# Patient Record
Sex: Female | Born: 1935 | Race: White | Hispanic: No | Smoking: Never smoker
Health system: Southern US, Community
[De-identification: ages and names within clinical notes are randomized; demographics above are authoritative.]

## PROBLEM LIST (undated history)

## (undated) DIAGNOSIS — H269 Unspecified cataract: Secondary | ICD-10-CM

## (undated) DIAGNOSIS — I4891 Unspecified atrial fibrillation: Secondary | ICD-10-CM

## (undated) DIAGNOSIS — I499 Cardiac arrhythmia, unspecified: Secondary | ICD-10-CM

## (undated) DIAGNOSIS — I10 Essential (primary) hypertension: Secondary | ICD-10-CM

## (undated) HISTORY — DX: Unspecified atrial fibrillation: I48.91

## (undated) HISTORY — DX: Unspecified cataract: H26.9

## (undated) HISTORY — PX: TONSILLECTOMY: SUR1361

## (undated) HISTORY — PX: HERNIA REPAIR: SHX51

## (undated) HISTORY — DX: Essential (primary) hypertension: I10

## (undated) HISTORY — PX: EYE SURGERY: SHX253

## (undated) HISTORY — PX: KIDNEY STONE SURGERY: SHX686

## (undated) HISTORY — PX: BREAST BIOPSY: SHX20

---

## 1998-10-15 HISTORY — PX: CATARACT EXTRACTION, BILATERAL: SHX1313

## 1999-06-12 ENCOUNTER — Other Ambulatory Visit: Admission: RE | Admit: 1999-06-12 | Discharge: 1999-06-12 | Payer: Self-pay | Admitting: Obstetrics and Gynecology

## 2002-01-06 ENCOUNTER — Other Ambulatory Visit: Admission: RE | Admit: 2002-01-06 | Discharge: 2002-01-06 | Payer: Self-pay | Admitting: Obstetrics and Gynecology

## 2004-06-13 ENCOUNTER — Other Ambulatory Visit: Admission: RE | Admit: 2004-06-13 | Discharge: 2004-06-13 | Payer: Self-pay | Admitting: Obstetrics and Gynecology

## 2004-12-22 ENCOUNTER — Ambulatory Visit: Payer: Self-pay | Admitting: Internal Medicine

## 2005-01-09 ENCOUNTER — Ambulatory Visit: Payer: Self-pay | Admitting: Internal Medicine

## 2005-01-23 ENCOUNTER — Ambulatory Visit: Payer: Self-pay

## 2005-06-04 ENCOUNTER — Ambulatory Visit: Payer: Self-pay | Admitting: Internal Medicine

## 2008-07-23 ENCOUNTER — Encounter: Payer: Self-pay | Admitting: Internal Medicine

## 2008-07-27 ENCOUNTER — Ambulatory Visit: Payer: Self-pay | Admitting: Internal Medicine

## 2008-07-28 ENCOUNTER — Encounter (INDEPENDENT_AMBULATORY_CARE_PROVIDER_SITE_OTHER): Payer: Self-pay | Admitting: *Deleted

## 2008-07-29 ENCOUNTER — Telehealth (INDEPENDENT_AMBULATORY_CARE_PROVIDER_SITE_OTHER): Payer: Self-pay | Admitting: *Deleted

## 2010-10-18 ENCOUNTER — Encounter: Payer: Self-pay | Admitting: Internal Medicine

## 2010-10-19 ENCOUNTER — Ambulatory Visit
Admission: RE | Admit: 2010-10-19 | Discharge: 2010-10-19 | Payer: Self-pay | Source: Home / Self Care | Attending: Internal Medicine | Admitting: Internal Medicine

## 2010-10-19 DIAGNOSIS — J069 Acute upper respiratory infection, unspecified: Secondary | ICD-10-CM | POA: Insufficient documentation

## 2010-10-19 LAB — CONVERTED CEMR LAB: Cholesterol, target level: 200 mg/dL

## 2010-11-16 NOTE — Assessment & Plan Note (Signed)
Summary: cough//congestion//lch   Vital Signs:  Patient profile:   77 year old female Weight:      133 pounds BMI:     26.21 O2 Sat:      97 % on Room air Temp:     98.0 degrees F oral Pulse rate:   106 / minute Resp:     14 per minute BP sitting:   130 / 82  (left arm) Cuff size:   large  Vitals Entered By: Shonna Chock CMA (October 19, 2010 10:24 AM)  O2 Flow:  Room air CC: Cough and congestion since last Thursday and wheezing , URI symptoms, Lipid Management   CC:  Cough and congestion since last Thursday and wheezing , URI symptoms, and Lipid Management.  Lipid Management History:      Positive NCEP/ATP III risk factors include female age 34 years old or older and hypertension.  Negative NCEP/ATP III risk factors include non-diabetic, no family history for ischemic heart disease, non-tobacco-user status, no ASHD (atherosclerotic heart disease), no prior stroke/TIA, no peripheral vascular disease, and no history of aortic aneurysm.     Preventive Screening-Counseling & Management  Alcohol-Tobacco     Alcohol drinks/day: 0     Smoking Status: never  Caffeine-Diet-Exercise     Caffeine use/day: tea 1-2 daily  Hep-HIV-STD-Contraception     Dental Visit-last 6 months no     Dental Care Counseling: discussed     Sun Exposure-Excessive: no  Safety-Violence-Falls     Seat Belt Use: yes     Smoke Detectors: yes      Blood Transfusions:  no.        Travel History:  [place] 2009.    Allergies: 1)  ! Pcn 2)  ! Sulfa  Social History: Caffeine use/day:  tea 1-2 daily Dental Care w/in 6 mos.:  no Sun Exposure-Excessive:  no Seat Belt Use:  yes Blood Transfusions:  no  Review of Systems  The patient denies anorexia, vision loss, decreased hearing, abdominal pain, melena, hematochezia, severe indigestion/heartburn, hematuria, unusual weight change, abnormal bleeding, enlarged lymph nodes, and angioedema.     Impression & Recommendations:  Lipid Assessment/Plan:     Based on NCEP/ATP III, the patient's risk factor category is "2 or more risk factors and a calculated 10 year CAD risk of > 20%".  The patient's lipid goals are as follows: Total cholesterol goal is 200; LDL cholesterol goal is 130; HDL cholesterol goal is 40; Triglyceride goal is 150.

## 2010-11-16 NOTE — Assessment & Plan Note (Signed)
Summary: URI/scm   Vital Signs:  Patient profile:   77 year old female Weight:      133 pounds O2 Sat:      97 % on Room air Temp:     97 degrees F Pulse rate:   106 / minute BP sitting:   130 / 82  O2 Flow:  Room air  CC:  URI symptoms.  History of Present Illness:      This is an 77 year old woman who presents with  RTI  symptoms; onset 10/10/2010 as pain below R ear..  The patient now  reports purulent nasal discharge and dry cough, but denies earache.  Associated symptoms include wheezing over upper airway.  The patient denies fever.  The patient also reports intermittent frontal headache.  Risk factors for Strep sinusitis include tooth pain and tender adenopathy. No PMH of asthma.   Allergies: 1)  ! Pcn 2)  ! Sulfa  Physical Exam  General:  in no acute distress; alert,appropriate and cooperative throughout examination Ears:  External ear exam shows no significant lesions or deformities.  Otoscopic examination reveals clear canals, tympanic membranes are intact bilaterally without bulging, retraction, inflammation or discharge. Hearing is grossly normal bilaterally. Nose:  External nasal examination shows no deformity or inflammation. Nasal mucosa are  dry  without lesions or exudates.Slight hyponasal speech  Mouth:  Oral mucosa and oropharynx without lesions or exudates.  Teeth in good repair. Lungs:  Normal respiratory effort, chest expands symmetrically. Lungs :scattered musical rhonchi w/o  wheezes. Heart:  regular rhythm, no murmur, no rub, no JVD, and tachycardia.   Cervical Nodes:  No lymphadenopathy noted Axillary Nodes:  No palpable lymphadenopathy   Impression & Recommendations:  Problem # 1:  BRONCHITIS-ACUTE (ICD-466.0)  RAD component  Her updated medication list for this problem includes:    Clarithromycin 500 Mg Xr24h-tab (Clarithromycin) .Marland Kitchen... 2 once daily with food    Hydromet 5-1.5 Mg/79ml Syrp (Hydrocodone-homatropine) .Marland Kitchen... 1 tsp every 6 hrs as  needed  Problem # 2:  UPPER RESPIRATORY INFECTION, ACUTE (ICD-465.9)  Her updated medication list for this problem includes:    Hydromet 5-1.5 Mg/49ml Syrp (Hydrocodone-homatropine) .Marland Kitchen... 1 tsp every 6 hrs as needed  Complete Medication List: 1)  Clarithromycin 500 Mg Xr24h-tab (Clarithromycin) .... 2 once daily with food 2)  Hydromet 5-1.5 Mg/19ml Syrp (Hydrocodone-homatropine) .Marland Kitchen.. 1 tsp every 6 hrs as needed 3)  Prednisone 20 Mg Tabs (Prednisone) .Marland Kitchen.. 1 two times a day with meals  Patient Instructions: 1)  Use samples as instructed. 2)  Drink as much NON dairy fluid as you can tolerate for the next few days. Prescriptions: PREDNISONE 20 MG TABS (PREDNISONE) 1 two times a day with meals  #10 x 0   Entered and Authorized by:   Marga Melnick MD   Signed by:   Marga Melnick MD on 10/23/2010   Method used:   Print then Give to Patient   RxID:   [id number] HYDROMET 5-1.5 MG/5ML SYRP (HYDROCODONE-HOMATROPINE) 1 tsp every 6 hrs as needed  #120 cc x 0   Entered and Authorized by:   Marga Melnick MD   Signed by:   Marga Melnick MD on 10/23/2010   Method used:   Print then Give to Patient   RxID:   [id number] CLARITHROMYCIN 500 MG XR24H-TAB (CLARITHROMYCIN) 2 once daily with food  #20 x 0   Entered and Authorized by:   Marga Melnick MD   Signed by:   Marga Melnick MD on 10/23/2010  Method used:   Print then Give to Patient   RxID:   [id number]    Orders Added: 1)  Est. Patient Level III [14782]

## 2011-06-06 ENCOUNTER — Ambulatory Visit (INDEPENDENT_AMBULATORY_CARE_PROVIDER_SITE_OTHER): Payer: Medicare Other | Admitting: Internal Medicine

## 2011-06-06 ENCOUNTER — Encounter: Payer: Self-pay | Admitting: Internal Medicine

## 2011-06-06 VITALS — BP 118/70 | HR 76 | Temp 97.7°F | Wt 134.8 lb

## 2011-06-06 DIAGNOSIS — M79673 Pain in unspecified foot: Secondary | ICD-10-CM

## 2011-06-06 DIAGNOSIS — M79609 Pain in unspecified limb: Secondary | ICD-10-CM

## 2011-06-06 NOTE — Progress Notes (Signed)
  Subjective:    Patient ID: Ashlee Warren, female    DOB: 1928-02-22, 77 y.o.   MRN: [id number]  HPI Extremity pain Location:R foot @ heel Onset:3-4 weeks ago Trigger/injury:no Pain quality:varaible, sharp - burning Pain severity:up to 10 Duration:up to hours but intermittent Radiation:no Exacerbating factors:standing or walking  Treatment/response:ASA with some benefit; ice increased pain Review of systems: Constitutional: no fever, chills, sweats, change in weight  Musculoskeletal:no  muscle cramps or pain; no  joint stiffness, redness, or swelling Skin:no rash, color change Neuro: no :weakness; incontinence (stool/urine); numbness and tingling Heme:no lymphadenopathy; abnormal bruising or bleeding   Past medical history of reflex sympathetic dystrophy in same leg 15 years ago. She states that her bone density was normal, but the last BMP was 15 years ago. She denies any history of fractures      Review of Systems     Objective:   Physical Exam  she is in no acute distress; she is healthy and well-nourished. She appears younger than stated age.  Deep tendon reflexes are equal bilaterally.  She has minor flexion contracture of 1 right finger.  Strength is good in all extremities  The right great toenail is slightly discolored; otherwise nail health is good  No significant skin lesions are present.  There is full range of motion of both feet/ ankles. Pain can be exacerbated by extreme flexion of the right foot.  There is no tenderness over the Achilles tendon ankle she is tender with compression of the right calcaneus.  Homans sign is negative.  Pedal pulses are excellent with no ischemic changes.        Assessment & Plan:  #1 calcaneus pain, "stone bruise" suggested clinically. Rule out tendinitis  #2 reflex sympathetic dystrophy, past medical history of  Plan: See orders and recommendations. Please consider podiatry or orthopedic foot specialist referral if  you are better with glucosamine.

## 2011-06-06 NOTE — Patient Instructions (Signed)
Consider glucosamine sulfate 1500 mg daily for heel symptoms. Take this daily  for 3 months and then leave it off for 2 months. This will rehydrate the cartilages.  Recommended lifestyle interventions for Osteoporosis include calcium 600 mg twice a day  & vitamin D3 supplementation to keep vit D  level @ least 40-60. The usual vitamin D3 dose is 1000 IU daily; but individual dose is determined by annual vitamin D level monitor. Also weight bearing exercise such as  walking 30-45 minutes 3-4  X per week is recommended. [employer]

## 2011-11-28 DIAGNOSIS — M7730 Calcaneal spur, unspecified foot: Secondary | ICD-10-CM | POA: Diagnosis not present

## 2011-11-28 DIAGNOSIS — L03039 Cellulitis of unspecified toe: Secondary | ICD-10-CM | POA: Diagnosis not present

## 2012-01-18 DIAGNOSIS — H26499 Other secondary cataract, unspecified eye: Secondary | ICD-10-CM | POA: Diagnosis not present

## 2012-04-14 DIAGNOSIS — Z1231 Encounter for screening mammogram for malignant neoplasm of breast: Secondary | ICD-10-CM | POA: Diagnosis not present

## 2012-04-16 DIAGNOSIS — N6489 Other specified disorders of breast: Secondary | ICD-10-CM | POA: Diagnosis not present

## 2012-04-23 DIAGNOSIS — L719 Rosacea, unspecified: Secondary | ICD-10-CM | POA: Diagnosis not present

## 2012-04-23 DIAGNOSIS — L259 Unspecified contact dermatitis, unspecified cause: Secondary | ICD-10-CM | POA: Diagnosis not present

## 2012-04-23 DIAGNOSIS — L570 Actinic keratosis: Secondary | ICD-10-CM | POA: Diagnosis not present

## 2012-04-23 DIAGNOSIS — C44621 Squamous cell carcinoma of skin of unspecified upper limb, including shoulder: Secondary | ICD-10-CM | POA: Diagnosis not present

## 2012-04-24 ENCOUNTER — Encounter: Payer: Self-pay | Admitting: Internal Medicine

## 2012-05-27 DIAGNOSIS — Z85828 Personal history of other malignant neoplasm of skin: Secondary | ICD-10-CM | POA: Diagnosis not present

## 2012-09-29 ENCOUNTER — Encounter (HOSPITAL_COMMUNITY): Payer: Self-pay | Admitting: Emergency Medicine

## 2012-09-29 ENCOUNTER — Emergency Department (HOSPITAL_COMMUNITY): Payer: Medicare Other

## 2012-09-29 ENCOUNTER — Ambulatory Visit (INDEPENDENT_AMBULATORY_CARE_PROVIDER_SITE_OTHER): Payer: Medicare Other | Admitting: Internal Medicine

## 2012-09-29 ENCOUNTER — Emergency Department (HOSPITAL_COMMUNITY)
Admission: EM | Admit: 2012-09-29 | Discharge: 2012-09-30 | Disposition: A | Discharge status: Home / Self Care | Payer: Medicare Other | Attending: Emergency Medicine | Admitting: Emergency Medicine

## 2012-09-29 ENCOUNTER — Ambulatory Visit (INDEPENDENT_AMBULATORY_CARE_PROVIDER_SITE_OTHER)
Admission: RE | Admit: 2012-09-29 | Discharge: 2012-09-29 | Disposition: A | Discharge status: Home / Self Care | Payer: Medicare Other | Source: Ambulatory Visit | Attending: Internal Medicine | Admitting: Internal Medicine

## 2012-09-29 ENCOUNTER — Encounter: Payer: Self-pay | Admitting: Internal Medicine

## 2012-09-29 VITALS — BP 126/80 | HR 60 | Temp 97.8°F | Wt 133.8 lb

## 2012-09-29 DIAGNOSIS — J069 Acute upper respiratory infection, unspecified: Secondary | ICD-10-CM | POA: Diagnosis not present

## 2012-09-29 DIAGNOSIS — R0609 Other forms of dyspnea: Secondary | ICD-10-CM | POA: Insufficient documentation

## 2012-09-29 DIAGNOSIS — R05 Cough: Secondary | ICD-10-CM | POA: Diagnosis not present

## 2012-09-29 DIAGNOSIS — R079 Chest pain, unspecified: Secondary | ICD-10-CM | POA: Diagnosis not present

## 2012-09-29 DIAGNOSIS — I4891 Unspecified atrial fibrillation: Secondary | ICD-10-CM | POA: Diagnosis not present

## 2012-09-29 DIAGNOSIS — R071 Chest pain on breathing: Secondary | ICD-10-CM

## 2012-09-29 DIAGNOSIS — I499 Cardiac arrhythmia, unspecified: Secondary | ICD-10-CM | POA: Diagnosis not present

## 2012-09-29 DIAGNOSIS — R0781 Pleurodynia: Secondary | ICD-10-CM

## 2012-09-29 DIAGNOSIS — R059 Cough, unspecified: Secondary | ICD-10-CM | POA: Diagnosis not present

## 2012-09-29 DIAGNOSIS — R0989 Other specified symptoms and signs involving the circulatory and respiratory systems: Secondary | ICD-10-CM | POA: Insufficient documentation

## 2012-09-29 DIAGNOSIS — J8410 Pulmonary fibrosis, unspecified: Secondary | ICD-10-CM | POA: Diagnosis not present

## 2012-09-29 LAB — CBC WITH DIFFERENTIAL/PLATELET
Basophils Absolute: 0 10*3/uL (ref 0.0–0.1)
Lymphocytes Relative: 32 % (ref 12–46)
Lymphs Abs: 3.2 10*3/uL (ref 0.7–4.0)
MCV: 89.8 fL (ref 78.0–100.0)
Neutro Abs: 5.9 10*3/uL (ref 1.7–7.7)
Neutrophils Relative %: 59 % (ref 43–77)
Platelets: 299 10*3/uL (ref 150–400)
RBC: 4.8 MIL/uL (ref 3.87–5.11)
RDW: 12.5 % (ref 11.5–15.5)
WBC: 10 10*3/uL (ref 4.0–10.5)

## 2012-09-29 LAB — COMPREHENSIVE METABOLIC PANEL
ALT: 16 U/L (ref 0–35)
AST: 20 U/L (ref 0–37)
Alkaline Phosphatase: 91 U/L (ref 39–117)
CO2: 25 mEq/L (ref 19–32)
Calcium: 9.9 mg/dL (ref 8.4–10.5)
Chloride: 102 mEq/L (ref 96–112)
GFR calc Af Amer: 65 mL/min — ABNORMAL LOW (ref >90)
GFR calc non Af Amer: 56 mL/min — ABNORMAL LOW (ref >90)
Glucose, Bld: 82 mg/dL (ref 70–99)
Potassium: 4 mEq/L (ref 3.5–5.1)
Sodium: 138 mEq/L (ref 135–145)
Total Bilirubin: 0.3 mg/dL (ref 0.3–1.2)

## 2012-09-29 LAB — D-DIMER, QUANTITATIVE: D-Dimer, Quant: 0.58 ug/mL-FEU — ABNORMAL HIGH (ref 0.00–0.48)

## 2012-09-29 MED ORDER — RIVAROXABAN 20 MG PO TABS: 20.0000 mg | ORAL_TABLET | Freq: Every day | ORAL | Status: DC

## 2012-09-29 MED ORDER — AZITHROMYCIN 250 MG PO TABS: ORAL_TABLET | ORAL | Status: DC

## 2012-09-29 MED ORDER — SODIUM CHLORIDE 0.9 % IV BOLUS (SEPSIS)
1000.0000 mL | Freq: Once | INTRAVENOUS | Status: AC
  Administered 2012-09-29: 1000 mL via INTRAVENOUS

## 2012-09-29 NOTE — ED Provider Notes (Signed)
History     CSN: [id number]  Arrival date & time 09/29/12  2130   First MD Initiated Contact with Patient 09/29/12 2201      No chief complaint on file.   HPI  the patient presents with new discomfort.  The pain is focally in her left lower anterior ribs.  Pain began 3 days ago, subacutely.  Since onset the pain has been intermittent pain is worse with deep inspiration.  There is no other chest pain.  There is mild associated dyspnea.  The patient was seen at clinic today, sent here after elevated D. dimer. On arrival the patient denies any significant ongoing pain beyond her left lower rib pain.  She denies current lightheadedness, confusion, disorientation, dyspnea.  She denies any lower extremity edema, abdominal pain. She is no cardiac history. Notably, earlier today she was told that she has new atrial fibrillation.  Past Medical History  Diagnosis Date  . H/O atrial fibrillation without current medication     Dr Eden Emms    History reviewed. No pertinent past surgical history.  No family history on file.  History  Substance Use Topics  . Smoking status: Never Smoker   . Smokeless tobacco: Not on file  . Alcohol Use: No    OB History    Grav Para Term Preterm Abortions TAB SAB Ect Mult Living                  Review of Systems  Constitutional:       Per HPI, otherwise negative  HENT:       Per HPI, otherwise negative  Eyes: Negative.   Respiratory:       Per HPI, otherwise negative  Cardiovascular:       Per HPI, otherwise negative  Gastrointestinal: Negative for vomiting.  Genitourinary: Negative.   Musculoskeletal:       Per HPI, otherwise negative  Skin: Negative.   Neurological: Negative for syncope.    Allergies  Penicillins and Sulfonamide derivatives  Home Medications   Current Outpatient Rx  Name  Route  Sig  Dispense  Refill  . AZITHROMYCIN 250 MG PO TABS   Oral   Take 250-500 mg by mouth daily. Patient has zpak. Takes 2 tablets together  on day 1, then 1 tablet daily until gone         . RIVAROXABAN 20 MG PO TABS   Oral   Take 20 mg by mouth daily.           BP 162/80  Pulse 71  Temp 97.9 F (36.6 C) (Oral)  Resp 19  SpO2 94%  Physical Exam  Nursing note and vitals reviewed. Constitutional: She is oriented to person, place, and time. She appears well-developed and well-nourished. No distress.  HENT:  Head: Normocephalic and atraumatic.  Eyes: Conjunctivae normal and EOM are normal.  Cardiovascular: An irregularly irregular rhythm present. Tachycardia present.   Pulmonary/Chest: Effort normal and breath sounds normal. No stridor. No respiratory distress.  Abdominal: She exhibits no distension.  Musculoskeletal: She exhibits no edema.  Neurological: She is alert and oriented to person, place, and time. No cranial nerve deficit.  Skin: Skin is warm and dry.  Psychiatric: She has a normal mood and affect.    ED Course  Procedures (including critical care time)   Labs Reviewed  CBC WITH DIFFERENTIAL  COMPREHENSIVE METABOLIC PANEL  TROPONIN I   Dg Chest 2 View  09/29/2012  *RADIOLOGY REPORT*  Clinical Data: Cough and chest  pain.  CHEST - 2 VIEW  Comparison: 07/27/2008.  Findings: Cardiac silhouette, mediastinal and hilar contours are normal.  There are bronchitic changes in the lungs which could suggest bronchitis.  No definite infiltrates or effusions.  The bony thorax is intact  IMPRESSION: Findings suggest bronchitis with streaky areas of atelectasis but no definite infiltrates.   Original Report Authenticated By: Rudie Meyer, M.D.      No diagnosis found.  Cardiac A. fib 110 abnormal Pulse ox 99% room air normal    Date: 09/29/2012  Rate: 111  Rhythm: atrial fibrillation  QRS Axis: left  Intervals: QT prolonged  ST/T Wave abnormalities: nonspecific ST/T changes  Conduction Disutrbances:left anterior fascicular block  Narrative Interpretation:   Old EKG Reviewed: none  available ABNORMAL  PE scan reviewed / interpreted by me MDM  This patient presents with concerns of left-sided chest pain, that is present from his.  Notably, the patient also has new atrial fibrillation.  The patient's rate is appropriate, and she is oriented quite by her primary care physician.  The patient's CT scan, was negative, and she remained in in no distress throughout her ED stay.  Given that she has a followup appointment tomorrow am, 9 hours from now, she is appropriate for discharge with close outpatient followup       Gerhard Munch, MD 09/30/12 [phone]

## 2012-09-29 NOTE — Progress Notes (Signed)
  Subjective:    Patient ID: Ashlee Warren, female    DOB: September 20, 1928, 78 y.o.   MRN: [id number]  HPI  Symptoms began approximately 10 days ago as a "pulling" sensation in the left inferior thorax intermittently with cough or deep breathing. This was associated with a nonproductive cough which has persisted.  She then developed rhinitis which was subsequently associated with yellow nasal discharge.  She treated herself with the Neti pot and leftover prescription cough syrup    Review of Systems She denied associated extrinsic symptoms of itchy, watery eyes or sneezing. She also denies fever, chills, or sweats. There was no associated frontal headache or facial pain with nasal purulence.  The cough has not been associated with wheezing or shortness of breath     Objective:   Physical Exam General appearance:good health ;well nourished; no acute distress or increased work of breathing is present. Appears younger than stated age . No  lymphadenopathy about the head, neck, or axilla noted.   Eyes: No conjunctival inflammation or lid edema is present.   Ears:  External ear exam shows no significant lesions or deformities.  Otoscopic examination reveals clear canals, tympanic membranes are intact bilaterally without bulging, retraction, inflammation or discharge.  Nose:  External nasal examination shows no deformity or inflammation. Nasal mucosa are pink and moist without lesions or exudates. No septal dislocation or deviation.No obstruction to airflow.   Oral exam: Dental hygiene is good; lips and gums are healthy appearing.There is no oropharyngeal erythema or exudate noted. Palatine osteoma  Neck:  No deformities, masses, or tenderness noted.     Heart:  Irregular  rate and  rhythm. Minor flow  Murmur. No click, rub or other extra sounds.   Lungs:Chest clear to auscultation; no wheezes, rhonchi,rales ,or rubs present.No increased work of breathing. Slight guarding intermittently with  inspiration   Abdomen: LUQ tenderness  Extremities:  No cyanosis, edema, or clubbing  noted . Homan's negative   Skin: Warm & dry           Assessment & Plan:  #1 pleuritic chest pain. In context of #4 embolic phenomena must be considered.  #2 purulent nasal secretions  #3 nonproductive cough  #4 irregular rhythm. EKG reveals atrial fibrillation with tachycardia and unifocal extrasystoles. CHADS 2 score is 1.Bleeding risk = 1. No fall risk.  In view of #1 and number 4 ;Xarelto will be  Initiated & cardiology referral made. She is very stoic individual who would tend to minimize symptoms.   Plan: See orders and recommendations

## 2012-09-29 NOTE — ED Notes (Addendum)
PT. RECEIVED A CALL / ADVISED BY PCP TO GO TO ER FOR FURTHER EVALUATION OF POSSIBLE " BLOOD CLOT IN LUNGS " , SEEN AT [facility] TODAY , BLOOD WORK AND CHEST X-RAY DONE AT CLINIC. DENIES SOB OR CHEST PAIN .

## 2012-09-29 NOTE — Patient Instructions (Addendum)
Order for x-rays entered into  the computer; these will be performed at [address] [facility]. across from [facility]. No appointment is necessary.   If you activate My Chart; the results can be released to you as soon as they populate from the lab. If you choose not to use this program; the labs have to be reviewed, copied & mailed   causing a delay in getting the results to you.  To ER if pain persists or is associaled with Warning Signs as discussed.

## 2012-09-30 ENCOUNTER — Encounter: Payer: Self-pay | Admitting: Cardiology

## 2012-09-30 ENCOUNTER — Ambulatory Visit (INDEPENDENT_AMBULATORY_CARE_PROVIDER_SITE_OTHER): Payer: Medicare Other | Admitting: Cardiology

## 2012-09-30 ENCOUNTER — Telehealth: Payer: Self-pay | Admitting: Internal Medicine

## 2012-09-30 ENCOUNTER — Encounter (HOSPITAL_COMMUNITY): Payer: Self-pay | Admitting: Radiology

## 2012-09-30 ENCOUNTER — Ambulatory Visit (HOSPITAL_COMMUNITY): Payer: Medicare Other | Attending: Cardiovascular Disease | Admitting: Radiology

## 2012-09-30 VITALS — BP 146/88 | HR 98 | Ht 62.0 in | Wt 132.0 lb

## 2012-09-30 DIAGNOSIS — I10 Essential (primary) hypertension: Secondary | ICD-10-CM | POA: Insufficient documentation

## 2012-09-30 DIAGNOSIS — I059 Rheumatic mitral valve disease, unspecified: Secondary | ICD-10-CM | POA: Diagnosis not present

## 2012-09-30 DIAGNOSIS — I4891 Unspecified atrial fibrillation: Secondary | ICD-10-CM | POA: Insufficient documentation

## 2012-09-30 LAB — CBC WITH DIFFERENTIAL/PLATELET
Basophils Absolute: 0.1 10*3/uL (ref 0.0–0.1)
Basophils Relative: 0.5 % (ref 0.0–3.0)
Eosinophils Absolute: 0.2 10*3/uL (ref 0.0–0.7)
Eosinophils Relative: 1.9 % (ref 0.0–5.0)
Hemoglobin: 14.1 g/dL (ref 12.0–15.0)
Lymphocytes Relative: 24 % (ref 12.0–46.0)
MCHC: 33 g/dL (ref 30.0–36.0)
Monocytes Absolute: 0.8 10*3/uL (ref 0.1–1.0)
Neutrophils Relative %: 66.1 % (ref 43.0–77.0)
Platelets: 314 10*3/uL (ref 150.0–400.0)
RBC: 4.74 Mil/uL (ref 3.87–5.11)
WBC: 10.5 10*3/uL (ref 4.5–10.5)

## 2012-09-30 LAB — BASIC METABOLIC PANEL
CO2: 26 mEq/L (ref 19–32)
GFR: 56.34 mL/min — ABNORMAL LOW (ref >60.00)
Sodium: 139 mEq/L (ref 135–145)

## 2012-09-30 MED ORDER — IOHEXOL 350 MG/ML SOLN
100.0000 mL | Freq: Once | INTRAVENOUS | Status: AC | PRN
  Administered 2012-09-30: 100 mL via INTRAVENOUS

## 2012-09-30 MED ORDER — METOPROLOL TARTRATE 25 MG PO TABS: 12.5000 mg | ORAL_TABLET | Freq: Two times a day (BID) | ORAL | Status: DC

## 2012-09-30 MED ORDER — RIVAROXABAN 20 MG PO TABS: 20.0000 mg | ORAL_TABLET | Freq: Every day | ORAL | Status: DC

## 2012-09-30 NOTE — Progress Notes (Signed)
   HPI:  The patient is sent over by Dr. Alwyn Ren for evaluation. 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 Her major complaint was that of some discomfort in the left flank associated with taking a deep breath in. She had a d-dimer performed, and this was elevated and she was therefore sent to the emergency room by Dr. Alvera Novel. A CT scan was performed and revealed no evidence of pulmonary embolus. There was some fibrotic changes in the lungs. Her EKG yesterday demonstrated evidence of atrial fibrillation, and as a result of that she was started on Xarelto by Dr. Alwyn Ren.  She denies any recent bleeding.  Never had a colon exam in the past.  Has been evaluated by Dr. Eden Emms with Holter in the past.  Denies chest pain per se.  No exertional symptoms.     Current Outpatient Prescriptions  Medication Sig Dispense Refill  . azithromycin (ZITHROMAX) 250 MG tablet Take 250-500 mg by mouth daily. Patient has zpak. Takes 2 tablets together on day 1, then 1 tablet daily until gone      . Rivaroxaban (XARELTO) 20 MG TABS Take 20 mg by mouth daily.        Allergies  Allergen Reactions  . Penicillins     Rash R thumb  . Sulfonamide Derivatives     rash    Past Medical History  Diagnosis Date  . H/O atrial fibrillation without current medication     Dr Eden Emms    No past surgical history on file.  No family history on file.  History   Social History  . Marital Status: Single    Spouse Name: N/A    Number of Children: N/A  . Years of Education: N/A   Occupational History  . Not on file.   Social History Main Topics  . Smoking status: Never Smoker   . Smokeless tobacco: Not on file  . Alcohol Use: No  . Drug Use: No  . Sexually Active: Not on file   Other Topics Concern  . Not on file   Social History Narrative  . No narrative on file    ROS: Please see the HPI.  No prior colon.  No history of bleeding.  No increased SOB as of late.  Recent sinus infection.    PHYSICAL  EXAM:  BP 146/88  Pulse 98  Ht 5\' 2"  (1.575 m)  Wt 132 lb (59.875 kg)  BMI 24.14 kg/m2  SpO2 99%  General: Well developed, well nourished, in no acute distress. Head:  Normocephalic and atraumatic. Neck: no JVD.  No carotid bruits.  Some tenderness over first false rib in location of discomfort.   Lungs: Clear to auscultation and percussion. Heart: rapid and irregularly irregular.  No  Def murmur.   Abdomen:  Normal bowel sounds; soft; non tender; no organomegaly Pulses: Pulses normal in all 4 extremities. Extremities: No clubbing or cyanosis. No edema. Neurologic: Alert and oriented x 3.  EKG:  Atrial fib with moderately rapid ventricular response.    ASSESSMENT AND PLAN:

## 2012-09-30 NOTE — ED Notes (Signed)
Pt dc to home. Pt states understanding to dc instructions.  Pt states will f/u with pcp as directed.  Pt ambulatory to exit without difficulty.  Pt denied need for w/c

## 2012-09-30 NOTE — Telephone Encounter (Signed)
[facility] Triage Call Report Triage Record Num: [id number] Operator: Estevan Oaks Patient Name: Ashlee Warren Call Date & Time: 09/29/2012 8:38:25PM Patient Phone: [phone] PCP: Marga Melnick Patient Gender: Female PCP Fax : [phone] Patient DOB: 10-27-1927 Practice Name: [facility] Reason for Call: Caller: Delaney Meigs; PCP: Marga Melnick; CB#: [phone]; Delaney Meigs is calling from [employer] regarding a D Dimer ordered by Marga Melnick. D-dimer 0.58 (0-0.48) Collected today 1602. Rn reviewed chart in Epic. Pt seen today due to cough and intermittent chest pain. Consulted with Dr Debby Bud who advises to have pt go to ED. Rn contacts Mrs Pagan and insctructed to go to [facility] ED. Agrees. Protocol(s) Used: Office Note Recommended Outcome per Protocol: Information Noted and Sent to Office Reason for Outcome: Caller information to office Care Advice: ~

## 2012-09-30 NOTE — Progress Notes (Signed)
Echocardiogram performed.  

## 2012-09-30 NOTE — Telephone Encounter (Signed)
Patient was sent to Emergency room, Dr.Hopper please advise if any further follow-up is needed

## 2012-09-30 NOTE — Telephone Encounter (Signed)
The elevated d-dimer necessitated the CT angiogram. No pulmonary emboli present. Followup should be with cardiology to address the atrial fibrillation and associated risk.Xarelto should be continued until seen by cardiology

## 2012-09-30 NOTE — Patient Instructions (Addendum)
Your physician has recommended you make the following change in your medication: START Metoprolol Tartrate 25mg  take one-half tablet by mouth twice a day  Your physician has requested that you have an echocardiogram. Echocardiography is a painless test that uses sound waves to create images of your heart. It provides your doctor with information about the size and shape of your heart and how well your heart's chambers and valves are working. This procedure takes approximately one hour. There are no restrictions for this procedure.  Your physician recommends that you schedule a follow-up appointment on Friday for a Nurse Visit (BP and EKG)  Your physician recommends that you schedule a follow-up appointment in: 1 WEEK with Dr Riley Kill (10/07/12 at 9:00)

## 2012-09-30 NOTE — Assessment & Plan Note (Signed)
Patient is a moderately rapid ventricular response, and also has elevated blood pressure mildly. Based on this, we will go ahead and start her metoprolol 12.5 mg twice daily. We will get an echocardiogram. Her TSH is currently pending. We will see her back in careful followup. In the interim we will continue her on Zaroxolyn, and it is appropriate dose for her current renal situation. The patient has a CHADS VAsc 2 score of 4 with an annualized risk of 4%.  She has significant increase risk of bleeding and Hgb is normal.  I will see her back in one week.

## 2012-09-30 NOTE — Telephone Encounter (Signed)
Discuss with patient, copy of CT mailed to Pt

## 2012-09-30 NOTE — Assessment & Plan Note (Signed)
Borderline.  Will add low dose beta blockers. Currently on Z pack per Dr. Alwyn Ren.

## 2012-10-03 ENCOUNTER — Ambulatory Visit (INDEPENDENT_AMBULATORY_CARE_PROVIDER_SITE_OTHER): Payer: Medicare Other | Admitting: *Deleted

## 2012-10-03 VITALS — BP 134/72 | HR 78 | Ht 62.0 in | Wt 132.2 lb

## 2012-10-03 DIAGNOSIS — I4891 Unspecified atrial fibrillation: Secondary | ICD-10-CM

## 2012-10-03 NOTE — Progress Notes (Signed)
Pt. In for an EKG and BP check. Pt was started on Xarelto 20 mg once a day and Lopressor 25 mg,   1/2 tablet twice a day medication started on 09/30/12 . Pt took her Lopressor this AM. EKG done per nurse and read per Dr. Jens Som; Afib rate 78 beats/minute BP 134/72 taken manually right arm, also taken with the BP machine 144/76. Pt has no C/O at this time. A 28/20 mg Xarelto  Samples given to pt.

## 2012-10-07 ENCOUNTER — Ambulatory Visit (INDEPENDENT_AMBULATORY_CARE_PROVIDER_SITE_OTHER): Payer: Medicare Other | Admitting: Cardiology

## 2012-10-07 ENCOUNTER — Encounter: Payer: Self-pay | Admitting: Cardiology

## 2012-10-07 VITALS — BP 157/85 | HR 61 | Ht 62.0 in | Wt 131.0 lb

## 2012-10-07 DIAGNOSIS — I10 Essential (primary) hypertension: Secondary | ICD-10-CM

## 2012-10-07 DIAGNOSIS — I4891 Unspecified atrial fibrillation: Secondary | ICD-10-CM

## 2012-10-07 MED ORDER — METOPROLOL TARTRATE 25 MG PO TABS: 25.0000 mg | ORAL_TABLET | Freq: Two times a day (BID) | ORAL | Status: DC

## 2012-10-07 NOTE — Patient Instructions (Addendum)
Your physician has recommended you make the following change in your medication: INCREASE Metoprolol Tartrate to 25mg  take one by mouth twice a day  Your physician recommends that you schedule a follow-up appointment in: 2 WEEKS with Dr Riley Kill

## 2012-10-07 NOTE — Progress Notes (Signed)
   HPI:  The patient returns today in a followup visit. From a cardiac standpoint she appears stable. She says that she is given as a tremendous amount thought, and she is decided that after we cardiovert her she will come off of her anticoagulation after a few weeks. She said she is thought about this carefully. I don't her for many years, and I know that she would do as such.  She denies any chest pain  Current Outpatient Prescriptions  Medication Sig Dispense Refill  . metoprolol tartrate (LOPRESSOR) 25 MG tablet Take 0.5 tablets (12.5 mg total) by mouth 2 (two) times daily.  30 tablet  3  . Rivaroxaban (XARELTO) 20 MG TABS Take 1 tablet (20 mg total) by mouth daily.  30 tablet  6    Allergies  Allergen Reactions  . Penicillins     Rash R thumb  . Sulfonamide Derivatives     rash    Past Medical History  Diagnosis Date  . H/O atrial fibrillation without current medication     Dr Eden Emms    No past surgical history on file.  No family history on file.  History   Social History  . Marital Status: Single    Spouse Name: N/A    Number of Children: N/A  . Years of Education: N/A   Occupational History  . Not on file.   Social History Main Topics  . Smoking status: Never Smoker   . Smokeless tobacco: Not on file  . Alcohol Use: No  . Drug Use: No  . Sexually Active: Not on file   Other Topics Concern  . Not on file   Social History Narrative  . No narrative on file    ROS: Please see the HPI.  All other systems reviewed and negative.  PHYSICAL EXAM:  BP 157/85  Pulse 61  Ht 5\' 2"  (1.575 m)  Wt 131 lb (59.421 kg)  BMI 23.96 kg/m2  SpO2 98%  General: Thin delightful older woman in no distress.   Head:  Normocephalic and atraumatic. Neck: no JVD Lungs: Clear to auscultation and percussion. Heart: irregularly irregular without murmur.  Pulses: Pulses normal in all 4 extremities. Extremities: No clubbing or cyanosis. No edema. Neurologic: Alert and  oriented x 3.  EKG:  Atrial fib with controlled ventricular response  ASSESSMENT AND PLAN:

## 2012-10-10 NOTE — Addendum Note (Signed)
Addended by: Williemae Area on: 10/10/2012 01:26 PM   Modules accepted: Orders

## 2012-10-16 NOTE — Assessment & Plan Note (Signed)
BP is not well controlled at this point.  We will increase her metoprolol dosing .

## 2012-10-16 NOTE — Assessment & Plan Note (Signed)
She has given this a lot of thought, and her goal is to minimize medication to the degree that it is practical.  She has a good grasp of the issues involved, and we have reviewed stroke risks with her.  She is tolerating novel agent treatment, and our goal will be a cardioversion in three weeks.  I will continue to follow her closely.

## 2012-10-20 ENCOUNTER — Ambulatory Visit (INDEPENDENT_AMBULATORY_CARE_PROVIDER_SITE_OTHER): Payer: Medicare Other | Admitting: Cardiology

## 2012-10-20 VITALS — BP 169/87 | HR 76 | Ht 62.0 in | Wt 135.0 lb

## 2012-10-20 DIAGNOSIS — I10 Essential (primary) hypertension: Secondary | ICD-10-CM

## 2012-10-20 DIAGNOSIS — I4891 Unspecified atrial fibrillation: Secondary | ICD-10-CM

## 2012-10-20 NOTE — Assessment & Plan Note (Signed)
O discussion today regarding her situation. He probably would do best to consider long-term anticoagulation. She has a CHADS VAsc2 score of 4, and is at risk for thromboembolic events.  We discussed the alternatives, but would favor continued use of anticoagulation.

## 2012-10-20 NOTE — Patient Instructions (Addendum)
Your physician recommends that you schedule a follow-up appointment in: 1 MONTH   Your physician recommends that you continue on your current medications as directed. Please refer to the Current Medication list given to you today.  

## 2012-10-20 NOTE — Progress Notes (Signed)
   HPI:  She is in for follow up.  We once again had an extensive discussion regarding anticoagulation, rate control, treatment strategy.  She still has a little bit of flank discomfort.  The discomfort is been there for some time, and proceeds her initial office visit with Dr. Alwyn Ren.  Current Outpatient Prescriptions  Medication Sig Dispense Refill  . metoprolol tartrate (LOPRESSOR) 25 MG tablet Take 1 tablet (25 mg total) by mouth 2 (two) times daily.  60 tablet  3  . Rivaroxaban (XARELTO) 20 MG TABS Take 1 tablet (20 mg total) by mouth daily.  30 tablet  6    Allergies  Allergen Reactions  . Penicillins     Rash R thumb  . Sulfonamide Derivatives     rash    Past Medical History  Diagnosis Date  . H/O atrial fibrillation without current medication     Dr Eden Emms    No past surgical history on file.  No family history on file.  History   Social History  . Marital Status: Single    Spouse Name: N/A    Number of Children: N/A  . Years of Education: N/A   Occupational History  . Not on file.   Social History Main Topics  . Smoking status: Never Smoker   . Smokeless tobacco: Not on file  . Alcohol Use: No  . Drug Use: No  . Sexually Active: Not on file   Other Topics Concern  . Not on file   Social History Narrative  . No narrative on file    ROS: Please see the HPI.  All other systems reviewed and negative.  PHYSICAL EXAM:  BP 169/87  Pulse 76  Ht 5\' 2"  (1.575 m)  Wt 135 lb (61.236 kg)  BMI 24.69 kg/m2  SpO2 97%  General: Well developed, well nourished, in no acute distress. Head:  Normocephalic and atraumatic. Neck: no JVD Lungs: Clear to auscultation and percussion. Heart: irregularly irregular rhythm.  .  Extremities: No clubbing or cyanosis. No edema. Neurologic: Alert and oriented x 3.  EKG: Atrial fib with controlled ventricular response.    ASSESSMENT AND PLAN:

## 2012-10-20 NOTE — Assessment & Plan Note (Signed)
The patient's BP is lower than it was, and is well controlled at home, gets higher when seen in office.  Will continue to monitor.

## 2012-11-21 ENCOUNTER — Encounter: Payer: Self-pay | Admitting: Cardiology

## 2012-11-21 ENCOUNTER — Ambulatory Visit (INDEPENDENT_AMBULATORY_CARE_PROVIDER_SITE_OTHER): Payer: Medicare Other | Admitting: Cardiology

## 2012-11-21 VITALS — BP 156/84 | HR 73 | Ht 62.0 in | Wt 136.0 lb

## 2012-11-21 DIAGNOSIS — I10 Essential (primary) hypertension: Secondary | ICD-10-CM | POA: Diagnosis not present

## 2012-11-21 DIAGNOSIS — I4891 Unspecified atrial fibrillation: Secondary | ICD-10-CM | POA: Diagnosis not present

## 2012-11-21 NOTE — Patient Instructions (Addendum)
Your physician recommends that you schedule a follow-up appointment in: MARCH 2014  Your physician recommends that you continue on your current medications as directed. Please refer to the Current Medication list given to you today.  

## 2012-11-21 NOTE — Assessment & Plan Note (Signed)
Her rate remains well controlled. I had a discussion with the patient regarding possible options. She is happy with her lifestyle the present time, and is happy to remain in atrial fibrillation with anticoagulation therapy. We will revisit this with her see her in March

## 2012-11-21 NOTE — Progress Notes (Addendum)
   HPI:  The patient is in for followup and she is doing extremely well. She notices little bit fatigued today, but overall along extremely well. She denies any current symptoms. She's tolerated her medicines, has had no clinical evidence of bleeding.  She is no longer hurting in the flank.  She says her joints do hurt a bit, but overall thinks she is getting along ok.  She has accepted the current treatment plan, albeit with some concern and reluctance about medications  (I expressed to her that I understand)   Current Outpatient Prescriptions  Medication Sig Dispense Refill  . metoprolol tartrate (LOPRESSOR) 25 MG tablet Take 1 tablet (25 mg total) by mouth 2 (two) times daily.  60 tablet  3  . Rivaroxaban (XARELTO) 20 MG TABS Take 1 tablet (20 mg total) by mouth daily.  30 tablet  6    Allergies  Allergen Reactions  . Penicillins     Rash R thumb  . Sulfonamide Derivatives     rash    Past Medical History  Diagnosis Date  . H/O atrial fibrillation without current medication     Dr Eden Emms    No past surgical history on file.  No family history on file.  History   Social History  . Marital Status: Single    Spouse Name: N/A    Number of Children: N/A  . Years of Education: N/A   Occupational History  . Not on file.   Social History Main Topics  . Smoking status: Never Smoker   . Smokeless tobacco: Not on file  . Alcohol Use: No  . Drug Use: No  . Sexually Active: Not on file   Other Topics Concern  . Not on file   Social History Narrative  . No narrative on file    ROS: Please see the HPI.  All other systems reviewed and negative.  PHYSICAL EXAM:  BP 156/84  Pulse 73  Ht 5\' 2"  (1.575 m)  Wt 136 lb (61.689 kg)  BMI 24.87 kg/m2  SpO2 99%  General: Well developed, well nourished, in no acute distress. Head:  Normocephalic and atraumatic. Neck: no JVD Lungs: Clear to auscultation and percussion. Heart: irregularly irregular rhythm.  Well controlled  rate.  No murmur.   Pulses: Pulses normal in all 4 extremities. Extremities: No clubbing or cyanosis. No edema. Neurologic: Alert and oriented x 3.  EKG:  Atrial fib.  Left axis.  Controlled vent response.   ASSESSMENT AND PLAN:

## 2012-11-21 NOTE — Assessment & Plan Note (Signed)
Patient says her blood pressures at home are in the range of 120-140. She thinks her bit higher here today. 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

## 2012-11-29 ENCOUNTER — Other Ambulatory Visit: Payer: Self-pay

## 2012-12-23 ENCOUNTER — Encounter: Payer: Self-pay | Admitting: Cardiology

## 2012-12-23 ENCOUNTER — Ambulatory Visit (INDEPENDENT_AMBULATORY_CARE_PROVIDER_SITE_OTHER): Payer: Medicare Other | Admitting: Cardiology

## 2012-12-23 VITALS — BP 145/83 | HR 74 | Ht 62.0 in | Wt 137.1 lb

## 2012-12-23 DIAGNOSIS — I4891 Unspecified atrial fibrillation: Secondary | ICD-10-CM

## 2012-12-23 DIAGNOSIS — I10 Essential (primary) hypertension: Secondary | ICD-10-CM | POA: Diagnosis not present

## 2012-12-23 LAB — CBC WITH DIFFERENTIAL/PLATELET
Basophils Absolute: 0 10*3/uL (ref 0.0–0.1)
HCT: 40.6 % (ref 36.0–46.0)
Lymphs Abs: 2.4 10*3/uL (ref 0.7–4.0)
MCV: 89.2 fl (ref 78.0–100.0)
Monocytes Absolute: 0.8 10*3/uL (ref 0.1–1.0)
Monocytes Relative: 11.3 % (ref 3.0–12.0)
Neutrophils Relative %: 53.9 % (ref 43.0–77.0)
Platelets: 222 10*3/uL (ref 150.0–400.0)
RDW: 13.3 % (ref 11.5–14.6)

## 2012-12-23 LAB — BASIC METABOLIC PANEL
BUN: 22 mg/dL (ref 6–23)
Creatinine, Ser: 1 mg/dL (ref 0.4–1.2)
GFR: 57.64 mL/min — ABNORMAL LOW (ref >60.00)
Glucose, Bld: 78 mg/dL (ref 70–99)
Potassium: 3.9 mEq/L (ref 3.5–5.1)

## 2012-12-23 MED ORDER — METOPROLOL TARTRATE 25 MG PO TABS: ORAL_TABLET | ORAL | Status: DC

## 2012-12-23 NOTE — Assessment & Plan Note (Signed)
Rate is controlled.  Will get BMET to assess renal function since on Xarelto.  Also CBC.

## 2012-12-23 NOTE — Assessment & Plan Note (Signed)
Will increase metoprolol slightly.

## 2012-12-23 NOTE — Progress Notes (Signed)
   HPI:  The patient returns in follow up.  She provides some BP recordings--p about 85 and BP mildly elevated.  Otherwise she is doing well. Denies any chest pain.  No shortness of breath.    Current Outpatient Prescriptions  Medication Sig Dispense Refill  . metoprolol tartrate (LOPRESSOR) 25 MG tablet Take 1 tablet (25 mg total) by mouth 2 (two) times daily.  60 tablet  3  . Rivaroxaban (XARELTO) 20 MG TABS Take 1 tablet (20 mg total) by mouth daily.  30 tablet  6   No current facility-administered medications for this visit.    Allergies  Allergen Reactions  . Penicillins     Rash R thumb  . Sulfonamide Derivatives     rash    Past Medical History  Diagnosis Date  . H/O atrial fibrillation without current medication     Dr Eden Emms    No past surgical history on file.  No family history on file.  History   Social History  . Marital Status: Single    Spouse Name: N/A    Number of Children: N/A  . Years of Education: N/A   Occupational History  . Not on file.   Social History Main Topics  . Smoking status: Never Smoker   . Smokeless tobacco: Not on file  . Alcohol Use: No  . Drug Use: No  . Sexually Active: Not on file   Other Topics Concern  . Not on file   Social History Narrative  . No narrative on file    ROS: Please see the HPI.  All other systems reviewed and negative.  PHYSICAL EXAM:  BP 145/83  Pulse 74  Ht 5\' 2"  (1.575 m)  Wt 137 lb 1.9 oz (62.197 kg)  BMI 25.07 kg/m2  SpO2 96%  General: Well developed, well nourished, in no acute distress. Head:  Normocephalic and atraumatic. Neck: no JVD Lungs: Clear to auscultation and percussion. Heart: Normal S1 and S2.  Irregularly irregular rhythm.  No def murmur.   Abdomen:  Normal bowel sounds; soft; non tender; no organomegaly Pulses: Pulses normal in all 4 extremities. Extremities: No clubbing or cyanosis. No edema. Neurologic: Alert and oriented x 3.  EKG:  Atrial fib with controlled  ventricular response.  Occasional ectopic beats.    ASSESSMENT AND PLAN:  We will transition her to the care of Dr. Eden Emms.  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

## 2012-12-23 NOTE — Patient Instructions (Addendum)
Your physician recommends that you return for lab work in: today   Your physician has recommended you make the following change in your medication: increase Metoprolol to 1 and 1/2 tablet twice daily  Your physician recommends that you schedule a follow-up appointment in: 6 weeks

## 2013-02-03 ENCOUNTER — Encounter: Payer: Self-pay | Admitting: *Deleted

## 2013-02-03 ENCOUNTER — Ambulatory Visit (INDEPENDENT_AMBULATORY_CARE_PROVIDER_SITE_OTHER): Payer: Medicare Other | Admitting: Cardiovascular Disease

## 2013-02-03 VITALS — BP 151/85 | HR 65 | Ht 62.0 in | Wt 138.0 lb

## 2013-02-03 DIAGNOSIS — I4891 Unspecified atrial fibrillation: Secondary | ICD-10-CM

## 2013-02-03 DIAGNOSIS — I10 Essential (primary) hypertension: Secondary | ICD-10-CM

## 2013-02-03 NOTE — Progress Notes (Signed)
Patient ID: Ashlee Warren, female   DOB: 11-07-1927, 79 y.o.   MRN: [id number] 79 yo patient of [facility]  Chronic afib.  ON xarelto.  Echo 2013 with no atrial enlargement and normal EF without significant valve disease  Long discussion with Ashlee Warren about rate control and anticoagulation or one attempt at Ashlee Warren.  The fact that her echo is good suggests we would be successful in conversion attempt She does not want to take any meds long term especially anticoagulant.  Would consider event monitor post Ashlee Warren and if no afib could stop xarelto 4 weeks post conversion. Risks including CVA discussed.    ROS: Denies fever, malais, weight loss, blurry vision, decreased visual acuity, cough, sputum, SOB, hemoptysis, pleuritic pain, palpitaitons, heartburn, abdominal pain, melena, lower extremity edema, claudication, or rash.  All other systems reviewed and negative  General: Affect appropriate Healthy:  appears stated age HEENT: normal Neck supple with no adenopathy JVP normal no bruits no thyromegaly Lungs clear with no wheezing and good diaphragmatic motion Heart:  S1/S2 no murmur, no rub, gallop or click PMI normal Abdomen: benighn, BS positve, no tenderness, no AAA no bruit.  No HSM or HJR Distal pulses intact with no bruits No edema Neuro non-focal Skin warm and dry No muscular weakness   Current Outpatient Prescriptions  Medication Sig Dispense Refill  . metoprolol tartrate (LOPRESSOR) 25 MG tablet Take 1 and 1/2 tablet twice daily  270 tablet  1  . Rivaroxaban (XARELTO) 20 MG TABS Take 1 tablet (20 mg total) by mouth daily.  30 tablet  6   No current facility-administered medications for this visit.    Allergies  Penicillins and Sulfonamide derivatives  Electrocardiogram:  Assessment and Plan

## 2013-02-03 NOTE — Patient Instructions (Addendum)
Your physician recommends that you schedule a follow-up appointment in:  3 MONTHS WITH  DR NISHAN  Your physician recommends that you continue on your current medications as directed. Please refer to the Current Medication list given to you today.  

## 2013-02-03 NOTE — Assessment & Plan Note (Signed)
Patient will try to decide if she wants [facility] or not Discussed procedure risks and benefits She has been on xarelto for over 3 weeks and will not need TEE.  Continue metroprolol for rate control.

## 2013-02-03 NOTE — Assessment & Plan Note (Signed)
Well controlled.  Continue current medications and low sodium Dash type diet.    

## 2013-03-04 ENCOUNTER — Encounter: Payer: Self-pay | Admitting: Internal Medicine

## 2013-03-04 ENCOUNTER — Ambulatory Visit (INDEPENDENT_AMBULATORY_CARE_PROVIDER_SITE_OTHER): Payer: Medicare Other | Admitting: Internal Medicine

## 2013-03-04 VITALS — BP 138/82 | HR 72 | Temp 97.8°F | Wt 137.4 lb

## 2013-03-04 DIAGNOSIS — I10 Essential (primary) hypertension: Secondary | ICD-10-CM | POA: Diagnosis not present

## 2013-03-04 DIAGNOSIS — I4891 Unspecified atrial fibrillation: Secondary | ICD-10-CM | POA: Diagnosis not present

## 2013-03-04 NOTE — Progress Notes (Signed)
  Subjective:    Patient ID: Ashlee Warren, female    DOB: 12/23/1927, 79 y.o.   MRN: [id number]  HPI  HYPERTENSION follow-up for BP of 172/87 on 03/02/13 in am w/o trigger  Home blood pressure range 133/66- 172/87  Patient is compliant with medications  No adverse effects noted from medication except slight am lightheadedness  High level of exercise  as dancing & aerobic exercise almost daily  for up to 180 minutes  On  low-salt diet             Review of Systems No chest pain, palpitations, dyspnea, claudication,edema or paroxysmal nocturnal dyspnea described. No significant  headache, epistaxis, or syncope.  Late in the evening as she prepares for bed she will notice some coldness in her palms and her soles of her feet for 10-15 minutes. There is no associated change in color of the extremities.      Objective:   Physical Exam Appears healthy and well-nourished & in no acute distress. Appears younger than stated age   No carotid bruits are present.No neck pain distention present at 10 - 15 degrees. Thyroid normal to palpation  Heart rhythm and rate are irregular with no significant murmurs or gallops.  Chest is clear with no increased work of breathing  There is no evidence of aortic aneurysm or renal artery bruits  Abdomen soft with no organomegaly or masses. No HJR  No clubbing, cyanosis or edema present.  Pedal pulses are intact   No ischemic skin changes are present . Nails healthy   Alert and oriented. Strength, tone, DTRs reflexes normal        Assessment & Plan:  #1 HTN #2 AF, options discussed Plan: See  recommendations 

## 2013-03-04 NOTE — Patient Instructions (Addendum)
Minimal Blood Pressure Goal= AVERAGE < 140/90;  Ideal is an AVERAGE < 135/85. This AVERAGE should be calculated from @ least 5-7 BP readings taken @ different times of day on different days of week. You should not respond to isolated BP readings , but rather the AVERAGE for that week .Please bring your  blood pressure cuff to office visits to verify that it is reliable.It  can also be checked against the blood pressure device at the pharmacy.  Protect  hands, feet, and ears from cold exposure including ice chests. Use silk sock & mitten liners; this can be purchased at outdoor supply stores.

## 2013-03-06 ENCOUNTER — Other Ambulatory Visit (INDEPENDENT_AMBULATORY_CARE_PROVIDER_SITE_OTHER): Payer: Medicare Other

## 2013-03-06 ENCOUNTER — Encounter: Payer: Self-pay | Admitting: *Deleted

## 2013-03-06 ENCOUNTER — Telehealth: Payer: Self-pay | Admitting: Cardiovascular Disease

## 2013-03-06 DIAGNOSIS — I4891 Unspecified atrial fibrillation: Secondary | ICD-10-CM

## 2013-03-06 DIAGNOSIS — Z01810 Encounter for preprocedural cardiovascular examination: Secondary | ICD-10-CM | POA: Diagnosis not present

## 2013-03-06 LAB — MAGNESIUM: Magnesium: 2 mg/dL (ref 1.5–2.5)

## 2013-03-06 LAB — BASIC METABOLIC PANEL
CO2: 27 mEq/L (ref 19–32)
Chloride: 105 mEq/L (ref 96–112)
Glucose, Bld: 85 mg/dL (ref 70–99)
Potassium: 4.4 mEq/L (ref 3.5–5.1)
Sodium: 141 mEq/L (ref 135–145)

## 2013-03-06 LAB — CBC WITH DIFFERENTIAL/PLATELET
Eosinophils Relative: 2.2 % (ref 0.0–5.0)
Monocytes Relative: 10 % (ref 3.0–12.0)
Neutrophils Relative %: 58 % (ref 43.0–77.0)
Platelets: 238 10*3/uL (ref 150.0–400.0)
RBC: 4.63 Mil/uL (ref 3.87–5.11)
WBC: 8.9 10*3/uL (ref 4.5–10.5)

## 2013-03-06 NOTE — Telephone Encounter (Signed)
SPOKE WITH  PT REVIEWED LAST OFFICE NOTE PT WAS TO CALL BACK TO SCHEDULE [facility]   PROCEDURE  SCHEDULED FOR  03-11-13  PT HAVING LABS DONE TODAY . WILL GIVE  INSTRUCTIONS  TO PT WHEN  COMES TO HAVE  LABS DONE .Ashlee Warren

## 2013-03-06 NOTE — Telephone Encounter (Signed)
New problem    Seen Dr. Eden Emms on 4/22 .  it was discussed to start the Shot Treatment .patient calling to discuss how to set up that process.

## 2013-03-08 ENCOUNTER — Other Ambulatory Visit: Payer: Self-pay | Admitting: Cardiovascular Disease

## 2013-03-10 ENCOUNTER — Telehealth: Payer: Self-pay | Admitting: Nurse Practitioner

## 2013-03-10 NOTE — Telephone Encounter (Signed)
Called patient to report to her that Dr. Eden Emms advised patient keep appointment on 6/23 for f/u following cardioversion on 5/28.  Patient verbalized understanding.

## 2013-03-10 NOTE — Telephone Encounter (Signed)
Message copied by Levi Aland on Tue Mar 10, 2013 12:17 PM ------      Message from: Wendall Stade      Created: Tue Mar 10, 2013 12:12 PM      Regarding: RE: please advise       Keep that appt for post cardioversion aptt      ----- Message -----         From: Levi Aland, RN         Sent: 03/10/2013  11:15 AM           To: Wendall Stade, MD, Alois Cliche, LPN      Subject: please advise                                            Dr. Eden Emms,            Patient has appointment with you on 6/23 which she states was scheduled before her cardioversion was scheduled.  Cardioversion is 5/28.  Patient wants to know if she should f/u sooner or keep above appointment.  Please advise       ------

## 2013-03-11 ENCOUNTER — Ambulatory Visit (HOSPITAL_COMMUNITY)
Admission: RE | Admit: 2013-03-11 | Discharge: 2013-03-11 | Disposition: A | Discharge status: Home / Self Care | Payer: Medicare Other | Source: Ambulatory Visit | Attending: Cardiovascular Disease | Admitting: Cardiovascular Disease

## 2013-03-11 ENCOUNTER — Encounter (HOSPITAL_COMMUNITY): Payer: Self-pay | Admitting: Anesthesiology

## 2013-03-11 ENCOUNTER — Encounter (HOSPITAL_COMMUNITY)
Admission: RE | Disposition: A | Discharge status: Home / Self Care | Payer: Self-pay | Source: Ambulatory Visit | Attending: Cardiovascular Disease

## 2013-03-11 ENCOUNTER — Encounter (HOSPITAL_COMMUNITY): Payer: Self-pay | Admitting: Gastroenterology

## 2013-03-11 ENCOUNTER — Ambulatory Visit (HOSPITAL_COMMUNITY): Payer: Medicare Other | Admitting: Anesthesiology

## 2013-03-11 DIAGNOSIS — I4891 Unspecified atrial fibrillation: Secondary | ICD-10-CM | POA: Diagnosis not present

## 2013-03-11 DIAGNOSIS — I10 Essential (primary) hypertension: Secondary | ICD-10-CM | POA: Diagnosis not present

## 2013-03-11 HISTORY — DX: Cardiac arrhythmia, unspecified: I49.9

## 2013-03-11 HISTORY — PX: CARDIOVERSION: SHX1299

## 2013-03-11 SURGERY — CARDIOVERSION
Anesthesia: Monitor Anesthesia Care

## 2013-03-11 MED ORDER — LIDOCAINE HCL (CARDIAC) 20 MG/ML IV SOLN
INTRAVENOUS | Status: DC | PRN
  Administered 2013-03-11: 50 mg via INTRAVENOUS

## 2013-03-11 MED ORDER — PROPOFOL 10 MG/ML IV BOLUS
INTRAVENOUS | Status: DC | PRN
  Administered 2013-03-11: 70 mg via INTRAVENOUS

## 2013-03-11 MED ORDER — SODIUM CHLORIDE 0.9 % IV SOLN
INTRAVENOUS | Status: DC | PRN
  Administered 2013-03-11: 13:00:00 via INTRAVENOUS

## 2013-03-11 MED ORDER — SODIUM CHLORIDE 0.9 % IV SOLN
INTRAVENOUS | Status: DC
  Administered 2013-03-11: 500 mL via INTRAVENOUS

## 2013-03-11 NOTE — Preoperative (Signed)
Beta Blockers   Reason not to administer Beta Blockers:Not Applicable 

## 2013-03-11 NOTE — Anesthesia Postprocedure Evaluation (Signed)
  Anesthesia Post-op Note  Patient: Ashlee Warren  Procedure(s) Performed: Procedure(s): CARDIOVERSION (N/A)  Patient Location: Endoscopy Unit  Anesthesia Type:MAC  Level of Consciousness: awake, alert  and oriented  Airway and Oxygen Therapy: Patient Spontanous Breathing  Post-op Pain: none  Post-op Assessment: Post-op Vital signs reviewed, Patient's Cardiovascular Status Stable, Respiratory Function Stable and Patent Airway  Post-op Vital Signs: Reviewed and stable  Complications: No apparent anesthesia complications

## 2013-03-11 NOTE — Interval H&P Note (Signed)
History and Physical Interval Note:  03/11/2013 1:07 PM  Basha Krygier  has presented today for surgery, with the diagnosis of a-fib  The various methods of treatment have been discussed with the patient and family. After consideration of risks, benefits and other options for treatment, the patient has consented to  Procedure(s): CARDIOVERSION (N/A) as a surgical intervention .  The patient's history has been reviewed, patient examined, no change in status, stable for surgery.  I have reviewed the patient's chart and labs.  Questions were answered to the patient's satisfaction.      Charlton Haws

## 2013-03-11 NOTE — H&P (View-Only) (Signed)
  Subjective:    Patient ID: Ashlee Warren, female    DOB: 1928-07-09, 79 y.o.   MRN: [id number]  HPI  HYPERTENSION follow-up for BP of 172/87 on 03/02/13 in am w/o trigger  Home blood pressure range 133/66- 172/87  Patient is compliant with medications  No adverse effects noted from medication except slight am lightheadedness  High level of exercise  as dancing & aerobic exercise almost daily  for up to 180 minutes  On  low-salt diet             Review of Systems No chest pain, palpitations, dyspnea, claudication,edema or paroxysmal nocturnal dyspnea described. No significant  headache, epistaxis, or syncope.  Late in the evening as she prepares for bed she will notice some coldness in her palms and her soles of her feet for 10-15 minutes. There is no associated change in color of the extremities.      Objective:   Physical Exam Appears healthy and well-nourished & in no acute distress. Appears younger than stated age   No carotid bruits are present.No neck pain distention present at 10 - 15 degrees. Thyroid normal to palpation  Heart rhythm and rate are irregular with no significant murmurs or gallops.  Chest is clear with no increased work of breathing  There is no evidence of aortic aneurysm or renal artery bruits  Abdomen soft with no organomegaly or masses. No HJR  No clubbing, cyanosis or edema present.  Pedal pulses are intact   No ischemic skin changes are present . Nails healthy   Alert and oriented. Strength, tone, DTRs reflexes normal        Assessment & Plan:  #1 HTN #2 AF, options discussed Plan: See  recommendations

## 2013-03-11 NOTE — Anesthesia Preprocedure Evaluation (Addendum)
Anesthesia Evaluation  Patient identified by MRN, date of birth, ID band Patient awake    Reviewed: Allergy & Precautions, H&P , NPO status , Patient's Chart, lab work & pertinent test results  Airway Mallampati: II TM Distance: >3 FB Neck ROM: Full    Dental no notable dental hx.    Pulmonary neg pulmonary ROS,  breath sounds clear to auscultation  Pulmonary exam normal       Cardiovascular hypertension, Pt. on medications and Pt. on home beta blockers negative cardio ROS  + dysrhythmias Atrial Fibrillation Rhythm:Regular Rate:Normal     Neuro/Psych negative neurological ROS  negative psych ROS   GI/Hepatic negative GI ROS, Neg liver ROS,   Endo/Other  negative endocrine ROS  Renal/GU negative Renal ROS  negative genitourinary   Musculoskeletal negative musculoskeletal ROS (+)   Abdominal   Peds negative pediatric ROS (+)  Hematology negative hematology ROS (+)   Anesthesia Other Findings   Reproductive/Obstetrics negative OB ROS                          Anesthesia Physical Anesthesia Plan  ASA: III  Anesthesia Plan: MAC   Post-op Pain Management:    Induction: Intravenous  Airway Management Planned: Mask  Additional Equipment:   Intra-op Plan:   Post-operative Plan: Extubation in OR  Informed Consent: I have reviewed the patients History and Physical, chart, labs and discussed the procedure including the risks, benefits and alternatives for the proposed anesthesia with the patient or authorized representative who has indicated his/her understanding and acceptance.   Dental advisory given  Plan Discussed with: Anesthesiologist and Surgeon  Anesthesia Plan Comments:        Anesthesia Quick Evaluation

## 2013-03-11 NOTE — Progress Notes (Signed)
[place]eminy

## 2013-03-11 NOTE — Transfer of Care (Signed)
Immediate Anesthesia Transfer of Care Note  Patient: Ashlee Warren  Procedure(s) Performed: Procedure(s): CARDIOVERSION (N/A)  Patient Location: Endoscopy Unit  Anesthesia Type:MAC  Level of Consciousness: awake, alert  and oriented  Airway & Oxygen Therapy: Patient Spontanous Breathing and Patient connected to nasal cannula oxygen  Post-op Assessment: Report given to PACU RN, Post -op Vital signs reviewed and stable and Patient moving all extremities X 4  Post vital signs: Reviewed and stable  Complications: No apparent anesthesia complications

## 2013-03-11 NOTE — CV Procedure (Signed)
[facility]:  On Rx xerelto  tood meds today  70 mg propofol 50 mg lidocaine  120 J biphasic shock.  Afib rate 105 converted to NSR rate 60 occasional PVC;s No immediate neurologic sequelae  Charlton Haws MD [facility]

## 2013-03-24 ENCOUNTER — Ambulatory Visit (INDEPENDENT_AMBULATORY_CARE_PROVIDER_SITE_OTHER): Payer: Medicare Other

## 2013-03-24 VITALS — BP 184/80 | HR 74 | Ht 62.0 in | Wt 136.5 lb

## 2013-03-24 DIAGNOSIS — I10 Essential (primary) hypertension: Secondary | ICD-10-CM

## 2013-03-24 DIAGNOSIS — I493 Ventricular premature depolarization: Secondary | ICD-10-CM

## 2013-03-24 NOTE — Telephone Encounter (Signed)
New Problem  Pt states she had a cardioversion on May 28th.  She said that it seems like her pulse is going down. Her BP was 111/53 with a pulse of 39.  She said she got up and walked around and her pulse came up to 59.  She wants to know if she should be worried.

## 2013-03-24 NOTE — Progress Notes (Signed)
Patient came to office for EKG which revealed sinus rhythm with frequent pvc's,rate 74 beats /min.Patient stated she did not take lopressor this morning because her electronic machine checked her pulse 39,B/P 111/53.Patient stated after she got up this morning she felt like a fist in center of her chest lasted less than 5 min.Stated she has noticed this feeling in chest after she takes her lopressor.Spoke to DOD Dr.Brackbill he advised to take lopressor as prescribed and keep appointment with Dr.Nishan 04/06/13.Advised to call sooner if needed.

## 2013-03-24 NOTE — Telephone Encounter (Signed)
Returned call to patient she stated she has been feeling bad,weak no energy.States her pulse has been up and down.States this morning when she got up her pulse 39 beats/min.States after she took a shower and walked around house pulse 59 beats/min.Appointment scheduled for a nurse visit today at 11:00 am.

## 2013-04-06 ENCOUNTER — Encounter: Payer: Self-pay | Admitting: Cardiovascular Disease

## 2013-04-06 ENCOUNTER — Ambulatory Visit (INDEPENDENT_AMBULATORY_CARE_PROVIDER_SITE_OTHER): Payer: Medicare Other | Admitting: Cardiovascular Disease

## 2013-04-06 VITALS — BP 122/74 | HR 60 | Ht 62.0 in | Wt 139.8 lb

## 2013-04-06 DIAGNOSIS — I493 Ventricular premature depolarization: Secondary | ICD-10-CM

## 2013-04-06 DIAGNOSIS — Z79899 Other long term (current) drug therapy: Secondary | ICD-10-CM

## 2013-04-06 DIAGNOSIS — I4891 Unspecified atrial fibrillation: Secondary | ICD-10-CM

## 2013-04-06 DIAGNOSIS — I10 Essential (primary) hypertension: Secondary | ICD-10-CM | POA: Diagnosis not present

## 2013-04-06 DIAGNOSIS — I4949 Other premature depolarization: Secondary | ICD-10-CM | POA: Diagnosis not present

## 2013-04-06 MED ORDER — FLECAINIDE ACETATE 50 MG PO TABS: 50.0000 mg | ORAL_TABLET | Freq: Two times a day (BID) | ORAL | Status: DC

## 2013-04-06 NOTE — Patient Instructions (Addendum)
Your physician recommends that you schedule a follow-up appointment in:  2 MONTHS WITH DR [facility] Your physician has recommended you make the following change in your medication:  STOP  XARELTO IN ONE WEEK  STOP METOPROLOL START FLECAINIDE  50 MG 1 TAB TWICE DAILY  Your physician has requested that you have an exercise tolerance test. For further information please visit [email or web address] Please also follow instruction sheet, as given. IN 2 WEEKS

## 2013-04-06 NOTE — Assessment & Plan Note (Signed)
Well controlled.  Continue current medications and low sodium Dash type diet.    

## 2013-04-06 NOTE — Assessment & Plan Note (Signed)
See complex decesion making and med changes proposed in HPI  Add flecainide ETT to r/o proarrhythmia Stop beta blocker for relative bradycardia and fatigue Stop xarelto 6/28 4 weeks post [facility] and start ASA 81mg 

## 2013-04-06 NOTE — Progress Notes (Signed)
Patient ID: Ashlee Warren, female   DOB: 1927-10-30, 79 y.o.   MRN: [id number] 79 yo patient of TS Chronic afib. ON xarelto. Echo 2013 with no atrial enlargement and normal EF without significant valve disease Long discussion with Ashlee Warren about rate control and anticoagulation or one attempt at [facility]. The fact that her echo is good suggests we would be successful in conversion attempt She does not want to take any meds long term especially anticoagulant.   [facility] 5/28 successful  ECG today SB rate 58 atrial bigeminy LAD   Long discussion about options. I think some fatigue is due to relative bradycardia.  I think she is at risk for recurrent afib and has frequent atrial premature beats.  She does not want to be on long term anticoagulation. The best approach would be to stop beta blocker and start antiarrhythmic like flecainide. This would make stopping anticoagulation less dangerous and help with atrial bigemminy. Stopping beta blocker would help with relative bradycardia  She is willing to try this and understands need for ETT on antiarythmic to r/o proarrhythmia  ROS: Denies fever, malais, weight loss, blurry vision, decreased visual acuity, cough, sputum, SOB, hemoptysis, pleuritic pain, palpitaitons, heartburn, abdominal pain, melena, lower extremity edema, claudication, or rash.  All other systems reviewed and negative  General: Affect appropriate Healthy:  appears stated age HEENT: normal Neck supple with no adenopathy JVP normal no bruits no thyromegaly Lungs clear with no wheezing and good diaphragmatic motion Heart:  S1/S2 no murmur, no rub, gallop or click PMI normal Abdomen: benighn, BS positve, no tenderness, no AAA no bruit.  No HSM or HJR Distal pulses intact with no bruits No edema Neuro non-focal Skin warm and dry No muscular weakness   Current Outpatient Prescriptions  Medication Sig Dispense Refill  . metoprolol tartrate (LOPRESSOR) 25 MG tablet Take 1 and 1/2 tablet  twice daily  270 tablet  1  . Rivaroxaban (XARELTO) 20 MG TABS Take 1 tablet (20 mg total) by mouth daily.  30 tablet  6   No current facility-administered medications for this visit.    Allergies  Penicillins and Sulfonamide derivatives  Electrocardiogram:  SR rate 58 atrial bigeminy LAD poor R wave progression  Assessment and Plan

## 2013-04-06 NOTE — Addendum Note (Signed)
Addended by: Scherrie Bateman E on: 04/06/2013 10:21 AM   Modules accepted: Orders

## 2013-04-07 DIAGNOSIS — Z124 Encounter for screening for malignant neoplasm of cervix: Secondary | ICD-10-CM | POA: Diagnosis not present

## 2013-04-20 ENCOUNTER — Encounter (HOSPITAL_COMMUNITY): Payer: Medicare Other

## 2013-04-21 ENCOUNTER — Ambulatory Visit (HOSPITAL_COMMUNITY)
Admission: RE | Admit: 2013-04-21 | Discharge: 2013-04-21 | Disposition: A | Discharge status: Home / Self Care | Payer: Medicare Other | Source: Ambulatory Visit | Attending: Cardiovascular Disease | Admitting: Cardiovascular Disease

## 2013-04-21 ENCOUNTER — Telehealth: Payer: Self-pay

## 2013-04-21 DIAGNOSIS — I4510 Unspecified right bundle-branch block: Secondary | ICD-10-CM | POA: Insufficient documentation

## 2013-04-21 DIAGNOSIS — I4891 Unspecified atrial fibrillation: Secondary | ICD-10-CM | POA: Diagnosis not present

## 2013-04-21 DIAGNOSIS — R002 Palpitations: Secondary | ICD-10-CM | POA: Diagnosis not present

## 2013-04-21 DIAGNOSIS — R5381 Other malaise: Secondary | ICD-10-CM | POA: Insufficient documentation

## 2013-04-21 DIAGNOSIS — Z79899 Other long term (current) drug therapy: Secondary | ICD-10-CM | POA: Diagnosis not present

## 2013-04-21 DIAGNOSIS — I498 Other specified cardiac arrhythmias: Secondary | ICD-10-CM | POA: Insufficient documentation

## 2013-04-21 DIAGNOSIS — R0602 Shortness of breath: Secondary | ICD-10-CM | POA: Insufficient documentation

## 2013-04-21 DIAGNOSIS — I491 Atrial premature depolarization: Secondary | ICD-10-CM | POA: Insufficient documentation

## 2013-04-21 DIAGNOSIS — I10 Essential (primary) hypertension: Secondary | ICD-10-CM | POA: Diagnosis not present

## 2013-04-21 DIAGNOSIS — R5383 Other fatigue: Secondary | ICD-10-CM | POA: Insufficient documentation

## 2013-04-21 NOTE — Telephone Encounter (Signed)
Received call from Crosby PA he advised to call patient and stop Flecainide.Dr.Nishan will be reviewing. Patient called and told to stop Flecainide.

## 2013-04-21 NOTE — Progress Notes (Signed)
Patient underwent outpatient ETT and [name] today. Rest EKG demonstrated NSR, LAD, LAFB and isolated TWI in aVL. The patient was hypertensive (202-216/80s) prior to the ETT. Exercise time 4:31 (stage 2). Max HR 116 (85% of MPHR). BP 211/113. Terminated due to fatigue. EKG changes did reveal NSR/sinus tachycardia, frequent PACs, prolonged QRS vs repolarization changes with exercise, LAFB and new RBBB, in addition to LAD and isolated TWI in aVL. This persisted 10 min into recovery. She was asymptomatic. BP remained < 220/120 and trended down. As this ETT was ordered to evaluated for pro-arrhythmia on flecainide, and given conduction changes (LAFB + new RBBB), reviewed these findings with Dr. Johney Frame who recommended stopping flecainide and transitioning to an alternative agent. The patient will be called with this information. Will forward to Dr. Eden Emms for consideration +/- medication changes on his return.   Jacqulyn Bath, PA-C 04/21/2013 11:51 AM

## 2013-05-01 ENCOUNTER — Telehealth: Payer: Self-pay | Admitting: *Deleted

## 2013-05-01 NOTE — Telephone Encounter (Signed)
with me to discuss tikosyn initiation ----- Message ----- From: Hillis Range, MD Sent: 04/27/2013 8:32 PM To: Wendall Stade, MD Subject: RE: Inpatient Notes I have reviewed ekgs. I think that her QT would be fine for tikosyn. Would avoid multaq/ sotalol/ rhythmol given your concerns of bradycardia. Amiodarone if she fails tikosyn.    Pt has appt on  06-08-13. Is this soon enough ?

## 2013-05-04 NOTE — Telephone Encounter (Signed)
WILL FORWARD TO [facility]  TO REVIEW   APPEARS EARLIEST APPT  WITH  DR ALLRED  IS  06-17-13  ./CY

## 2013-05-04 NOTE — Telephone Encounter (Signed)
Yes  Can she get in with Allred sooner/ ? He is the one that recommended Tikosyn

## 2013-05-14 NOTE — Telephone Encounter (Signed)
She is scheduled for 05/20/13

## 2013-05-19 ENCOUNTER — Encounter: Payer: Self-pay | Admitting: *Deleted

## 2013-05-20 ENCOUNTER — Encounter: Payer: Self-pay | Admitting: Internal Medicine

## 2013-05-20 ENCOUNTER — Other Ambulatory Visit: Payer: Self-pay

## 2013-05-20 ENCOUNTER — Ambulatory Visit (INDEPENDENT_AMBULATORY_CARE_PROVIDER_SITE_OTHER): Payer: Medicare Other | Admitting: Internal Medicine

## 2013-05-20 VITALS — BP 166/88 | HR 85 | Ht 62.0 in | Wt 134.8 lb

## 2013-05-20 DIAGNOSIS — I10 Essential (primary) hypertension: Secondary | ICD-10-CM

## 2013-05-20 DIAGNOSIS — I4891 Unspecified atrial fibrillation: Secondary | ICD-10-CM | POA: Diagnosis not present

## 2013-05-20 MED ORDER — DILTIAZEM HCL 30 MG PO TABS: 30.0000 mg | ORAL_TABLET | Freq: Three times a day (TID) | ORAL | Status: DC

## 2013-05-20 NOTE — Patient Instructions (Addendum)
Your physician recommends that you schedule a follow-up as scheduled with Dr Eden Emms   Your physician has recommended you make the following change in your medication:  1) Stop Aspirin 2) Start Xarelto 20mg  daily 3) Start Diltiazem 30mg  three times daily

## 2013-05-24 NOTE — Progress Notes (Signed)
PCP:  Marga Melnick, MD Primary Cardiologist:  Dr Eden Emms  The patient presents today for further evaluation of afib.  She is followed by Dr Eden Emms.  She was recently placed on flecainide for atrial fibrillation.  Exercise stress testing reveals progressive AV conduction delay and therefore flecainide was discontinued.  She has done reasonably well without recurrence of afib since that time.  She has only occasional palpitations.  Today, she denies symptoms of chest pain, shortness of breath, orthopnea, PND, lower extremity edema, dizziness, presyncope, syncope, or neurologic sequela.  The patient feels that she is tolerating medications without difficulties and is otherwise without complaint today.   Past Medical History  Diagnosis Date  . H/O atrial fibrillation without current medication     Dr Eden Emms  . Essential hypertension   . Dysrhythmia     afib   Past Surgical History  Procedure Laterality Date  . Tonsillectomy    . Cataract extraction, bilateral  2000  . Cardioversion N/A 03/11/2013    Procedure: CARDIOVERSION;  Surgeon: Wendall Stade, MD;  Location: [facility] ENDOSCOPY;  Service: Cardiovascular;  Laterality: N/A;    Current Outpatient Prescriptions  Medication Sig Dispense Refill  . diltiazem (CARDIZEM) 30 MG tablet Take 1 tablet (30 mg total) by mouth 3 (three) times daily.  90 tablet  6  . flecainide (TAMBOCOR) 50 MG tablet Take 1 tablet (50 mg total) by mouth 2 (two) times daily.  60 tablet  11  . Rivaroxaban (XARELTO) 20 MG TABS Take 1 tablet (20 mg total) by mouth daily.  30 tablet  6   No current facility-administered medications for this visit.    Allergies  Allergen Reactions  . Penicillins     Rash R thumb  . Sulfonamide Derivatives     rash    History   Social History  . Marital Status: Married    Spouse Name: N/A    Number of Children: N/A  . Years of Education: N/A   Occupational History  . Not on file.   Social History Main Topics  . Smoking  status: Never Smoker   . Smokeless tobacco: Not on file  . Alcohol Use: No  . Drug Use: No  . Sexually Active: Not on file   Other Topics Concern  . Not on file   Social History Narrative  . No narrative on file    ROS-  All systems are reviewed and are negative except as outlined in the HPI above  Physical Exam: Filed Vitals:   05/20/13 1116  BP: 166/88  Pulse: 85  Height: 5\' 2"  (1.575 m)  Weight: 134 lb 12.8 oz (61.145 kg)  SpO2: 98%    GEN- The patient is well appearing, alert and oriented x 3 today.   Head- normocephalic, atraumatic Eyes-  Sclera clear, conjunctiva pink Ears- hearing intact Oropharynx- clear Neck- supple, no JVP Lymph- no cervical lymphadenopathy Lungs- Clear to ausculation bilaterally, normal work of breathing Heart- Regular rate and rhythm, no murmurs, rubs or gallops, PMI not laterally displaced GI- soft, NT, ND, + BS Extremities- no clubbing, cyanosis, or edema MS- no significant deformity or atrophy Skin- no rash or lesion Psych- euthymic mood, full affect Neuro- strength and sensation are intact  ekg today reveals sinus rhythm with PACs, frequent PVCs, LAHB, LVH  Assessment and Plan:  1. Paroxysmal atrial fibrillation The patient has symptomatic paroxysmal atrial fibrillation.  She is presently doing well off of AAD therapy.  I think that AAD drug options would  include multaq  Or amiodarone.  Her Qt is probably too long for tikosyn.  At this time, she would prefer to avoid AAD therapy.  I will therefore treat with diltiazem for rate control.  She prefers the short acting version due to costs. Her CHADS2VASC score is at least 4.  She therefore require anticoagulation long term.  Today I will stop ASA. Today, I discussed novel anticoagulants including coumadin, pradaxa, xarelto, and eliquis today as indicated for risk reduction in stroke and systemic emboli with nonvalvular atrial fibrillation.  Risks, benefits, and alternatives to each of  these drugs were discussed at length today.  She prefers to return to xarelto.  Though she has difficulty with paying for prescription drugs, she has been able to get xarelto previously.  She may require sample supplementation from our office frequently.  She would benefit from being followed long term in our anticoagulation clinic.  If she cannot afford xarelto then she would need to consider coumadin.  2. HTN Above goal today Repeat by MD is 160/88 I will start diltiazem as above She will follow-up with Dr Eden Emms  I will return her care to Dr Eden Emms and will see her as needed going forward.

## 2013-06-02 DIAGNOSIS — L821 Other seborrheic keratosis: Secondary | ICD-10-CM | POA: Diagnosis not present

## 2013-06-02 DIAGNOSIS — D235 Other benign neoplasm of skin of trunk: Secondary | ICD-10-CM | POA: Diagnosis not present

## 2013-06-02 DIAGNOSIS — Z85828 Personal history of other malignant neoplasm of skin: Secondary | ICD-10-CM | POA: Diagnosis not present

## 2013-06-02 DIAGNOSIS — L570 Actinic keratosis: Secondary | ICD-10-CM | POA: Diagnosis not present

## 2013-06-08 ENCOUNTER — Ambulatory Visit (INDEPENDENT_AMBULATORY_CARE_PROVIDER_SITE_OTHER): Payer: Medicare Other | Admitting: Cardiovascular Disease

## 2013-06-08 ENCOUNTER — Encounter: Payer: Self-pay | Admitting: Cardiovascular Disease

## 2013-06-08 VITALS — BP 164/67 | HR 86 | Wt 135.0 lb

## 2013-06-08 DIAGNOSIS — I4891 Unspecified atrial fibrillation: Secondary | ICD-10-CM

## 2013-06-08 DIAGNOSIS — R0989 Other specified symptoms and signs involving the circulatory and respiratory systems: Secondary | ICD-10-CM | POA: Diagnosis not present

## 2013-06-08 DIAGNOSIS — I10 Essential (primary) hypertension: Secondary | ICD-10-CM

## 2013-06-08 MED ORDER — DILTIAZEM HCL ER COATED BEADS 240 MG PO CP24: 240.0000 mg | ORAL_CAPSULE | Freq: Every day | ORAL | Status: DC

## 2013-06-08 NOTE — Patient Instructions (Addendum)
Your physician recommends that you schedule a follow-up appointment in: NEXT AVAILABLE  HAVE CAROTID SAME DAY Your physician has recommended you make the following change in your medication: START  CARDIZEM CD  240 MG  1  EVERY DAY  ONCE RUN OUT OF  60 MG  Your physician has requested that you have a carotid duplex. This test is an ultrasound of the carotid arteries in your neck. It looks at blood flow through these arteries that supply the brain with blood. Allow one hour for this exam. There are no restrictions or special instructions.

## 2013-06-08 NOTE — Progress Notes (Signed)
Patient ID: Ashlee Warren, female   DOB: [birth year], 79 y.o.   MRN: [id number] 79 yo patient of TS Chronic afib. ON xarelto. Echo 2013 with no atrial enlargement and normal EF without significant valve disease Long discussion with Ashlee Warren about rate control and anticoagulation or one attempt at [facility]. The fact that her echo is good suggests we would be successful in conversion attempt She does not want to take any meds long term especially anticoagulant.  [facility] 5/28 successful   Seen by Ashlee Warren 8/14 and flecainide stopped due to electrical abnormalities on ETT CHADVASC 4 and tolerating xarelto  BP running high at home  Still worries about everything alot  ROS: Denies fever, malais, weight loss, blurry vision, decreased visual acuity, cough, sputum, SOB, hemoptysis, pleuritic pain, palpitaitons, heartburn, abdominal pain, melena, lower extremity edema, claudication, or rash.  All other systems reviewed and negative  General: Affect appropriate Healthy:  appears stated age HEENT: normal Neck supple with no adenopathy JVP normal bilateral  bruits no thyromegaly Lungs clear with no wheezing and good diaphragmatic motion Heart:  S1/S2 no murmur, no rub, gallop or click PMI normal Abdomen: benighn, BS positve, no tenderness, no AAA no bruit.  No HSM or HJR Distal pulses intact with no bruits No edema Neuro non-focal Skin warm and dry No muscular weakness   Current Outpatient Prescriptions  Medication Sig Dispense Refill  . diltiazem (CARDIZEM) 30 MG tablet Take 1 tablet (30 mg total) by mouth 3 (three) times daily.  90 tablet  6  . Rivaroxaban (XARELTO) 20 MG TABS Take 1 tablet (20 mg total) by mouth daily.  30 tablet  6   No current facility-administered medications for this visit.    Allergies  Penicillins and Sulfonamide derivatives  Electrocardiogram: 8/6 SR rate 99 LVH PVC;s   Assessment and Plan

## 2013-06-08 NOTE — Assessment & Plan Note (Signed)
Increase cardizem  F/u home readings

## 2013-06-08 NOTE — Assessment & Plan Note (Signed)
Maint NSR  Xarelto for anticoagulaiton.  Change to long acting cardizem at little higher dose

## 2013-06-08 NOTE — Assessment & Plan Note (Signed)
No recent duplex  Will check with next visit in October

## 2013-06-17 ENCOUNTER — Telehealth: Payer: Self-pay | Admitting: Internal Medicine

## 2013-06-17 NOTE — Telephone Encounter (Signed)
New problem    Patient did not want to disclosed any information will discuss when the nurse called back.

## 2013-06-17 NOTE — Telephone Encounter (Signed)
Samples outfront 

## 2013-07-09 ENCOUNTER — Telehealth: Payer: Self-pay | Admitting: Internal Medicine

## 2013-07-09 NOTE — Telephone Encounter (Signed)
New Problem:  Pt states she is calling in about med samples and would like to speak to [employer]

## 2013-07-09 NOTE — Telephone Encounter (Signed)
Samples out front, patient aware

## 2013-07-28 ENCOUNTER — Ambulatory Visit (HOSPITAL_COMMUNITY): Payer: Medicare Other | Attending: Cardiovascular Disease

## 2013-07-28 ENCOUNTER — Ambulatory Visit (INDEPENDENT_AMBULATORY_CARE_PROVIDER_SITE_OTHER): Payer: Medicare Other | Admitting: Cardiovascular Disease

## 2013-07-28 ENCOUNTER — Encounter: Payer: Self-pay | Admitting: Cardiovascular Disease

## 2013-07-28 VITALS — BP 132/78 | HR 69 | Ht 62.0 in | Wt 137.0 lb

## 2013-07-28 DIAGNOSIS — R0989 Other specified symptoms and signs involving the circulatory and respiratory systems: Secondary | ICD-10-CM | POA: Diagnosis not present

## 2013-07-28 DIAGNOSIS — I6529 Occlusion and stenosis of unspecified carotid artery: Secondary | ICD-10-CM | POA: Diagnosis not present

## 2013-07-28 DIAGNOSIS — I658 Occlusion and stenosis of other precerebral arteries: Secondary | ICD-10-CM | POA: Insufficient documentation

## 2013-07-28 DIAGNOSIS — I10 Essential (primary) hypertension: Secondary | ICD-10-CM | POA: Diagnosis not present

## 2013-07-28 DIAGNOSIS — I4891 Unspecified atrial fibrillation: Secondary | ICD-10-CM | POA: Diagnosis not present

## 2013-07-28 NOTE — Assessment & Plan Note (Signed)
Well controlled.  Continue current medications and low sodium Dash type diet.    

## 2013-07-28 NOTE — Assessment & Plan Note (Signed)
Stable 40-59% bilateral disease /fU duplex in 2 years

## 2013-07-28 NOTE — Patient Instructions (Signed)
Your physician wants you to follow-up in:  6 months. You will receive a reminder letter in the mail two months in advance. If you don't receive a letter, please call our office to schedule the follow-up appointment.   

## 2013-07-28 NOTE — Assessment & Plan Note (Signed)
Maint NSR  Continue cardizem Samples of xarelto and [facility] card given

## 2013-07-28 NOTE — Progress Notes (Signed)
Patient ID: Ashlee Warren, female   DOB: Dec 22, 1927, 79 y.o.   MRN: [id number] 79 yo patient of TS Chronic afib. ON xarelto. Echo 2013 with no atrial enlargement and normal EF without significant valve disease Long discussion with [name] about rate control and anticoagulation or one attempt at [facility]. The fact that her echo is good suggests we would be successful in conversion attempt She does not want to take any meds long term especially anticoagulant.  [facility] 5/28 successful  Seen by Allred 8/14 and flecainide stopped due to electrical abnormalities on ETT  CHADVASC 4 and tolerating xarelto  BP running high at home   Still worries about everything a lot  Known moderate bilateral carotid disease  Reviewed duplex from today Stable 40-59% bilateral disease   ROS: Denies fever, malais, weight loss, blurry vision, decreased visual acuity, cough, sputum, SOB, hemoptysis, pleuritic pain, palpitaitons, heartburn, abdominal pain, melena, lower extremity edema, claudication, or rash.  All other systems reviewed and negative  General: Affect appropriate Healthy:  appears stated age HEENT: normal Neck supple with no adenopathy JVP normal right bruits no thyromegaly Lungs clear with no wheezing and good diaphragmatic motion Heart:  S1/S2 no murmur, no rub, gallop or click PMI normal Abdomen: benighn, BS positve, no tenderness, no AAA no bruit.  No HSM or HJR Distal pulses intact with no bruits No edema Neuro non-focal Skin warm and dry No muscular weakness   Current Outpatient Prescriptions  Medication Sig Dispense Refill  . diltiazem (CARDIZEM CD) 240 MG 24 hr capsule Take 1 capsule (240 mg total) by mouth daily.  90 capsule  3  . Rivaroxaban (XARELTO) 20 MG TABS Take 1 tablet (20 mg total) by mouth daily.  30 tablet  6   No current facility-administered medications for this visit.    Allergies  Penicillins and Sulfonamide derivatives  Electrocardiogram:  SR rate 69  LAD otherwise normal    Assessment and Plan

## 2013-08-20 ENCOUNTER — Other Ambulatory Visit: Payer: Self-pay

## 2013-08-26 ENCOUNTER — Telehealth: Payer: Self-pay | Admitting: Internal Medicine

## 2013-08-26 NOTE — Telephone Encounter (Signed)
Samples outfront 

## 2013-08-26 NOTE — Telephone Encounter (Signed)
New Problem  Pt requests a call back about medications// No further details

## 2013-08-28 ENCOUNTER — Encounter: Payer: Self-pay | Admitting: Family Medicine

## 2013-08-28 ENCOUNTER — Ambulatory Visit (INDEPENDENT_AMBULATORY_CARE_PROVIDER_SITE_OTHER): Payer: Medicare Other | Admitting: Family Medicine

## 2013-08-28 DIAGNOSIS — H6690 Otitis media, unspecified, unspecified ear: Secondary | ICD-10-CM

## 2013-08-28 MED ORDER — AZITHROMYCIN 250 MG PO TABS: ORAL_TABLET | ORAL | Status: DC

## 2013-08-28 NOTE — Assessment & Plan Note (Signed)
New.  Start abx.  Reviewed supportive care and red flags that should prompt return.  Pt expressed understanding and is in agreement w/ plan.  

## 2013-08-28 NOTE — Progress Notes (Signed)
Pre visit review using our clinic review tool, if applicable. No additional management support is needed unless otherwise documented below in the visit note. 

## 2013-08-28 NOTE — Progress Notes (Signed)
  Subjective:    Patient ID: Ashlee Warren, female    DOB: 1927-11-02, 79 y.o.   MRN: [id number]  HPI Pre visit review using our clinic review tool, if applicable. No additional management support is needed unless otherwise documented below in the visit note.  URI- was gardening on Mon, Tues.  Woke Wed w/ nasal congestion.  Now w/ R ear fullness, fatigue.  Tm 99.2.  No facial pain/pressure.  Intermittent cough- productive x2.  No known sick contacts.  No N/V/D.   Review of Systems For ROS see HPI     Objective:   Physical Exam  Vitals reviewed. Constitutional: She appears well-developed and well-nourished. No distress.  HENT:  Head: Normocephalic and atraumatic.  Nose: Nose normal.  Mouth/Throat: Oropharynx is clear and moist. No oropharyngeal exudate.  R TM dull, erythematous, poor landmarks L TM WNL No TTP over sinuses  Neck: Normal range of motion. Neck supple.  Cardiovascular: Normal rate, regular rhythm and normal heart sounds.   Pulmonary/Chest: Effort normal and breath sounds normal. No respiratory distress. She has no wheezes. She has no rales.  Lymphadenopathy:    She has no cervical adenopathy.          Assessment & Plan:

## 2013-08-28 NOTE — Patient Instructions (Signed)
Follow up as needed Start the Zpack as directed for the ear infection Drink plenty of fluids REST! Tylenol as needed for pain/fever Call with any questions or concerns Happy Thanksgiving!

## 2013-09-02 ENCOUNTER — Ambulatory Visit (INDEPENDENT_AMBULATORY_CARE_PROVIDER_SITE_OTHER): Payer: Medicare Other | Admitting: Internal Medicine

## 2013-09-02 ENCOUNTER — Encounter: Payer: Self-pay | Admitting: Internal Medicine

## 2013-09-02 VITALS — BP 152/69 | HR 77 | Temp 98.4°F | Ht 63.0 in | Wt 135.4 lb

## 2013-09-02 DIAGNOSIS — H65199 Other acute nonsuppurative otitis media, unspecified ear: Secondary | ICD-10-CM | POA: Diagnosis not present

## 2013-09-02 DIAGNOSIS — H6980 Other specified disorders of Eustachian tube, unspecified ear: Secondary | ICD-10-CM | POA: Diagnosis not present

## 2013-09-02 DIAGNOSIS — R05 Cough: Secondary | ICD-10-CM

## 2013-09-02 DIAGNOSIS — H6981 Other specified disorders of Eustachian tube, right ear: Secondary | ICD-10-CM

## 2013-09-02 DIAGNOSIS — H748X1 Other specified disorders of right middle ear and mastoid: Secondary | ICD-10-CM

## 2013-09-02 DIAGNOSIS — R059 Cough, unspecified: Secondary | ICD-10-CM | POA: Diagnosis not present

## 2013-09-02 MED ORDER — HYDROCODONE-HOMATROPINE 5-1.5 MG/5ML PO SYRP: 5.0000 mL | ORAL_SOLUTION | Freq: Four times a day (QID) | ORAL | Status: DC | PRN

## 2013-09-02 MED ORDER — PREDNISONE 20 MG PO TABS: 20.0000 mg | ORAL_TABLET | Freq: Two times a day (BID) | ORAL | Status: DC

## 2013-09-02 NOTE — Progress Notes (Signed)
Pre visit review using our clinic review tool, if applicable. No additional management support is needed unless otherwise documented below in the visit note. 

## 2013-09-02 NOTE — Progress Notes (Signed)
  Subjective:    Patient ID: Ashlee Warren, female    DOB: 05-Nov-1927, 79 y.o.   MRN: [id number]  HPI  She has completed a Z-Pak for acute otitis media but has persistent hearing loss in the right ear. She also has an intermittent dry cough. The cough has resulted in some discomfort in her right posterior thorax  She is concerned about musculoskeletal injury from coughing while on the noval oral anticoagulant    Review of Systems  She has no frontal headache, facial pain, or nasal purulence.  She also denies extrinsic symptoms of itchy, watery eyes, or sneezing. She denies any pain or discharge from the ear.  She also has no fever, chills, or sweats.  The cough is not associated with shortness of breath or wheezing.     Objective:   Physical Exam  General appearance:good health ;well nourished; no acute distress or increased work of breathing is present.  No  lymphadenopathy about the head, neck, or axilla noted.   Eyes: No conjunctival inflammation or lid edema is present.   Ears:  External ear exam shows no significant lesions or deformities.  Otoscopic examination reveals clear canals, L tympanic membrane intact  without bulging, retraction, inflammation or discharge. The right tympanic membrane is dull with evidence of hemo-tympaum  Nose:  External nasal examination shows no deformity or inflammation. Nasal mucosa are dry without lesions or exudates. No septal dislocation or deviation.No obstruction to airflow.   Oral exam: Dental hygiene is good; lips and gums are healthy appearing.There is no oropharyngeal erythema or exudate noted.   Neck:  No deformities,  masses, or tenderness noted.     Heart:  Normal rate and regular rhythm. S1 and S2 normal without gallop, murmur, click, rub or other extra sounds.   Lungs:Chest clear to auscultation; no wheezes, rhonchi,rales ,or rubs present.No increased work of breathing.    The area of discomfort is not the thorax but the right  flank underneath the rib cage.  Extremities:  No cyanosis, edema, or clubbing  noted    Skin: Warm & dry .         Assessment & Plan:  #1 hemo-tympanum and probable eustachian tube dysfunction  Plan: See orders

## 2013-09-02 NOTE — Patient Instructions (Addendum)
  Nasacort AQ OTC 1 spray into R  nostril twice a day as needed. Use the "crossover" technique into opposite nostril spraying toward opposite ear @ 45 degree angle, not straight up into nostril.  Plain Allegra (NOT D )  160 daily , Loratidine 10 mg , OR Zyrtec 10 mg @ bedtime  as needed for itchy eyes & sneezing. Go to [facility] for eustachian tube dysfunction. Drink thin  fluids liberally through the day and chew sugarless gum . Do the Valsalva maneuver several times a day to "pop" ears open.  If not well in 4 days,I recommend an ENT consultation to determine optimal therapy.

## 2013-09-16 ENCOUNTER — Telehealth: Payer: Self-pay | Admitting: Internal Medicine

## 2013-09-16 NOTE — Telephone Encounter (Signed)
Please advise.//AB/CMA 

## 2013-09-16 NOTE — Telephone Encounter (Signed)
Patient husband called and states that his wife is still having trouble with her ear and would like to be referred to an ENT.

## 2013-09-17 ENCOUNTER — Other Ambulatory Visit: Payer: Self-pay | Admitting: Internal Medicine

## 2013-09-17 DIAGNOSIS — H6980 Other specified disorders of Eustachian tube, unspecified ear: Secondary | ICD-10-CM

## 2013-09-17 NOTE — Telephone Encounter (Signed)
Done

## 2013-09-24 DIAGNOSIS — H9190 Unspecified hearing loss, unspecified ear: Secondary | ICD-10-CM | POA: Diagnosis not present

## 2013-09-24 DIAGNOSIS — H905 Unspecified sensorineural hearing loss: Secondary | ICD-10-CM | POA: Diagnosis not present

## 2013-09-24 DIAGNOSIS — H93299 Other abnormal auditory perceptions, unspecified ear: Secondary | ICD-10-CM | POA: Diagnosis not present

## 2013-09-30 ENCOUNTER — Telehealth: Payer: Self-pay | Admitting: Internal Medicine

## 2013-09-30 NOTE — Telephone Encounter (Signed)
New problem,    Pt needs samples of  XARELTO please.

## 2013-09-30 NOTE — Telephone Encounter (Signed)
Patient aware samples will be left at the front desk for pick up. 

## 2013-11-11 ENCOUNTER — Telehealth: Payer: Self-pay | Admitting: *Deleted

## 2013-11-11 NOTE — Telephone Encounter (Signed)
Patient requests samples of xarelto, she is aware that they will be left at the front desk for pick up.

## 2013-11-24 ENCOUNTER — Ambulatory Visit (INDEPENDENT_AMBULATORY_CARE_PROVIDER_SITE_OTHER): Payer: Medicare Other | Admitting: Internal Medicine

## 2013-11-24 ENCOUNTER — Encounter: Payer: Self-pay | Admitting: Internal Medicine

## 2013-11-24 VITALS — BP 110/60 | HR 78 | Temp 98.2°F | Ht 63.0 in | Wt 140.0 lb

## 2013-11-24 DIAGNOSIS — R202 Paresthesia of skin: Secondary | ICD-10-CM

## 2013-11-24 DIAGNOSIS — R209 Unspecified disturbances of skin sensation: Secondary | ICD-10-CM

## 2013-11-24 NOTE — Patient Instructions (Signed)
Restrict hyperallergenic foods at this time: Nuts, strawberries, seafood , chocolate, and tomatoes. Zyrtec @ bedtime until symptoms gone.

## 2013-11-24 NOTE — Progress Notes (Signed)
Pre-visit discussion using our clinic review tool. No additional management support is needed unless otherwise documented below in the visit note.  

## 2013-11-24 NOTE — Progress Notes (Signed)
   Subjective:    Patient ID: Ashlee Warren, female    DOB: 1927-10-18, 80 y.o.   MRN: [id number]  HPI   Three weeks ago she began to have constant burning of lower lip, with varying intensity; intermittent upper lip burning; symptoms noted worse at midnight, approximately 2 hours after going to bed. Tingling on sides of tongue, reports some bottom lip swelling and tongue swelling. It is not associated with other extrinsic symptoms.   She is not on an ACE-I. Increased cashew intake in past 3 weeks   Review of Systems Specifically she denies itchy, watery eyes, sneezing.  She denies any cough, judgment, or wheezing  She's had no associated rash or skin lesions  Constitutional symptoms of fever, chills, sweats, or unexplained weight loss also absent.No abnormal bruising or bleeding .      Objective:   Physical Exam Gen.: Healthy and well-nourished in appearance. Alert, appropriate and cooperative throughout exam. Appears younger than stated age. Head: Normocephalic without obvious abnormality.   Eyes: No corneal or conjunctival inflammation noted. Pupils equal round reactive to light and accommodation. Extraocular motion intact.  Ears: External  ear exam reveals no significant lesions or deformities. Canals clear . Left TM scarring noted. Hearing loss bilaterally.  Nose: External nasal exam reveals no deformity or inflammation. Nasal mucosa are pink and moist. No lesions or exudates noted.  Mouth: Oral mucosa and oropharynx reveal no lesions or exudates. Teeth in good repair. Neck: No deformities, masses, or tenderness noted. Range of motion & Thyroid normal. Lungs: Normal respiratory effort; Irregular rhythm. No gallop, click, or rub. No murmur.                                 Musculoskeletal/extremities:  No clubbing, cyanosis, edema, or significant extremity  deformity noted. Tone & strength normal. Hand joints normal  Vascular: Carotid, radial artery pulses are full and equal. No  bruits present. Neurologic: Alert and oriented x3. Deep tendon reflexes symmetrical and normal. No cranial nerve deficit.    Skin: Intact without suspicious lesions or rashes. Lymph: No cervical, axillary lymphadenopathy present. Psych: Mood and affect are normal. Normally interactive                                                                                        Assessment & Plan:  #1 oral paresthesias, probable angioedema variant Plan: see AVS

## 2013-12-14 ENCOUNTER — Telehealth: Payer: Self-pay | Admitting: *Deleted

## 2013-12-14 NOTE — Telephone Encounter (Signed)
Patient requests xarelto samples. I will place at the front desk for pick up.

## 2014-01-13 ENCOUNTER — Telehealth: Payer: Self-pay | Admitting: *Deleted

## 2014-01-13 NOTE — Telephone Encounter (Signed)
Patient requests xarelto samples. I will place at the front desk for pick up.

## 2014-01-20 DIAGNOSIS — H04129 Dry eye syndrome of unspecified lacrimal gland: Secondary | ICD-10-CM | POA: Diagnosis not present

## 2014-01-20 DIAGNOSIS — Z961 Presence of intraocular lens: Secondary | ICD-10-CM | POA: Diagnosis not present

## 2014-01-25 ENCOUNTER — Encounter: Payer: Self-pay | Admitting: Cardiovascular Disease

## 2014-01-25 ENCOUNTER — Ambulatory Visit (INDEPENDENT_AMBULATORY_CARE_PROVIDER_SITE_OTHER): Payer: Medicare Other | Admitting: Cardiovascular Disease

## 2014-01-25 VITALS — BP 120/60 | HR 77 | Ht 63.0 in | Wt 140.4 lb

## 2014-01-25 DIAGNOSIS — I4891 Unspecified atrial fibrillation: Secondary | ICD-10-CM

## 2014-01-25 DIAGNOSIS — R0989 Other specified symptoms and signs involving the circulatory and respiratory systems: Secondary | ICD-10-CM

## 2014-01-25 DIAGNOSIS — I10 Essential (primary) hypertension: Secondary | ICD-10-CM

## 2014-01-25 NOTE — Patient Instructions (Addendum)
Your physician recommends that you continue on your current medications as directed. Please refer to the Current Medication list given to you today.  Your physician wants you to follow-up in: 6 months. You will receive a reminder letter in the mail two months in advance. If you don't receive a letter, please call our office to schedule the follow-up appointment.  

## 2014-01-25 NOTE — Assessment & Plan Note (Signed)
F/U duplex 10/16  40-59% bilateral disease

## 2014-01-25 NOTE — Progress Notes (Signed)
Patient ID: Ashlee Warren, female   DOB: 1928/03/28, 80 y.o.   MRN: [id number] 80 yo patient of TS Chronic afib. ON xarelto. Echo 2013 with no atrial enlargement and normal EF without significant valve disease Long discussion with Ashlee Warren about rate control and anticoagulation or one attempt at [facility]. The fact that her echo is good suggests we would be successful in conversion attempt She does not want to take any meds long term especially anticoagulant.  [facility] 5/28 successful  Seen by [facility] 8/14 and flecainide stopped due to electrical abnormalities on ETT  CHADVASC 4 and tolerating xarelto  BP running high at home  Still worries about everything a lot   Known moderate bilateral carotid disease Reviewed duplex from today Stable 40-59% bilateral  Due for repeat in October      ROS: Denies fever, malais, weight loss, blurry vision, decreased visual acuity, cough, sputum, SOB, hemoptysis, pleuritic pain, palpitaitons, heartburn, abdominal pain, melena, lower extremity edema, claudication, or rash.  All other systems reviewed and negative  General: Affect appropriate Healthy:  appears stated age 55: normal Neck supple with no adenopathy JVP normal bilateral  bruits no thyromegaly Lungs clear with no wheezing and good diaphragmatic motion Heart:  S1/S2 no murmur, no rub, gallop or click PMI normal Abdomen: benighn, BS positve, no tenderness, no AAA no bruit.  No HSM or HJR Distal pulses intact with no bruits No edema Neuro non-focal Skin warm and dry No muscular weakness   Current Outpatient Prescriptions  Medication Sig Dispense Refill  . diltiazem (CARDIZEM CD) 240 MG 24 hr capsule Take 1 capsule (240 mg total) by mouth daily.  90 capsule  3  . Rivaroxaban (XARELTO) 20 MG TABS Take 1 tablet (20 mg total) by mouth daily.  30 tablet  6   No current facility-administered medications for this visit.    Allergies  Penicillins and Sulfonamide derivatives  Electrocardiogram:  SR  rate 69 LAD no change from 2013   Assessment and Plan

## 2014-01-25 NOTE — Assessment & Plan Note (Signed)
Maint NSR no palpitations continue xarelto samples given

## 2014-01-25 NOTE — Assessment & Plan Note (Signed)
Well controlled.  Continue current medications and low sodium Dash type diet.    

## 2014-02-15 ENCOUNTER — Telehealth: Payer: Self-pay

## 2014-02-15 NOTE — Telephone Encounter (Signed)
Patient called for samples of xarelto placed samples up front 

## 2014-03-15 ENCOUNTER — Other Ambulatory Visit: Payer: Self-pay

## 2014-03-15 NOTE — Telephone Encounter (Signed)
Patient called to get samples of xarelto placed samples up front 

## 2014-04-19 ENCOUNTER — Telehealth: Payer: Self-pay | Admitting: *Deleted

## 2014-04-19 NOTE — Telephone Encounter (Signed)
Patient requests xarelto samples. I will place at the front desk for pick p.

## 2014-05-17 ENCOUNTER — Telehealth: Payer: Self-pay

## 2014-05-17 NOTE — Telephone Encounter (Signed)
Patient called for samples of xarelto place them up front

## 2014-06-01 ENCOUNTER — Encounter: Payer: Self-pay | Admitting: Internal Medicine

## 2014-06-01 ENCOUNTER — Ambulatory Visit (INDEPENDENT_AMBULATORY_CARE_PROVIDER_SITE_OTHER): Payer: Medicare Other | Admitting: Internal Medicine

## 2014-06-01 VITALS — BP 140/70 | HR 72 | Temp 98.1°F | Wt 141.5 lb

## 2014-06-01 DIAGNOSIS — B0229 Other postherpetic nervous system involvement: Secondary | ICD-10-CM | POA: Diagnosis not present

## 2014-06-01 DIAGNOSIS — B028 Zoster with other complications: Principal | ICD-10-CM

## 2014-06-01 MED ORDER — FAMCICLOVIR 500 MG PO TABS: 500.0000 mg | ORAL_TABLET | Freq: Three times a day (TID) | ORAL | Status: DC

## 2014-06-01 NOTE — Progress Notes (Signed)
Pre visit review using our clinic review tool, if applicable. No additional management support is needed unless otherwise documented below in the visit note. 

## 2014-06-01 NOTE — Progress Notes (Signed)
   Subjective:    Patient ID: Ashlee Warren, female    DOB: 1927-11-12, 80 y.o.   MRN: [id number]  HPI   Incidentally she noted a vesicular rash at the left anterior scalp line last night. There was no associated burning or tenderness.  She's had no associated constitutional symptoms or ophthalmologic symptoms.  She does have a past history of shingles remotely. She has not had the shingles vaccine    Review of Systems  No associated itchy, watery eyes.  Swelling of the lips or tongue reported intermittently, but not persistently. No ACE-I or ARB therapy.  Shortness of breath, wheezing, or cough absent.  No other rash or urticaria noted.  Fever ,chills , or sweats denied. Purulence absent.  Diarrhea not present.  She does describe nocturia every 1.5 hours at night intermittently but not persistently.  She has chronic hearing loss on the right.  Intermittently she has benign positional vertigo symptoms lasting up to 1 minute. This is unassociated with her present illness.      Objective:   Physical Exam Pertinent or positive findings include: She has 3 vesicular lesions at the left upper anterior scalp line. Extraocular motion is intact as is vision to direct confrontation  General appearance:good health ;well nourished; no acute distress or increased work of breathing is present.  No  lymphadenopathy about the head, neck, or axilla noted.  Eyes: No conjunctival inflammation or lid edema is present. There is no scleral icterus. Ears:  External ear exam shows no significant lesions or deformities.  Otoscopic examination reveals clear canals, tympanic membranes are intact bilaterally without bulging, retraction, inflammation or discharge. Nose:  External nasal examination shows no deformity or inflammation. Nasal mucosa are pink and moist without lesions or exudates. No septal dislocation or deviation.No obstruction to airflow.  Oral exam: Dental hygiene is good; lips and  gums are healthy appearing.There is no oropharyngeal erythema or exudate noted.  Neck:  No deformities, thyromegaly, masses, or tenderness noted.   Supple with full range of motion without pain.  Heart:  Normal rate and regular rhythm. S1 and S2 normal without gallop, murmur, click, rub or other extra sounds.  Lungs:Chest clear to auscultation; no wheezes, rhonchi,rales ,or rubs present.No increased work of breathing.   Extremities:  No cyanosis, edema, or clubbing  noted  Skin: Warm & dry w/o jaundice or tenting.           Assessment & Plan:  #1 cervical herpes zoster  Plan: See orders and recommendations

## 2014-06-01 NOTE — Patient Instructions (Signed)
After having herpes zoster or shingles;  a natural immunity against recurrent shingles should be present for approximately 24 months. After that time shingles immunization would be indicated. It cannot be taken by individuals who are immunocompromised due to HIV or due to active chemotherapy 

## 2014-06-15 ENCOUNTER — Other Ambulatory Visit: Payer: Self-pay | Admitting: *Deleted

## 2014-06-15 MED ORDER — DILTIAZEM HCL ER COATED BEADS 240 MG PO CP24: 240.0000 mg | ORAL_CAPSULE | Freq: Every day | ORAL | Status: DC

## 2014-06-18 ENCOUNTER — Telehealth: Payer: Self-pay

## 2014-06-18 NOTE — Telephone Encounter (Signed)
Call patient to let her know that i placed sample of xarelto up front for her

## 2014-07-15 ENCOUNTER — Telehealth: Payer: Self-pay | Admitting: *Deleted

## 2014-07-15 NOTE — Telephone Encounter (Signed)
Xarelto samples placed at the front desk for patient. 

## 2014-07-27 ENCOUNTER — Ambulatory Visit (INDEPENDENT_AMBULATORY_CARE_PROVIDER_SITE_OTHER): Payer: Medicare Other | Admitting: Cardiovascular Disease

## 2014-07-27 ENCOUNTER — Encounter: Payer: Self-pay | Admitting: Cardiovascular Disease

## 2014-07-27 VITALS — BP 142/60 | HR 74 | Ht 62.0 in | Wt 140.0 lb

## 2014-07-27 DIAGNOSIS — Z79899 Other long term (current) drug therapy: Secondary | ICD-10-CM

## 2014-07-27 DIAGNOSIS — F32A Depression, unspecified: Secondary | ICD-10-CM

## 2014-07-27 DIAGNOSIS — I482 Chronic atrial fibrillation, unspecified: Secondary | ICD-10-CM

## 2014-07-27 DIAGNOSIS — I10 Essential (primary) hypertension: Secondary | ICD-10-CM | POA: Diagnosis not present

## 2014-07-27 DIAGNOSIS — F329 Major depressive disorder, single episode, unspecified: Secondary | ICD-10-CM | POA: Diagnosis not present

## 2014-07-27 NOTE — Progress Notes (Signed)
Patient ID: Ashlee Warren, female   DOB: [birth year], 80 y.o.   MRN: [id number] 80 yo patient of TS Chronic afib. ON xarelto. Echo 2013 with no atrial enlargement and normal EF without significant valve disease Long discussion with Ashlee Warren about rate control and anticoagulation or one attempt at [facility]. The fact that her echo is good suggests we would be successful in conversion attempt She does not want to take any meds long term especially anticoagulant.  [facility] 5/28 successful  Seen by [facility] 8/14 and flecainide stopped due to electrical abnormalities on ETT  CHADVASC 4 and tolerating xarelto  BP running ok at home  Still worries about everything a lot  Known moderate bilateral carotid disease Reviewed duplex from 4/15  Stable 40-59% bilateral Due for repeat in October  She does not like to see her primary care Dr Linna Darner  She has not had blood work since 5/14.  She seemed very agitated today.  Complains about fatigue , exhaustion and vestibular symptoms at night when  She turns over in bed but does not see the need to f/u with primary.  Concerned that she is on too high a dose of cardizem but BP is fine.  Her significant other Ashlee Warren with her today and agrees with me about Her mood and behavior.      ROS: Denies fever, malais, weight loss, blurry vision, decreased visual acuity, cough, sputum, SOB, hemoptysis, pleuritic pain, palpitaitons, heartburn, abdominal pain, melena, lower extremity edema, claudication, or rash.  All other systems reviewed and negative  General: Affect appropriate Healthy:  appears stated age 26: normal Neck supple with no adenopathy JVP normal no bruits no thyromegaly Lungs clear with no wheezing and good diaphragmatic motion Heart:  S1/S2 no murmur, no rub, gallop or click PMI normal Abdomen: benighn, BS positve, no tenderness, no AAA no bruit.  No HSM or HJR Distal pulses intact with no bruits No edema Neuro non-focal Skin warm and dry No muscular  weakness   Current Outpatient Prescriptions  Medication Sig Dispense Refill  . diltiazem (CARDIZEM CD) 240 MG 24 hr capsule Take 1 capsule (240 mg total) by mouth daily.  90 capsule  0  . famciclovir (FAMVIR) 500 MG tablet Take 1 tablet (500 mg total) by mouth 3 (three) times daily.  21 tablet  0  . Rivaroxaban (XARELTO) 20 MG TABS Take 1 tablet (20 mg total) by mouth daily.  30 tablet  6   No current facility-administered medications for this visit.    Allergies  Penicillins and Sulfonamide derivatives  Electrocardiogram: 10/14  SR rate 61 LAD normal  Today NSR rate 74  RBBB LAFB PAC   Assessment and Plan

## 2014-07-27 NOTE — Patient Instructions (Signed)
Your physician recommends that you schedule a follow-up appointment in: AS NEEDED  Your physician recommends that you continue on your current medications as directed. Please refer to the Current Medication list given to you today. Your physician recommends that you return for lab work in: [place]

## 2014-07-28 DIAGNOSIS — F32A Depression, unspecified: Secondary | ICD-10-CM | POA: Insufficient documentation

## 2014-07-28 DIAGNOSIS — F329 Major depressive disorder, single episode, unspecified: Secondary | ICD-10-CM | POA: Insufficient documentation

## 2014-07-28 LAB — BASIC METABOLIC PANEL
BUN: 24 mg/dL — AB (ref 6–23)
CALCIUM: 9.9 mg/dL (ref 8.4–10.5)
CO2: 28 mEq/L (ref 19–32)
Chloride: 107 mEq/L (ref 96–112)
Creatinine, Ser: 1.2 mg/dL (ref 0.4–1.2)
GFR: 47.74 mL/min — ABNORMAL LOW (ref >60.00)
GLUCOSE: 101 mg/dL — AB (ref 70–99)
POTASSIUM: 4 meq/L (ref 3.5–5.1)
Sodium: 142 mEq/L (ref 135–145)

## 2014-07-28 LAB — CBC WITH DIFFERENTIAL/PLATELET
Basophils Absolute: 0 10*3/uL (ref 0.0–0.1)
Basophils Relative: 0.3 % (ref 0.0–3.0)
EOS PCT: 2 % (ref 0.0–5.0)
Eosinophils Absolute: 0.2 10*3/uL (ref 0.0–0.7)
HEMATOCRIT: 41.8 % (ref 36.0–46.0)
Hemoglobin: 13.7 g/dL (ref 12.0–15.0)
LYMPHS ABS: 3 10*3/uL (ref 0.7–4.0)
Lymphocytes Relative: 31.6 % (ref 12.0–46.0)
MCHC: 32.7 g/dL (ref 30.0–36.0)
MCV: 90 fl (ref 78.0–100.0)
MONO ABS: 0.6 10*3/uL (ref 0.1–1.0)
MONOS PCT: 5.8 % (ref 3.0–12.0)
Neutro Abs: 5.8 10*3/uL (ref 1.4–7.7)
Neutrophils Relative %: 60.3 % (ref 43.0–77.0)
Platelets: 304 10*3/uL (ref 150.0–400.0)
RBC: 4.64 Mil/uL (ref 3.87–5.11)
RDW: 13 % (ref 11.5–15.5)
WBC: 9.6 10*3/uL (ref 4.0–10.5)

## 2014-07-28 NOTE — Assessment & Plan Note (Signed)
Some white coat component  But certainly not on too much medicine She wants to minimize all of her drugs but I think current dose cardizem is perfect

## 2014-07-28 NOTE — Assessment & Plan Note (Signed)
Interaction with Ashlee Warren was very hard today.  She insisted on talking about non cardiac problems but didn't see the need to f/u with Dr Linna Darner.  She also seemed mad and agitated from the moment the CMA put her in a room.  Her boyfriend Ashlee Warren agreed with me and she got very mad at him an told him to shut up will send copy of my note to Dr Linna Darner and see if he can arrange f/u to discuss

## 2014-07-28 NOTE — Assessment & Plan Note (Signed)
Maint NSR no palpitations continue cardizem and NOAC  No bleeding issues  Will check CBC/PLT and BMET today

## 2014-08-05 ENCOUNTER — Encounter: Payer: Self-pay | Admitting: Internal Medicine

## 2014-08-05 ENCOUNTER — Telehealth: Payer: Self-pay | Admitting: Internal Medicine

## 2014-08-05 ENCOUNTER — Ambulatory Visit (INDEPENDENT_AMBULATORY_CARE_PROVIDER_SITE_OTHER): Payer: Medicare Other | Admitting: Internal Medicine

## 2014-08-05 VITALS — BP 148/86 | HR 67 | Temp 98.1°F | Wt 140.4 lb

## 2014-08-05 DIAGNOSIS — H9201 Otalgia, right ear: Secondary | ICD-10-CM | POA: Insufficient documentation

## 2014-08-05 NOTE — Progress Notes (Signed)
Pre visit review using our clinic review tool, if applicable. No additional management support is needed unless otherwise documented below in the visit note. 

## 2014-08-05 NOTE — Progress Notes (Signed)
   Subjective:    Patient ID: Ashlee Warren, female    DOB: [birth year], 80 y.o.   MRN: [id number]  HPI   She continues to have pressure and sharp pain in the right ear intermittently. Pain lasts less than 2 seconds. It is nonradiating. She's had some intermittent dizziness as well.  She has had symptoms since November 2014 . She was treated for otitis media at that time. I saw her later that month anddiagnosed eustachian tube dysfunction. I referred her  to Dr. Melida Quitter, ENT. I have no records of his evaluation. Note: upon further chart review the 09/24/13 evaluation by Dr Constance Holster was located under Media. It had been scanned 12/12/13. No active issues besides age related hearing loss diagnosed)    Review of Systems Frontal headache, facial pain , nasal purulence, dental pain, sore throat , otic pain or otic discharge denied. No fever , chills or sweats.     Objective:   Physical Exam Positive for pertinent exam findings include: There is prominence of the tear glands medially. Small osteoma of the hard palate There is decreased hearing bilaterally. Exam with tuning fork is normal bilaterally There is decreased range of motion cervical spine.  General appearance:good health ;well nourished; no acute distress or increased work of breathing is present.  No  lymphadenopathy about the head, neck, or axilla noted. Eyes: No conjunctival inflammation or lid edema is present. There is no scleral icterus. Ears:  External ear exam shows no significant lesions or deformities.  Otoscopic examination reveals clear canals, tympanic membranes are intact bilaterally without bulging, retraction, inflammation or discharge. Nose:  External nasal examination shows no deformity or inflammation. Nasal mucosa are pink and moist without lesions or exudates. No septal dislocation or deviation.No obstruction to airflow.  Oral exam: Dental hygiene is good; lips and gums are healthy appearing.There is no  oropharyngeal erythema or exudate noted.  Neck:  No deformities, thyromegaly, masses, or tenderness noted.    Heart:  Normal rate and regular rhythm. S1 and S2 normal without gallop, murmur, click, rub or other extra sounds.  Lungs:Chest clear to auscultation; no wheezes, rhonchi,rales ,or rubs present.No increased work of breathing.   Extremities:  No cyanosis, edema, or clubbing  noted  Skin: Warm & dry w/o jaundice or tenting.       Assessment & Plan:  #1 R otalgia with no evidence of otitis media  #2 marked hearing loss bilaterally Plan: reasessment of ongoing ear pain by [facility] ENT

## 2014-08-05 NOTE — Telephone Encounter (Signed)
Rec'd from [facility] ENT forward 7 pages to Dr.Hopper

## 2014-08-05 NOTE — Patient Instructions (Signed)
Please sign a release of records to Dr Melida Quitter for records related to ENT evaluation

## 2014-08-06 ENCOUNTER — Telehealth: Payer: Self-pay

## 2014-08-06 NOTE — Telephone Encounter (Signed)
Patient has been advised that referral has been placed to see Dr Constance Holster or someone in his group.

## 2014-08-06 NOTE — Telephone Encounter (Signed)
Message copied by Shelly Coss on Fri Aug 06, 2014  8:32 AM ------      Message from: Hendricks Limes      Created: Fri Aug 06, 2014  7:00 AM       I found  & reviewedENT evaluation done 09/24/13. It was scanned on 12/04/2013 . I missed it yesterday .      Dr Constance Holster has treated me also. I want him or his group to reassess you in view of persistant ear pain ------

## 2014-08-18 ENCOUNTER — Telehealth: Payer: Self-pay

## 2014-08-18 NOTE — Telephone Encounter (Signed)
Patient called to get samples of xarelto I placed a month supply up front

## 2014-08-19 DIAGNOSIS — I10 Essential (primary) hypertension: Secondary | ICD-10-CM | POA: Diagnosis not present

## 2014-08-19 DIAGNOSIS — H8110 Benign paroxysmal vertigo, unspecified ear: Secondary | ICD-10-CM | POA: Diagnosis not present

## 2014-08-19 DIAGNOSIS — R42 Dizziness and giddiness: Secondary | ICD-10-CM | POA: Diagnosis not present

## 2014-09-11 ENCOUNTER — Other Ambulatory Visit: Payer: Self-pay | Admitting: Cardiovascular Disease

## 2014-09-14 ENCOUNTER — Telehealth: Payer: Self-pay

## 2014-09-14 NOTE — Telephone Encounter (Signed)
lmtco about samples of xarelto 20mg  placed up front

## 2014-09-14 NOTE — Telephone Encounter (Signed)
Error

## 2014-10-19 ENCOUNTER — Telehealth: Payer: Self-pay | Admitting: *Deleted

## 2014-10-19 NOTE — Telephone Encounter (Signed)
Xarelto samples placed at the front desk for patient. 

## 2014-12-09 ENCOUNTER — Ambulatory Visit: Payer: Medicare Other | Admitting: Internal Medicine

## 2014-12-09 ENCOUNTER — Encounter: Payer: Self-pay | Admitting: Internal Medicine

## 2014-12-09 ENCOUNTER — Ambulatory Visit (INDEPENDENT_AMBULATORY_CARE_PROVIDER_SITE_OTHER)
Admission: RE | Admit: 2014-12-09 | Discharge: 2014-12-09 | Disposition: A | Discharge status: Home / Self Care | Payer: Medicare Other | Source: Ambulatory Visit | Attending: Internal Medicine | Admitting: Internal Medicine

## 2014-12-09 ENCOUNTER — Ambulatory Visit (INDEPENDENT_AMBULATORY_CARE_PROVIDER_SITE_OTHER): Payer: Medicare Other | Admitting: Internal Medicine

## 2014-12-09 VITALS — BP 128/82 | HR 78 | Temp 98.5°F | Ht 62.0 in | Wt 138.0 lb

## 2014-12-09 DIAGNOSIS — R0689 Other abnormalities of breathing: Secondary | ICD-10-CM

## 2014-12-09 DIAGNOSIS — J209 Acute bronchitis, unspecified: Secondary | ICD-10-CM

## 2014-12-09 DIAGNOSIS — J40 Bronchitis, not specified as acute or chronic: Secondary | ICD-10-CM | POA: Diagnosis not present

## 2014-12-09 DIAGNOSIS — R0989 Other specified symptoms and signs involving the circulatory and respiratory systems: Secondary | ICD-10-CM

## 2014-12-09 MED ORDER — HYDROCODONE-HOMATROPINE 5-1.5 MG/5ML PO SYRP: 5.0000 mL | ORAL_SOLUTION | Freq: Four times a day (QID) | ORAL | Status: DC | PRN

## 2014-12-09 MED ORDER — PREDNISONE 20 MG PO TABS: 20.0000 mg | ORAL_TABLET | Freq: Two times a day (BID) | ORAL | Status: DC

## 2014-12-09 MED ORDER — LEVOFLOXACIN 500 MG PO TABS: 500.0000 mg | ORAL_TABLET | Freq: Every day | ORAL | Status: DC

## 2014-12-09 NOTE — Progress Notes (Signed)
Pre visit review using our clinic review tool, if applicable. No additional management support is needed unless otherwise documented below in the visit note. 

## 2014-12-09 NOTE — Patient Instructions (Signed)
  Your next office appointment will be determined based upon review of your pending  x-rays. Those instructions will be transmitted to you by mail. Critical values will be called. Followup as needed for any active or acute issue. Please report any significant change in your symptoms.

## 2014-12-09 NOTE — Progress Notes (Signed)
   Subjective:    Patient ID: Ashlee Warren, female    DOB: 07-24-1928, 81 y.o.   MRN: [id number]  HPI   Her symptoms began 12/05/14 as a dry cough. As of 2/24 she had profound malaise and anorexia. She stayed in bed the entire day. She had intermittent fever up to 102. She described low back pain and right shoulder pain which she attributed to being bedridden. She's also had frontal headache. Because of her novel oral anticoagulant she's only on Tylenol to treat the fever . She remains exhausted with intermittent fever. She has a rattly nonproductive cough.  Review of Systems  She denies facial pain , nasal purulence, otic pain, otic discharge. The cough has not been associated with wheezing or shortness of breath    Objective:   Physical Exam Pertinent or positive findings include :  She appears profoundly fatigued but in no acute distress.  She has coarse rhonchi in all lung fields without increased work of breathing.  General appearance:Adequately nourished; no acute distress or increased work of breathing is present.  No  lymphadenopathy about the head, neck, or axilla noted.  Eyes: No conjunctival inflammation or lid edema is present. There is no scleral icterus. Ears:  External ear exam shows no significant lesions or deformities.  Otoscopic examination reveals clear canals, tympanic membranes are intact bilaterally without bulging, retraction, inflammation or discharge. Nose:  External nasal examination shows no deformity or inflammation. Nasal mucosa are dry without lesions or exudates. No septal dislocation or deviation.No obstruction to airflow.  Oral exam: Dental hygiene is good; lips and gums are healthy appearing.There is no oropharyngeal erythema or exudate noted.  Neck:  No deformities, thyromegaly, masses, or tenderness noted.   Supple with full range of motion without pain.  Heart:  Normal rate and regular rhythm. S1 and S2 normal without gallop, murmur, click, rub or other  extra sounds.  Extremities:  No cyanosis, edema, or clubbing  noted  Skin: Warm & dry w/o jaundice or tenting.      Assessment & Plan:  #1 acute bronchitis with bronchospasm. R/O CAP  Plan: See orders and recommendations

## 2014-12-16 ENCOUNTER — Telehealth: Payer: Self-pay

## 2014-12-16 NOTE — Telephone Encounter (Signed)
Patient called to get samples of xarelto 20 mg placed samples up front

## 2014-12-24 ENCOUNTER — Ambulatory Visit (INDEPENDENT_AMBULATORY_CARE_PROVIDER_SITE_OTHER): Payer: Medicare Other | Admitting: Family

## 2014-12-24 ENCOUNTER — Encounter: Payer: Self-pay | Admitting: Family

## 2014-12-24 VITALS — BP 148/64 | HR 73 | Temp 97.4°F | Resp 18 | Ht 62.0 in | Wt 138.0 lb

## 2014-12-24 DIAGNOSIS — J209 Acute bronchitis, unspecified: Secondary | ICD-10-CM | POA: Diagnosis not present

## 2014-12-24 DIAGNOSIS — R062 Wheezing: Secondary | ICD-10-CM

## 2014-12-24 MED ORDER — ALBUTEROL SULFATE (2.5 MG/3ML) 0.083% IN NEBU
2.5000 mg | INHALATION_SOLUTION | Freq: Once | RESPIRATORY_TRACT | Status: AC
  Administered 2014-12-24: 2.5 mg via RESPIRATORY_TRACT

## 2014-12-24 MED ORDER — BECLOMETHASONE DIPROPIONATE 40 MCG/ACT IN AERS: 1.0000 | INHALATION_SPRAY | Freq: Two times a day (BID) | RESPIRATORY_TRACT | Status: DC

## 2014-12-24 MED ORDER — ALBUTEROL SULFATE 108 (90 BASE) MCG/ACT IN AEPB: 1.0000 | INHALATION_SPRAY | RESPIRATORY_TRACT | Status: DC | PRN

## 2014-12-24 MED ORDER — ALBUTEROL SULFATE HFA 108 (90 BASE) MCG/ACT IN AERS: 2.0000 | INHALATION_SPRAY | RESPIRATORY_TRACT | Status: DC | PRN

## 2014-12-24 NOTE — Assessment & Plan Note (Signed)
Symptoms and exam consistent with resolving bronchitis. Patient continues to experience bronchospasm and wheezing. She declines prednisone. In office albuterol treatment provided. Patient indicates she felt better following treatment. Start albuterol every 4 hours as needed. Start beclomethasone inhalations daily for the next for 5 days. Follow-up if symptoms worsen or fail to improve.

## 2014-12-24 NOTE — Patient Instructions (Signed)
Thank you for choosing [facility].  Summary/Instructions:  Your prescription(s) have been submitted to your pharmacy or been printed and provided for you. Please take as directed and contact our office if you believe you are having problem(s) with the medication(s) or have any questions.  If your symptoms worsen or fail to improve, please contact our office for further instruction, or in case of emergency go directly to the emergency room at the closest medical facility.    Bronchospasm A bronchospasm is when the tubes that carry air in and out of your lungs (airways) spasm or tighten. During a bronchospasm it is hard to breathe. This is because the airways get smaller. A bronchospasm can be triggered by:  Allergies. These may be to animals, pollen, food, or mold.  Infection. This is a common cause of bronchospasm.  Exercise.  Irritants. These include pollution, cigarette smoke, strong odors, aerosol sprays, and paint fumes.  Weather changes.  Stress.  Being emotional. HOME CARE   Always have a plan for getting help. Know when to call your doctor and local emergency services (911 in the [place] Know where you can get emergency care.  Only take medicines as told by your doctor.  If you were prescribed an inhaler or nebulizer machine, ask your doctor how to use it correctly. Always use a spacer with your inhaler if you were given one.  Stay calm during an attack. Try to relax and breathe more slowly.  Control your home environment:  Change your heating and air conditioning filter at least once a month.  Limit your use of fireplaces and wood stoves.  Do not  smoke. Do not  allow smoking in your home.  Avoid perfumes and fragrances.  Get rid of pests (such as roaches and mice) and their droppings.  Throw away plants if you see mold on them.  Keep your house clean and dust free.  Replace carpet with wood, tile, or vinyl flooring. Carpet can trap dander and  dust.  Use allergy-proof pillows, mattress covers, and box spring covers.  Wash bed sheets and blankets every week in hot water. Dry them in a dryer.  Use blankets that are made of polyester or cotton.  Wash hands frequently. GET HELP IF:  You have muscle aches.  You have chest pain.  The thick spit you spit or cough up (sputum) changes from clear or white to yellow, green, gray, or bloody.  The thick spit you spit or cough up gets thicker.  There are problems that may be related to the medicine you are given such as:  A rash.  Itching.  Swelling.  Trouble breathing. GET HELP RIGHT AWAY IF:  You feel you cannot breathe or catch your breath.  You cannot stop coughing.  Your treatment is not helping you breathe better.  You have very bad chest pain. MAKE SURE YOU:   Understand these instructions.  Will watch your condition.  Will get help right away if you are not doing well or get worse. Document Released: 07/29/2009 Document Revised: 10/06/2013 Document Reviewed: 03/24/2013 [facility] Patient Information 2015 [place], [place]. This information is not intended to replace advice given to you by your health care provider. Make sure you discuss any questions you have with your health care provider.

## 2014-12-24 NOTE — Progress Notes (Signed)
Pre visit review using our clinic review tool, if applicable. No additional management support is needed unless otherwise documented below in the visit note. 

## 2014-12-24 NOTE — Progress Notes (Signed)
   Subjective:    Patient ID: Ashlee Warren, female    DOB: [birth year], 81 y.o.   MRN: [id number]  Chief Complaint  Patient presents with  . Cough    dry cough, wheezing, congestion, SOB, she has been sick since last visit with Dr. Linna Darner    HPI:  Ashlee Warren is a 81 y.o. female who presents today for an acute visit.  This is a continuation of a past problem. Was recently seen 2 weeks ago and diagnosed with bronchitis. She was treated with levaquin, hycodan and prednisone. She continues to experience the associated symptoms of dry cough, wheezing, SOB.    Allergies  Allergen Reactions  . Penicillins     Rash R thumb  . Sulfonamide Derivatives     rash    Current Outpatient Prescriptions on File Prior to Visit  Medication Sig Dispense Refill  . diltiazem (CARDIZEM CD) 240 MG 24 hr capsule TAKE 1 CAPSULE BY MOUTH ONCE DAILY 90 capsule 3  . HYDROcodone-homatropine (HYDROMET) 5-1.5 MG/5ML syrup Take 5 mLs by mouth every 6 (six) hours as needed for cough. 120 mL 0  . Rivaroxaban (XARELTO) 20 MG TABS Take 1 tablet (20 mg total) by mouth daily. 30 tablet 6   No current facility-administered medications on file prior to visit.    Review of Systems  Constitutional: Negative for fever and chills.  HENT: Negative for congestion, sinus pressure and sore throat.   Respiratory: Positive for cough and shortness of breath.   Neurological: Positive for headaches.      Objective:    BP 148/64 mmHg  Pulse 73  Temp(Src) 97.4 F (36.3 C) (Oral)  Resp 18  Ht 5\' 2"  (1.575 m)  Wt 138 lb (62.596 kg)  BMI 25.23 kg/m2  SpO2 97% Nursing note and vital signs reviewed.  Physical Exam  Constitutional: She is oriented to person, place, and time. She appears well-developed and well-nourished. No distress.  HENT:  Right Ear: Hearing, tympanic membrane, external ear and ear canal normal.  Left Ear: Hearing, tympanic membrane, external ear and ear canal normal.  Nose: Nose normal. Right  sinus exhibits no maxillary sinus tenderness and no frontal sinus tenderness. Left sinus exhibits no maxillary sinus tenderness and no frontal sinus tenderness.  Mouth/Throat: Uvula is midline, oropharynx is clear and moist and mucous membranes are normal.  Cardiovascular: Normal rate, regular rhythm, normal heart sounds and intact distal pulses.   Pulmonary/Chest: Effort normal. She has wheezes.  Neurological: She is alert and oriented to person, place, and time.  Skin: Skin is warm and dry.  Psychiatric: She has a normal mood and affect. Her behavior is normal. Judgment and thought content normal.       Assessment & Plan:

## 2015-01-14 ENCOUNTER — Telehealth: Payer: Self-pay

## 2015-01-14 NOTE — Telephone Encounter (Signed)
Patient called for samples of xarelto placed at the front desk

## 2015-01-17 DIAGNOSIS — Z1231 Encounter for screening mammogram for malignant neoplasm of breast: Secondary | ICD-10-CM | POA: Diagnosis not present

## 2015-02-03 ENCOUNTER — Encounter: Payer: Self-pay | Admitting: Internal Medicine

## 2015-02-10 ENCOUNTER — Telehealth: Payer: Self-pay

## 2015-02-10 NOTE — Telephone Encounter (Signed)
Patient called for samples of xarelto placed samples up front 

## 2015-03-16 DIAGNOSIS — L820 Inflamed seborrheic keratosis: Secondary | ICD-10-CM | POA: Diagnosis not present

## 2015-03-16 DIAGNOSIS — L570 Actinic keratosis: Secondary | ICD-10-CM | POA: Diagnosis not present

## 2015-03-16 DIAGNOSIS — X32XXXD Exposure to sunlight, subsequent encounter: Secondary | ICD-10-CM | POA: Diagnosis not present

## 2015-04-11 DIAGNOSIS — Z124 Encounter for screening for malignant neoplasm of cervix: Secondary | ICD-10-CM | POA: Diagnosis not present

## 2015-04-11 DIAGNOSIS — L904 Acrodermatitis chronica atrophicans: Secondary | ICD-10-CM | POA: Diagnosis not present

## 2015-04-11 DIAGNOSIS — N762 Acute vulvitis: Secondary | ICD-10-CM | POA: Diagnosis not present

## 2015-04-11 DIAGNOSIS — Z6825 Body mass index (BMI) 25.0-25.9, adult: Secondary | ICD-10-CM | POA: Diagnosis not present

## 2015-04-21 ENCOUNTER — Telehealth: Payer: Self-pay | Admitting: *Deleted

## 2015-04-21 NOTE — Telephone Encounter (Signed)
Xarelto samples provided to patient.

## 2015-06-24 ENCOUNTER — Telehealth: Payer: Self-pay | Admitting: *Deleted

## 2015-06-24 NOTE — Telephone Encounter (Signed)
Xarelto samples placed at the front desk for patient. 

## 2015-07-06 ENCOUNTER — Telehealth: Payer: Self-pay | Admitting: Cardiovascular Disease

## 2015-07-06 DIAGNOSIS — R0989 Other specified symptoms and signs involving the circulatory and respiratory systems: Secondary | ICD-10-CM

## 2015-07-06 DIAGNOSIS — I4891 Unspecified atrial fibrillation: Secondary | ICD-10-CM

## 2015-07-06 DIAGNOSIS — I10 Essential (primary) hypertension: Secondary | ICD-10-CM

## 2015-07-06 NOTE — Telephone Encounter (Signed)
New Message   Pt wants to know if she should come in for any Lab work

## 2015-07-06 NOTE — Telephone Encounter (Signed)
Pt would like to know when she needs to have blood work to check for the Xarelto medication she has been taken. Pt's last blood work was on 07/27/14. Pt also asked if she is to have a Carotid Duplex. Last Carotid doppler was done on 07/30/13. [name] recommended to repeat The carotid duplex in 2 years. Test was order in epic for October 2016.

## 2015-07-06 NOTE — Telephone Encounter (Signed)
yes CBC/PLT and BMET.  Carotid in October

## 2015-07-07 ENCOUNTER — Other Ambulatory Visit (INDEPENDENT_AMBULATORY_CARE_PROVIDER_SITE_OTHER): Payer: Medicare Other

## 2015-07-07 DIAGNOSIS — I10 Essential (primary) hypertension: Secondary | ICD-10-CM

## 2015-07-07 DIAGNOSIS — I4891 Unspecified atrial fibrillation: Secondary | ICD-10-CM | POA: Diagnosis not present

## 2015-07-07 LAB — CBC WITH DIFFERENTIAL/PLATELET
BASOS PCT: 0.3 % (ref 0.0–3.0)
Basophils Absolute: 0 10*3/uL (ref 0.0–0.1)
Eosinophils Absolute: 0.2 10*3/uL (ref 0.0–0.7)
Eosinophils Relative: 2.1 % (ref 0.0–5.0)
HEMATOCRIT: 42.6 % (ref 36.0–46.0)
Hemoglobin: 14.2 g/dL (ref 12.0–15.0)
LYMPHS PCT: 26.6 % (ref 12.0–46.0)
Lymphs Abs: 2.2 10*3/uL (ref 0.7–4.0)
MCHC: 33.3 g/dL (ref 30.0–36.0)
MCV: 87.3 fl (ref 78.0–100.0)
MONOS PCT: 8.4 % (ref 3.0–12.0)
Monocytes Absolute: 0.7 10*3/uL (ref 0.1–1.0)
NEUTROS ABS: 5.2 10*3/uL (ref 1.4–7.7)
Neutrophils Relative %: 62.6 % (ref 43.0–77.0)
PLATELETS: 286 10*3/uL (ref 150.0–400.0)
RBC: 4.88 Mil/uL (ref 3.87–5.11)
RDW: 13.4 % (ref 11.5–15.5)
WBC: 8.3 10*3/uL (ref 4.0–10.5)

## 2015-07-07 LAB — BASIC METABOLIC PANEL
BUN: 19 mg/dL (ref 6–23)
CHLORIDE: 106 meq/L (ref 96–112)
CO2: 27 mEq/L (ref 19–32)
CREATININE: 1.06 mg/dL (ref 0.40–1.20)
Calcium: 9.9 mg/dL (ref 8.4–10.5)
GFR: 52.33 mL/min — ABNORMAL LOW (ref >60.00)
Glucose, Bld: 95 mg/dL (ref 70–99)
Potassium: 4.1 mEq/L (ref 3.5–5.1)
Sodium: 141 mEq/L (ref 135–145)

## 2015-07-07 NOTE — Telephone Encounter (Signed)
Pt is aware of labs, and carotid in October. Appointment was made for labs today. Pt is aware.

## 2015-07-28 ENCOUNTER — Telehealth: Payer: Self-pay | Admitting: Cardiovascular Disease

## 2015-07-28 NOTE — Telephone Encounter (Signed)
Pt called requesting samples of Xarelto 20 mg. I reviewed that pt chart and notice that this pt has not had a Rx sent to the pharmacy for this medication since 2013. I explained to the pt that the samples were for pts to start Xarelto and that I could send a refill to her pharmacy. Pt stated that she was changing her pharmacy and that she would give our office a call back. I advised the pt that if there is any other problems, questions or concerns to call our office. Pt verbalized understanding.

## 2015-08-03 ENCOUNTER — Ambulatory Visit (HOSPITAL_COMMUNITY)
Admission: RE | Admit: 2015-08-03 | Discharge: 2015-08-03 | Disposition: A | Discharge status: Home / Self Care | Payer: Medicare Other | Source: Ambulatory Visit | Attending: Cardiovascular Disease | Admitting: Cardiovascular Disease

## 2015-08-03 DIAGNOSIS — I10 Essential (primary) hypertension: Secondary | ICD-10-CM | POA: Insufficient documentation

## 2015-08-03 DIAGNOSIS — R0989 Other specified symptoms and signs involving the circulatory and respiratory systems: Secondary | ICD-10-CM | POA: Insufficient documentation

## 2015-08-03 DIAGNOSIS — I6523 Occlusion and stenosis of bilateral carotid arteries: Secondary | ICD-10-CM | POA: Diagnosis not present

## 2015-08-04 ENCOUNTER — Telehealth: Payer: Self-pay | Admitting: Cardiovascular Disease

## 2015-08-04 NOTE — Telephone Encounter (Signed)
PT AWARE OF CAROTID RESULTS ./CY 

## 2015-08-04 NOTE — Telephone Encounter (Signed)
New message ° ° ° ° ° °Returning a call to Christine °

## 2015-08-30 ENCOUNTER — Telehealth: Payer: Self-pay | Admitting: Cardiovascular Disease

## 2015-08-30 DIAGNOSIS — I10 Essential (primary) hypertension: Secondary | ICD-10-CM

## 2015-08-30 MED ORDER — RIVAROXABAN 20 MG PO TABS
20.0000 mg | ORAL_TABLET | Freq: Every day | ORAL | Status: DC
Start: 2015-08-30 — End: 2015-09-01

## 2015-08-30 NOTE — Telephone Encounter (Signed)
Pt called requesting refills on Eliquis. Rx was sent to pt's pharmacy as requested. Confirmation received.

## 2015-09-01 ENCOUNTER — Telehealth: Payer: Self-pay | Admitting: Cardiovascular Disease

## 2015-09-01 DIAGNOSIS — I10 Essential (primary) hypertension: Secondary | ICD-10-CM

## 2015-09-01 MED ORDER — RIVAROXABAN 20 MG PO TABS: 20.0000 mg | ORAL_TABLET | Freq: Every day | ORAL | Status: DC

## 2015-09-01 NOTE — Telephone Encounter (Signed)
PT AWARE OF THE NEED FOR APPT . APPT MADE FOR   11-14-14 AT 10:45  FIRST AVAILABLE   WITH DR Johnsie Cancel    3 REFILLS SENT  TO [facility]    PER PT'S REQUEST    PT UNDERSTANDS   THE NEED TO KEEP  UPCOMING APPT   FOR FUTURE REFILLS./CY

## 2015-09-01 NOTE — Telephone Encounter (Signed)
Pt calling requesting a refill on xarelto 20 mg tablet. Pt has not been seen since 07/2014, OV note stating for pt to f/u as needed. Pt would like for Dr. Kyla Balzarine nurse Altha Harm, RN to give her a call. Please advise

## 2015-09-16 ENCOUNTER — Other Ambulatory Visit: Payer: Self-pay | Admitting: Cardiovascular Disease

## 2015-10-25 ENCOUNTER — Encounter: Payer: Self-pay | Admitting: Family Medicine

## 2015-10-25 ENCOUNTER — Ambulatory Visit (INDEPENDENT_AMBULATORY_CARE_PROVIDER_SITE_OTHER): Payer: Medicare Other | Admitting: Family Medicine

## 2015-10-25 VITALS — BP 106/60 | HR 63 | Temp 97.5°F | Ht 62.0 in | Wt 139.2 lb

## 2015-10-25 DIAGNOSIS — I4891 Unspecified atrial fibrillation: Secondary | ICD-10-CM

## 2015-10-25 DIAGNOSIS — Z7189 Other specified counseling: Secondary | ICD-10-CM

## 2015-10-25 DIAGNOSIS — Z23 Encounter for immunization: Secondary | ICD-10-CM | POA: Diagnosis not present

## 2015-10-25 NOTE — Progress Notes (Signed)
Pre visit review using our clinic review tool, if applicable. No additional management support is needed unless otherwise documented below in the visit note.  Finger OA.  Bengay helps some.  Tylenol didn't help.  Mainly with sx with yard work, with working with her hands.    H/o AF.  She doesn't want to be on meds of any variety, but she is willing to continue as is.  No ADE on med.  No bleeding.  Some bruising.  No CP, SOB.  No BLE edema.    She has some occ L forearm pain with pushing/using her L hand, pushing off with her L hand to get out of a chair.  No trauma.  Noted in the last 2 weeks, after doing some heavier lifting.    Advance directive d/w pt.  Would want her husband and daughter Vaughan Basta equally designated if patient were incapacitated.    Meds, vitals, and allergies reviewed.   ROS: See HPI.  Otherwise, noncontributory.  GEN: nad, alert and oriented HEENT: mucous membranes moist NECK: supple w/o LA CV: rrr PULM: ctab, no inc wob ABD: soft, +bs EXT: no edema SKIN: no acute rash L forearm with normal inspection, not ttp over bony prominences, normal wrist ROM.

## 2015-10-25 NOTE — Patient Instructions (Signed)
Take care.  Glad to see you.  Don't change your meds for now.   I would get a flu shot each fall.   

## 2015-10-27 DIAGNOSIS — Z7189 Other specified counseling: Secondary | ICD-10-CM | POA: Insufficient documentation

## 2015-10-27 NOTE — Assessment & Plan Note (Signed)
Sounds to be in NSR.  Flu shot today.  Continue current meds.  >25 minutes spent in face to face time with patient, >50% spent in counselling or coordination of care.   The forearm pain appears to be a mild strain and should resolve.  Can f/u prn.  No need to image.  Benign exam today.

## 2015-11-09 NOTE — Progress Notes (Signed)
  Patient ID: Ashlee Warren, female   DOB: 07-Jan-1928, 82 y.o.   MRN: [id number]   82 y.o.  patient of [facility] Chronic afib. ON xarelto. Echo 2013 with no atrial enlargement and normal EF without significant valve disease Long discussion with Ashlee Warren about rate control and anticoagulation or one attempt at [facility]. The fact that her echo is good suggests we would be successful in conversion attempt She does not want to take any meds long term especially anticoagulant.  [facility] 03/11/12  successful   Seen by [facility] 8/14 and flecainide stopped due to electrical abnormalities on ETT   CHADVASC 4 and tolerating xarelto  07/07/15 labs reviewed and normal including Hct, PLT and Cr   BP running ok at home  Still worries about everything a lot  Known moderate bilateral carotid disease Reviewed duplex from 4/15  Stable 40-59% bilateral   Duplex 07/2015 new criteria plaque no stenosis no change   She does not like to see her primary care Dr Ashlee Warren   Last visit very agitated .  Complains about fatigue , exhaustion and vestibular symptoms at night when  She turns over in bed but does not see the need to f/u with primary.  Concerned that she is on too high a dose of cardizem but BP is fine.  Her significant other Ashlee Warren with her today and agrees with me about Her mood and behavior.     ROS: Denies fever, malais, weight loss, blurry vision, decreased visual acuity, cough, sputum, SOB, hemoptysis, pleuritic pain, palpitaitons, heartburn, abdominal pain, melena, lower extremity edema, claudication, or rash.  All other systems reviewed and negative  General: Affect appropriate Healthy:  appears stated age 64: normal Neck supple with no adenopathy JVP normal no bruits no thyromegaly Lungs clear with no wheezing and good diaphragmatic motion Heart:  S1/S2 no murmur, no rub, gallop or click PMI normal Abdomen: benighn, BS positve, no tenderness, no AAA no bruit.  No HSM or HJR Distal pulses intact with no bruits No  edema Neuro non-focal Skin warm and dry No muscular weakness   Current Outpatient Prescriptions  Medication Sig Dispense Refill  . diltiazem (CARDIZEM CD) 240 MG 24 hr capsule TAKE 1 CAPSULE BY MOUTH DAILY 90 capsule 0  . rivaroxaban (XARELTO) 20 MG TABS tablet Take 1 tablet (20 mg total) by mouth daily. 30 tablet 3   No current facility-administered medications for this visit.    Allergies  Penicillins and Sulfonamide derivatives   Electrocardiogram: 10/14  SR rate 61 LAD normal  07/2014   NSR rate 74  RBBB LAFB PAC  11/15/15  SR ate 69  LAFB/RBBB no change LVH  Assessment and Plan  PAF:  CHADVASC 4 on xarelto labs today no bleeding issues HTN:  On cardizem stable Carotid:  Some plaque no stenosis f/u 07/2017   RBBB/LAFB:  Yearly ECG no high grade heart block    Jenkins Rouge

## 2015-11-15 ENCOUNTER — Ambulatory Visit (INDEPENDENT_AMBULATORY_CARE_PROVIDER_SITE_OTHER): Payer: Medicare Other | Admitting: Cardiovascular Disease

## 2015-11-15 ENCOUNTER — Encounter: Payer: Self-pay | Admitting: Cardiovascular Disease

## 2015-11-15 VITALS — BP 160/80 | HR 69 | Ht 62.0 in | Wt 137.8 lb

## 2015-11-15 DIAGNOSIS — I4891 Unspecified atrial fibrillation: Secondary | ICD-10-CM | POA: Diagnosis not present

## 2015-11-15 DIAGNOSIS — I10 Essential (primary) hypertension: Secondary | ICD-10-CM | POA: Diagnosis not present

## 2015-11-15 LAB — CBC WITH DIFFERENTIAL/PLATELET
BASOS ABS: 0 10*3/uL (ref 0.0–0.1)
BASOS PCT: 0 % (ref 0–1)
EOS ABS: 0.1 10*3/uL (ref 0.0–0.7)
EOS PCT: 1 % (ref 0–5)
HCT: 42 % (ref 36.0–46.0)
Hemoglobin: 14.3 g/dL (ref 12.0–15.0)
LYMPHS ABS: 2.3 10*3/uL (ref 0.7–4.0)
Lymphocytes Relative: 31 % (ref 12–46)
MCH: 29.8 pg (ref 26.0–34.0)
MCHC: 34 g/dL (ref 30.0–36.0)
MCV: 87.5 fL (ref 78.0–100.0)
MPV: 9.7 fL (ref 8.6–12.4)
Monocytes Absolute: 0.7 10*3/uL (ref 0.1–1.0)
Monocytes Relative: 9 % (ref 3–12)
Neutro Abs: 4.4 10*3/uL (ref 1.7–7.7)
Neutrophils Relative %: 59 % (ref 43–77)
PLATELETS: 274 10*3/uL (ref 150–400)
RBC: 4.8 MIL/uL (ref 3.87–5.11)
RDW: 13.2 % (ref 11.5–15.5)
WBC: 7.4 10*3/uL (ref 4.0–10.5)

## 2015-11-15 LAB — BASIC METABOLIC PANEL
BUN: 19 mg/dL (ref 7–25)
CHLORIDE: 101 mmol/L (ref 98–110)
CO2: 28 mmol/L (ref 20–31)
CREATININE: 0.98 mg/dL — AB (ref 0.60–0.88)
Calcium: 9.6 mg/dL (ref 8.6–10.4)
Glucose, Bld: 90 mg/dL (ref 65–99)
Potassium: 4.2 mmol/L (ref 3.5–5.3)
SODIUM: 141 mmol/L (ref 135–146)

## 2015-11-15 MED ORDER — DILTIAZEM HCL ER COATED BEADS 240 MG PO CP24: 240.0000 mg | ORAL_CAPSULE | Freq: Every day | ORAL | Status: DC

## 2015-11-15 MED ORDER — RIVAROXABAN 20 MG PO TABS: 20.0000 mg | ORAL_TABLET | Freq: Every day | ORAL | Status: DC

## 2015-11-15 NOTE — Patient Instructions (Signed)
Medication Instructions:  Your physician recommends that you continue on your current medications as directed. Please refer to the Current Medication list given to you today.  Labwork: Your physician recommends that you have lab work today. BMET and CBC   Testing/Procedures: NONE  Follow-Up: Your physician wants you to follow-up in: 12 months with Dr. Nishan. You will receive a reminder letter in the mail two months in advance. If you don't receive a letter, please call our office to schedule the follow-up appointment.   If you need a refill on your cardiac medications before your next appointment, please call your pharmacy.    

## 2016-01-05 ENCOUNTER — Telehealth: Payer: Self-pay | Admitting: *Deleted

## 2016-01-05 NOTE — Telephone Encounter (Signed)
Patient called for more samples of Xarelto stating it is too expensive for her to buy and needs some samples so she doesn't have to buy them. Samples placed at the front desk for her to pick up in the morning.

## 2016-01-24 DIAGNOSIS — H353131 Nonexudative age-related macular degeneration, bilateral, early dry stage: Secondary | ICD-10-CM | POA: Diagnosis not present

## 2016-01-24 DIAGNOSIS — H04123 Dry eye syndrome of bilateral lacrimal glands: Secondary | ICD-10-CM | POA: Diagnosis not present

## 2016-01-24 DIAGNOSIS — D3131 Benign neoplasm of right choroid: Secondary | ICD-10-CM | POA: Diagnosis not present

## 2016-01-24 DIAGNOSIS — Z961 Presence of intraocular lens: Secondary | ICD-10-CM | POA: Diagnosis not present

## 2016-02-03 ENCOUNTER — Other Ambulatory Visit: Payer: Medicare Other

## 2016-02-08 ENCOUNTER — Telehealth: Payer: Self-pay | Admitting: *Deleted

## 2016-02-08 NOTE — Telephone Encounter (Signed)
Pt called wanting xarelto, she just got samples last month but told me she went and bought her xarelto, i explained that we get a limited supply and it is used for pts starting on xarelto. She expressed understanding.

## 2016-02-10 ENCOUNTER — Encounter: Payer: Medicare Other | Admitting: Family Medicine

## 2016-02-29 ENCOUNTER — Telehealth: Payer: Self-pay | Admitting: Family Medicine

## 2016-02-29 DIAGNOSIS — M79645 Pain in left finger(s): Secondary | ICD-10-CM

## 2016-03-01 NOTE — Telephone Encounter (Signed)
Patient with L thumb pain, intermittent, ongoing.  Worse recently.  Pain with ROM. No trauma.  D/w pt at husband's OV.  Normal ROM, no redness or swelling, no tendon failure and snuffbox not ttp.  NV intact but finklestein pos.   D/wpt.  Likely tendonitis, rx given to patient for L thumb spica brace to get at DME.  She'll update me as needed.

## 2016-04-06 ENCOUNTER — Telehealth: Payer: Self-pay | Admitting: *Deleted

## 2016-04-06 NOTE — Telephone Encounter (Signed)
Xarelto samples placed at the front desk for patient per her request.

## 2016-05-30 ENCOUNTER — Ambulatory Visit (INDEPENDENT_AMBULATORY_CARE_PROVIDER_SITE_OTHER): Payer: Medicare Other | Admitting: Family Medicine

## 2016-05-30 ENCOUNTER — Encounter: Payer: Self-pay | Admitting: Family Medicine

## 2016-05-30 ENCOUNTER — Ambulatory Visit (INDEPENDENT_AMBULATORY_CARE_PROVIDER_SITE_OTHER)
Admission: RE | Admit: 2016-05-30 | Discharge: 2016-05-30 | Disposition: A | Discharge status: Home / Self Care | Payer: Medicare Other | Source: Ambulatory Visit | Attending: Family Medicine | Admitting: Family Medicine

## 2016-05-30 VITALS — BP 130/84 | Temp 97.8°F | Ht 62.0 in | Wt 139.5 lb

## 2016-05-30 DIAGNOSIS — M25532 Pain in left wrist: Secondary | ICD-10-CM

## 2016-05-30 DIAGNOSIS — M180 Bilateral primary osteoarthritis of first carpometacarpal joints: Secondary | ICD-10-CM | POA: Diagnosis not present

## 2016-05-30 DIAGNOSIS — M1812 Unilateral primary osteoarthritis of first carpometacarpal joint, left hand: Secondary | ICD-10-CM | POA: Diagnosis not present

## 2016-05-30 MED ORDER — METHYLPREDNISOLONE ACETATE 40 MG/ML IJ SUSP: 40.0000 mg | Freq: Once | INTRAMUSCULAR | Status: DC

## 2016-05-30 MED ORDER — METHYLPREDNISOLONE ACETATE 40 MG/ML IJ SUSP
40.0000 mg | Freq: Once | INTRAMUSCULAR | Status: AC
  Administered 2016-05-30: 20 mg via INTRA_ARTICULAR

## 2016-05-30 NOTE — Progress Notes (Signed)
Pre visit review using our clinic review tool, if applicable. No additional management support is needed unless otherwise documented below in the visit note. 

## 2016-05-30 NOTE — Progress Notes (Signed)
Dr. Frederico Hamman T. Yolinda Duerr, MD, [facility] [place], [address] Phone: [phone] Fax: [phone]  05/30/2016  Patient: Ashlee Warren, MRN: [id number], DOB: May 03, 1928, 82 y.o.  Primary Physician:  Elsie Stain, MD   Chief Complaint  Patient presents with  . Arm Pain    left and wrist    Subjective:   Ashlee Warren is a 82 y.o. very pleasant female patient who presents with the following:  Wrist pain gradually worsening over the last couple of months.  6 - 8 months ongoing.  H/o fx in the 6th grade.   Gradually worsening history, and some mild pain at baseline, but she will have specific times and she has using and rotating the hand and wrist where she will have pain directly in the wrist itself. She has never had any swelling or bruising. No prior operations. No traumatic injury. Distant injury, possible fracture in the sixth grade, unclear if she used a cast at that point.  Carpal ligament injury  Past Medical History, Surgical History, Social History, Family History, Problem List, Medications, and Allergies have been reviewed and updated if relevant.  Patient Active Problem List   Diagnosis Date Noted  . Pain of left thumb 02/29/2016  . Advance care planning 10/27/2015  . Depression 07/28/2014  . Carotid bruit 06/08/2013  . Atrial fibrillation ([place]) 09/30/2012  . Essential hypertension 09/30/2012    Past Medical History:  Diagnosis Date  . Dysrhythmia    afib  . Essential hypertension   . H/O atrial fibrillation without current medication    Dr Johnsie Cancel    Past Surgical History:  Procedure Laterality Date  . CARDIOVERSION N/A 03/11/2013   Procedure: CARDIOVERSION;  Surgeon: Josue Hector, MD;  Location: [facility];  Service: Cardiovascular;  Laterality: N/A;  . CATARACT EXTRACTION, BILATERAL  2000  . TONSILLECTOMY      Social History   Social History  . Marital status: Married    Spouse  name: N/A  . Number of children: N/A  . Years of education: N/A   Occupational History  . Not on file.   Social History Main Topics  . Smoking status: Never Smoker  . Smokeless tobacco: Never Used  . Alcohol use No  . Drug use: No  . Sexual activity: Not on file   Other Topics Concern  . Not on file   Social History Narrative   Married 11/05/47   Retired Therapist, sports, worked as Government social research officer, then worked with [employer], retired in [place] History  Problem Relation Age of Onset  . Cancer Mother   . Cancer Sister     Allergies  Allergen Reactions  . Penicillins     Rash R thumb  . Sulfonamide Derivatives     rash    Medication list reviewed and updated in full in [place].  GEN: No fevers, chills. Nontoxic. Primarily MSK c/o today. MSK: Detailed in the HPI GI: tolerating PO intake without difficulty Neuro: No numbness, parasthesias, or tingling associated. Otherwise the pertinent positives of the ROS are noted above.   Objective:   BP 130/84   Temp 97.8 F (36.6 C) (Oral)   Ht 5\' 2"  (1.575 m)   Wt 139 lb 8 oz (63.3 kg)   BMI 25.51 kg/m    GEN: WDWN, NAD, Non-toxic, Alert & Oriented x 3 HEENT: Atraumatic, Normocephalic.  Ears and Nose: No external deformity. EXTR: No clubbing/cyanosis/edema NEURO: Normal  gait.  PSYCH: Normally interactive. Conversant. Not depressed or anxious appearing.  Calm demeanor.    Left wrist with mild tenderness to palpation on the dorsum. There is some swelling at the distal forearm as well. This is on the dorsal aspect.  Extension is diminished by approximately 20. Flexion diminished by approximately 20, and both cause pain. Ulnar and radial deviation causes no significant pain.  Finkelstein's is negative. Patient does have pain at the [facility] joint on the left. She also have pain with resisted supination.  No significant pain along the radius and ulna. Scaphoid and the hook of the hamate are nontender.  Radiology: Dg  Wrist Complete Left  Result Date: 05/30/2016 CLINICAL DATA:  Sixty-eight month history of left wrist pain. No mention of trauma. EXAM: LEFT WRIST - COMPLETE 3+ VIEW COMPARISON:  None in PACs FINDINGS: The bones are subjectively osteopenic. The distal radius and ulna are intact. The radiocarpal, ulnocarpal, and intercarpal joint spaces are preserved. There is moderate narrowing of the first CMC joint. The second through fifth CMC joints are normal. The visualized portions of the metacarpals are intact. The soft tissues are unremarkable. IMPRESSION: Moderate osteoarthritic joint space loss of the first CMC joint. Otherwise no significant acute or chronic bony abnormality of the left wrist. Electronically Signed   By: David  Martinique M.D.   On: 05/30/2016 11:32     Assessment and Plan:   Left wrist pain - Plan: DG Wrist Complete Left, methylPREDNISolone acetate (DEPO-MEDROL) injection 40 mg, DISCONTINUED: methylPREDNISolone acetate (DEPO-MEDROL) injection 40 mg  Primary osteoarthritis of both first carpometacarpal joints  Dorsal wrist pain with swelling, synovitis, and probable carpal ligament injury. Carpal ligament injury more likely at some point in the last year without specific trauma and 82 year old. Probable with some increased instability in the wrist.  CMC OA that is not particularly symptomatic.  We will treat this symptomatically. I reviewed some hand and osteoarthritis hand rehabilitation with her. If she did indeed rupture a carpal ligament, treatment options at this point this far out at and at age 59 are limited. Recommended conservative plan of care.  Injection, Intraarticular Wrist, L Patient verbally consented for procedure; risks (including infection), benefits, and alternatives explained. Patient prepped with Chloraprep. Ethyl chloride used for anesthesia. Using sterile technique, just distal to Lister's tubercle, using 3 cc syringe with 22 gauge needle, needle inserted and  aspirated, no blood. Then 1/2 cc Lidocaine 1% and Depo-medrol 20 mg injected without difficulty into wrist.  Pressue applied, minimal blood. Tolerated well, decreased pain, no complications.   Follow-up: No Follow-up on file.  Orders Placed This Encounter  Procedures  . DG Wrist Complete Left    Signed,  Doniesha Landau T. Breland Trouten, MD   Patient's Medications  New Prescriptions   No medications on file  Previous Medications   DILTIAZEM (CARDIZEM CD) 240 MG 24 HR CAPSULE    Take 1 capsule (240 mg total) by mouth daily.   MULTIPLE VITAMINS-MINERALS (VISION FORMULA EYE HEALTH) CAPS    Take by mouth 2 (two) times daily.   RIVAROXABAN (XARELTO) 20 MG TABS TABLET    Take 1 tablet (20 mg total) by mouth daily.  Modified Medications   No medications on file  Discontinued Medications   No medications on file

## 2016-06-12 ENCOUNTER — Other Ambulatory Visit: Payer: Self-pay

## 2016-06-19 ENCOUNTER — Telehealth: Payer: Self-pay | Admitting: *Deleted

## 2016-06-19 NOTE — Telephone Encounter (Signed)
called wanting samples, see doc note  Explained to pt that she has been getting samples for 4 years, she has bought her Xarelto before, and that she would have to apply for PA as we can not continue to give her samples, she expressed understanding, she will get 2 weeks now & I explained that she has to fill out the PA form bring it back and we would give her enough (2 WKS) to get an answer, if its a denial we will talk to Dr Johnsie Cancel about a medication she can afford, otherwise this will be the last samples she can get as we can not keep giving out samples for established pt's, she expressed understanding.

## 2016-07-19 ENCOUNTER — Ambulatory Visit (INDEPENDENT_AMBULATORY_CARE_PROVIDER_SITE_OTHER): Payer: Medicare Other

## 2016-07-19 DIAGNOSIS — Z23 Encounter for immunization: Secondary | ICD-10-CM | POA: Diagnosis not present

## 2016-09-03 ENCOUNTER — Telehealth: Payer: Self-pay | Admitting: Family Medicine

## 2016-09-03 NOTE — Telephone Encounter (Signed)
Called to schedule 2018 AWV/Medicare OV. Sounded like someone picked the phone up but did not say anything.

## 2016-09-12 NOTE — Telephone Encounter (Signed)
Pt called back requesting samples of xarelto and I informed pt per telephone notes, per Theadora Rama, CMA, that pt needed to bring back the forms that was given to her so the person that handles prior auth could see about her getting her medication a little cheaper, if not free. Pt stated that she still has the forms and that she needed samples. I gave the pt's information to [place], CMA, since she was the one that talked with the pt about this before.

## 2016-09-12 NOTE — Telephone Encounter (Signed)
Left message for patient to call back  

## 2016-10-03 ENCOUNTER — Ambulatory Visit (INDEPENDENT_AMBULATORY_CARE_PROVIDER_SITE_OTHER): Payer: Medicare Other | Admitting: Family Medicine

## 2016-10-03 ENCOUNTER — Encounter: Payer: Self-pay | Admitting: Family Medicine

## 2016-10-03 DIAGNOSIS — J069 Acute upper respiratory infection, unspecified: Secondary | ICD-10-CM | POA: Diagnosis not present

## 2016-10-03 MED ORDER — DOXYCYCLINE HYCLATE 100 MG PO TABS: 100.0000 mg | ORAL_TABLET | Freq: Two times a day (BID) | ORAL | 0 refills | Status: DC

## 2016-10-03 NOTE — Progress Notes (Signed)
Pre visit review using our clinic review tool, if applicable. No additional management support is needed unless otherwise documented below in the visit note. 

## 2016-10-03 NOTE — Patient Instructions (Signed)
Use the neti pot for now.  Rest and fluids.   If worse in the next few days, then start doxycycline.  Take care.  Glad to see you.

## 2016-10-03 NOTE — Assessment & Plan Note (Signed)
Nontoxic. She could be developing sinusitis. Early in the course at this point. Supportive care for now. Hold prescription for doxycycline. Start if worse in the next few days. She agrees. Update me as needed.

## 2016-10-03 NOTE — Progress Notes (Signed)
Sx started today.  Woke up today with sx.  Rhinorrhea.  Tooth pain.  Facial pain and pressure.  Used neti pot today with some relief.  Snoring more than usual for the last few nights, noted by husband.    Meds, vitals, and allergies reviewed.   ROS: Per HPI unless specifically indicated in ROS section   GEN: nad, alert and oriented HEENT: mucous membranes moist, tm w/o erythema, nasal exam w/o erythema, clear discharge noted,  OP with cobblestoning, sinuses ttp x4 NECK: supple w/o LA CV: rrr.   PULM: ctab, no inc wob EXT: no edema SKIN: no acute rash

## 2016-10-21 NOTE — Progress Notes (Signed)
  Patient ID: Ashlee Warren, female   DOB: Feb 14, 1928, 83 y.o.   MRN: [id number]   83 y.o.  patient of [facility] Chronic afib. ON xarelto. Echo 2013 with no atrial enlargement and normal EF without significant valve disease Long discussion with [name] about rate control and anticoagulation or one attempt at [facility]. The fact that her echo is good suggests we would be successful in conversion attempt She does not want to take any meds long term especially anticoagulant.  [facility] 03/11/12  successful   Seen by [facility] 8/14 and flecainide stopped due to electrical abnormalities on ETT   CHADVASC 4 and tolerating xarelto  07/07/15 labs reviewed and normal including Hct, PLT and Cr   BP running ok at home  Still worries about everything a lot  Known moderate bilateral carotid disease Reviewed duplex from 4/15  Stable 40-59% bilateral   Duplex 07/2015 new criteria plaque no stenosis no change   She has anxiety issues with multiple somatic complaints Sometimes I have to illicit her significant other [name] to calm Her down  Seen by [name] 12/20 for ? Sinusitis told to use neti pot  ROS: Denies fever, malais, weight loss, blurry vision, decreased visual acuity, cough, sputum, SOB, hemoptysis, pleuritic pain, palpitaitons, heartburn, abdominal pain, melena, lower extremity edema, claudication, or rash.  All other systems reviewed and negative  General: Affect appropriate Healthy:  appears stated age 83: normal Neck supple with no adenopathy JVP normal no bruits no thyromegaly Lungs clear with no wheezing and good diaphragmatic motion Heart:  S1/S2 no murmur, no rub, gallop or click PMI normal Abdomen: benighn, BS positve, no tenderness, no AAA no bruit.  No HSM or HJR Distal pulses intact with no bruits No edema Neuro non-focal Skin warm and dry No muscular weakness   Current Outpatient Prescriptions  Medication Sig Dispense Refill  . diltiazem (CARDIZEM CD) 240 MG 24 hr capsule Take 1 capsule  (240 mg total) by mouth daily. 90 capsule 3  . Multiple Vitamins-Minerals (VISION FORMULA EYE HEALTH) CAPS Take by mouth 2 (two) times daily.    . rivaroxaban (XARELTO) 20 MG TABS tablet Take 1 tablet (20 mg total) by mouth daily. 30 tablet 11   No current facility-administered medications for this visit.     Allergies  Penicillins and Sulfonamide derivatives   Electrocardiogram: 10/14  SR rate 61 LAD normal  07/2014   NSR rate 74  RBBB LAFB PAC  11/15/15  SR ate 69  LAFB/RBBB no change LVH 10/24/16 SR RBBB LAFB PVC rate 73   Assessment and Plan  PAF:  CHADVASC 4 on xarelto  no bleeding issues HTN:  On cardizem stable Carotid:  Some plaque no stenosis f/u 07/2017   RBBB/LAFB:  Yearly ECG no high grade heart block  Sinusitis: resolved after doxycycline Rx  Jenkins Rouge

## 2016-10-24 ENCOUNTER — Encounter: Payer: Self-pay | Admitting: Cardiovascular Disease

## 2016-10-24 ENCOUNTER — Ambulatory Visit (INDEPENDENT_AMBULATORY_CARE_PROVIDER_SITE_OTHER): Payer: Medicare Other | Admitting: Cardiovascular Disease

## 2016-10-24 VITALS — BP 164/60 | HR 79 | Ht 62.0 in | Wt 137.1 lb

## 2016-10-24 DIAGNOSIS — I10 Essential (primary) hypertension: Secondary | ICD-10-CM | POA: Diagnosis not present

## 2016-10-24 DIAGNOSIS — R0989 Other specified symptoms and signs involving the circulatory and respiratory systems: Secondary | ICD-10-CM | POA: Diagnosis not present

## 2016-10-24 DIAGNOSIS — Z79899 Other long term (current) drug therapy: Secondary | ICD-10-CM

## 2016-10-24 MED ORDER — RIVAROXABAN 20 MG PO TABS: 20.0000 mg | ORAL_TABLET | Freq: Every day | ORAL | 3 refills | Status: DC

## 2016-10-24 NOTE — Patient Instructions (Addendum)
Medication Instructions:  Your physician recommends that you continue on your current medications as directed. Please refer to the Current Medication list given to you today.  Labwork: Your physician recommends that you have lab work today- BMET and CBC  Testing/Procedures: NONE  Follow-Up: Your physician wants you to follow-up in: 6 months with [name]. You will receive a reminder letter in the mail two months in advance. If you don't receive a letter, please call our office to schedule the follow-up appointment.   If you need a refill on your cardiac medications before your next appointment, please call your pharmacy.

## 2016-10-25 LAB — CBC WITH DIFFERENTIAL/PLATELET
BASOS: 0 %
Basophils Absolute: 0 10*3/uL (ref 0.0–0.2)
EOS (ABSOLUTE): 0.1 10*3/uL (ref 0.0–0.4)
Eos: 2 %
Hematocrit: 41 % (ref 34.0–46.6)
Hemoglobin: 14 g/dL (ref 11.1–15.9)
Immature Grans (Abs): 0 10*3/uL (ref 0.0–0.1)
Immature Granulocytes: 0 %
LYMPHS ABS: 2.7 10*3/uL (ref 0.7–3.1)
Lymphs: 32 %
MCH: 30.4 pg (ref 26.6–33.0)
MCHC: 34.1 g/dL (ref 31.5–35.7)
MCV: 89 fL (ref 79–97)
MONOS ABS: 0.8 10*3/uL (ref 0.1–0.9)
Monocytes: 9 %
NEUTROS PCT: 57 %
Neutrophils Absolute: 4.9 10*3/uL (ref 1.4–7.0)
PLATELETS: 319 10*3/uL (ref 150–379)
RBC: 4.61 x10E6/uL (ref 3.77–5.28)
RDW: 13.2 % (ref 12.3–15.4)
WBC: 8.6 10*3/uL (ref 3.4–10.8)

## 2016-10-25 LAB — BASIC METABOLIC PANEL WITH GFR
BUN/Creatinine Ratio: 19 (ref 12–28)
BUN: 17 mg/dL (ref 8–27)
CO2: 24 mmol/L (ref 18–29)
Calcium: 9.4 mg/dL (ref 8.7–10.3)
Chloride: 101 mmol/L (ref 96–106)
Creatinine, Ser: 0.88 mg/dL (ref 0.57–1.00)
GFR calc Af Amer: 69 mL/min/1.73
GFR calc non Af Amer: 60 mL/min/1.73
Glucose: 78 mg/dL (ref 65–99)
Potassium: 4.1 mmol/L (ref 3.5–5.2)
Sodium: 144 mmol/L (ref 134–144)

## 2017-01-14 ENCOUNTER — Other Ambulatory Visit: Payer: Self-pay | Admitting: Cardiovascular Disease

## 2017-01-14 DIAGNOSIS — I4891 Unspecified atrial fibrillation: Secondary | ICD-10-CM

## 2017-01-23 DIAGNOSIS — X32XXXD Exposure to sunlight, subsequent encounter: Secondary | ICD-10-CM | POA: Diagnosis not present

## 2017-01-23 DIAGNOSIS — L570 Actinic keratosis: Secondary | ICD-10-CM | POA: Diagnosis not present

## 2017-01-23 DIAGNOSIS — C44311 Basal cell carcinoma of skin of nose: Secondary | ICD-10-CM | POA: Diagnosis not present

## 2017-01-23 DIAGNOSIS — D225 Melanocytic nevi of trunk: Secondary | ICD-10-CM | POA: Diagnosis not present

## 2017-01-25 DIAGNOSIS — H353131 Nonexudative age-related macular degeneration, bilateral, early dry stage: Secondary | ICD-10-CM | POA: Diagnosis not present

## 2017-01-25 DIAGNOSIS — D3131 Benign neoplasm of right choroid: Secondary | ICD-10-CM | POA: Diagnosis not present

## 2017-01-25 DIAGNOSIS — Z961 Presence of intraocular lens: Secondary | ICD-10-CM | POA: Diagnosis not present

## 2017-01-25 DIAGNOSIS — H04123 Dry eye syndrome of bilateral lacrimal glands: Secondary | ICD-10-CM | POA: Diagnosis not present

## 2017-02-19 ENCOUNTER — Ambulatory Visit: Payer: Medicare Other

## 2017-03-04 ENCOUNTER — Encounter: Payer: Self-pay | Admitting: Family Medicine

## 2017-03-04 ENCOUNTER — Ambulatory Visit (INDEPENDENT_AMBULATORY_CARE_PROVIDER_SITE_OTHER): Payer: Medicare Other

## 2017-03-04 ENCOUNTER — Ambulatory Visit (INDEPENDENT_AMBULATORY_CARE_PROVIDER_SITE_OTHER): Payer: Medicare Other | Admitting: Family Medicine

## 2017-03-04 ENCOUNTER — Encounter: Payer: Self-pay | Admitting: Cardiovascular Disease

## 2017-03-04 VITALS — BP 160/72 | HR 72 | Temp 97.6°F | Ht 61.0 in | Wt 139.8 lb

## 2017-03-04 DIAGNOSIS — I10 Essential (primary) hypertension: Secondary | ICD-10-CM

## 2017-03-04 DIAGNOSIS — Z0000 Encounter for general adult medical examination without abnormal findings: Secondary | ICD-10-CM | POA: Diagnosis not present

## 2017-03-04 NOTE — Progress Notes (Signed)
Subjective:   Ashlee Warren is a 83 y.o. female who presents for an Initial Medicare Annual Wellness Visit.  Review of Systems    N/A Cardiac Risk Factors include: advanced age (>84men, >7 women);hypertension     Objective:    Today's Vitals   03/04/17 1351  BP: (!) 160/72  Pulse: 72  Temp: 97.6 F (36.4 C)  TempSrc: Oral  SpO2: 97%  Weight: 139 lb 12 oz (63.4 kg)  Height: 5\' 1"  (1.549 m)  PainSc: 0-No pain   Body mass index is 26.41 kg/m.   Current Medications (verified) Outpatient Encounter Prescriptions as of 03/04/2017  Medication Sig  . diltiazem (CARDIZEM CD) 240 MG 24 hr capsule TAKE 1 CAPSULE BY MOUTH DAILY  . Multiple Vitamins-Minerals (VISION FORMULA EYE HEALTH) CAPS Take by mouth 2 (two) times daily.  . rivaroxaban (XARELTO) 20 MG TABS tablet Take 1 tablet (20 mg total) by mouth daily.   No facility-administered encounter medications on file as of 03/04/2017.     Allergies (verified) Penicillins and Sulfonamide derivatives   History: Past Medical History:  Diagnosis Date  . Atrial fibrillation ([place])   . Dysrhythmia    afib  . Essential hypertension    Past Surgical History:  Procedure Laterality Date  . CARDIOVERSION N/A 03/11/2013   Procedure: CARDIOVERSION;  Surgeon: Josue Hector, MD;  Location: [place];  Service: Cardiovascular;  Laterality: N/A;  . CATARACT EXTRACTION, BILATERAL  2000  . TONSILLECTOMY     Family History  Problem Relation Age of Onset  . Cancer Mother        leukemia  . Cancer Sister    Social History   Occupational History  . Not on file.   Social History Main Topics  . Smoking status: Never Smoker  . Smokeless tobacco: Never Used  . Alcohol use No  . Drug use: No  . Sexual activity: Not on file    Tobacco Counseling Counseling given: No   Activities of Daily Living In your present state of health, do you have any difficulty performing the following activities: 03/04/2017  Hearing? Y  Vision? Y    Difficulty concentrating or making decisions? N  Walking or climbing stairs? N  Dressing or bathing? N  Doing errands, shopping? N  Preparing Food and eating ? N  Using the Toilet? N  In the past six months, have you accidently leaked urine? N  Do you have problems with loss of bowel control? N  Managing your Medications? N  Managing your Finances? N  Housekeeping or managing your Housekeeping? N  Some recent data might be hidden    Immunizations and Health Maintenance Immunization History  Administered Date(s) Administered  . Influenza,inj,Quad PF,36+ Mos 10/25/2015, 07/19/2016   There are no preventive care reminders to display for this patient.  Patient Care Team: Tonia Ghent, MD as PCP - General (Family Medicine)  Indicate any recent Medical Services you may have received from other than [facility] providers in the past year (date may be approximate).     Assessment:   This is a routine wellness examination for Ashlee Warren.   Hearing/Vision screen  Hearing Screening   125Hz  250Hz  500Hz  1000Hz  2000Hz  3000Hz  4000Hz  6000Hz  8000Hz   Right ear:   40 40 40  40    Left ear:   40 40 40  40    Vision Screening Comments: Last vision exam with Dr. Katy Fitch Feb 2018  Dietary issues and exercise activities discussed: Current Exercise Habits: Structured exercise class,  Time (Minutes): 45, Frequency (Times/Week): 2, Weekly Exercise (Minutes/Week): 90, Intensity: Moderate, Exercise limited by: None identified  Goals    . Increase physical activity          Starting 03/04/17, I will continue to exercise for at least 45 min twice weekly.       Depression Screen PHQ 2/9 Scores 03/04/2017 12/09/2014 08/28/2013  PHQ - 2 Score 0 0 0    Fall Risk Fall Risk  03/04/2017 06/12/2016 12/09/2014 08/28/2013  Falls in the past year? No No No No    Cognitive Function: MMSE - Mini Mental State Exam 03/04/2017  Orientation to time 5  Orientation to Place 5  Registration 3  Attention/ Calculation 0   Recall 1  Recall-comments pt was unable to recall 2 of 3 words  Language- name 2 objects 0  Language- repeat 1  Language- follow 3 step command 3  Language- read & follow direction 0  Write a sentence 0  Copy design 0  Total score 18     PLEASE NOTE: A Mini-Cog screen was completed. Maximum score is 20. A value of 0 denotes this part of [place]stein MMSE was not completed or the patient failed this part of the Mini-Cog screening.   Mini-Cog Screening Orientation to Time - Max 5 pts Orientation to Place - Max 5 pts Registration - Max 3 pts Recall - Max 3 pts Language Repeat - Max 1 pts Language Follow 3 Step Command - Max 3 pts     Screening Tests Health Maintenance  Topic Date Due  . DEXA SCAN  03/04/2026 (Originally 03/19/1995)  . PNA vac Low Risk Adult (1 of 2 - PCV13) 03/04/2026 (Originally 03/19/1995)  . TETANUS/TDAP  03/05/2026 (Originally 03/18/1947)  . INFLUENZA VACCINE  05/15/2017      Plan:     I have personally reviewed and addressed the Medicare Annual Wellness questionnaire and have noted the following in the patient's chart:  A. Medical and social history B. Use of alcohol, tobacco or illicit drugs  C. Current medications and supplements D. Functional ability and status E.  Nutritional status F.  Physical activity G. Advance directives H. List of other physicians I.  Hospitalizations, surgeries, and ER visits in previous 12 months J.  [place] to include hearing, vision, cognitive, depression L. Referrals and appointments - none  In addition, I have reviewed and discussed with patient certain preventive protocols, quality metrics, and best practice recommendations. A written personalized care plan for preventive services as well as general preventive health recommendations were provided to patient.  See attached scanned questionnaire for additional information.   Signed,   Lindell Noe, MHA, BS, LPN Health Coach

## 2017-03-04 NOTE — Patient Instructions (Signed)
Don't change your meds for now.  Update me as needed.  Take care.  Glad to see you.   

## 2017-03-04 NOTE — Progress Notes (Signed)
Pre visit review using our clinic review tool, if applicable. No additional management support is needed unless otherwise documented below in the visit note. 

## 2017-03-04 NOTE — Assessment & Plan Note (Addendum)
She recalled 3/3 when talking with me.  She and I both think her memory is functional.  No red flag events.   DXA d/w pt.  She'll consider.   Mammogram pending.  D/w pt.   Advance directive d/w pt.  Would want her husband, daughter Ashlee Warren, and her grandson Ashlee Warren all designated if patient were incapacitated. Hearing aids declined.  Hearing screen failed.  Mood.  Some days are better than others but she is doing well in general.   Vaccination in general d/w pt, encouraged.

## 2017-03-04 NOTE — Assessment & Plan Note (Signed)
Cr prev okay, d/w pt.   Reasonable BP on home checks, I don't want to induce hypotension, d/w pt.  She agrees.  No ADE on meds.  No bleeding, no bruising on xarelto.   She is not enthused about starting a statin so lipids not checked.   Continue work on diet and exercise.  >25 minutes spent in face to face time with patient, >50% spent in counselling or coordination of care.

## 2017-03-04 NOTE — Progress Notes (Signed)
She recalled 3/3 when talking with me.  She and I both think her memory is functional.  No red flag events.   DXA d/w pt.  She'll consider.   Mammogram pending.  D/w pt.   Advance directive d/w pt.  Would want her husband, daughter Vaughan Basta, and her grandson Irine Seal all designated if patient were incapacitated. Hearing aids declined.  Hearing screen failed.  Mood.  Some days are better than others but she is doing well in general.   Vaccination in general d/w pt, encouraged.    Hypertension/AF.  Rare sensation of skipped beats.   Using medication without problems or lightheadedness:  yes Chest pain with exertion:no Edema:no Short of breath:no Average home BPs: usually some lower at home, 140-150s/60-70s at home.   Still on blood thinner.  No ADE on med.  No bleeding, no bruising.   She is not enthused about starting a statin so lipids not checked.    I'll defer carotid f/u to cardiology, d/w pt.  She agrees.   She has eye clinic f/u re: R eye macular degeneration.    Meds, vitals, and allergies reviewed.   PMH and SH reviewed  ROS: Per HPI unless specifically indicated in ROS section   GEN: nad, alert and oriented HEENT: mucous membranes moist NECK: supple w/o LA CV: sounds to be RRR PULM: ctab, no inc wob ABD: soft, +bs EXT: no edema SKIN: no acute rash

## 2017-03-04 NOTE — Patient Instructions (Addendum)
Ashlee Warren , Thank you for taking time to come for your Medicare Wellness Visit. I appreciate your ongoing commitment to your health goals. Please review the following plan we discussed and let me know if I can assist you in the future.   These are the goals we discussed: Goals    . Increase physical activity          Starting 03/04/17, I will continue to exercise for at least 45 min twice weekly.        This is a list of the screening recommended for you and due dates:  Health Maintenance  Topic Date Due  . DEXA scan (bone density measurement)  03/04/2026*  . Pneumonia vaccines (1 of 2 - PCV13) 03/04/2026*  . Tetanus Vaccine  03/05/2026*  . Flu Shot  05/15/2017  *Topic was postponed. The date shown is not the original due date.   Preventive Care for Adults  A healthy lifestyle and preventive care can promote health and wellness. Preventive health guidelines for adults include the following key practices.  . A routine yearly physical is a good way to check with your health care provider about your health and preventive screening. It is a chance to share any concerns and updates on your health and to receive a thorough exam.  . Visit your dentist for a routine exam and preventive care every 6 months. Brush your teeth twice a day and floss once a day. Good oral hygiene prevents tooth decay and gum disease.  . The frequency of eye exams is based on your age, health, family medical history, use  of contact lenses, and other factors. Follow your health care provider's ecommendations for frequency of eye exams.  . Eat a healthy diet. Foods like vegetables, fruits, whole grains, low-fat dairy products, and lean protein foods contain the nutrients you need without too many calories. Decrease your intake of foods high in solid fats, added sugars, and salt. Eat the right amount of calories for you. Get information about a proper diet from your health care provider, if necessary.  . Regular  physical exercise is one of the most important things you can do for your health. Most adults should get at least 150 minutes of moderate-intensity exercise (any activity that increases your heart rate and causes you to sweat) each week. In addition, most adults need muscle-strengthening exercises on 2 or more days a week.  Silver Sneakers may be a benefit available to you. To determine eligibility, you may visit the website: www.silversneakers.com or contact program at 815-139-5454 Mon-Fri between 8AM-8PM.   . Maintain a healthy weight. The body mass index (BMI) is a screening tool to identify possible weight problems. It provides an estimate of body fat based on height and weight. Your health care provider can find your BMI and can help you achieve or maintain a healthy weight.   For adults 20 years and older: ? A BMI below 18.5 is considered underweight. ? A BMI of 18.5 to 24.9 is normal. ? A BMI of 25 to 29.9 is considered overweight. ? A BMI of 30 and above is considered obese.   . Maintain normal blood lipids and cholesterol levels by exercising and minimizing your intake of saturated fat. Eat a balanced diet with plenty of fruit and vegetables. Blood tests for lipids and cholesterol should begin at age 33 and be repeated every 5 years. If your lipid or cholesterol levels are high, you are over 50, or you are at high risk  for heart disease, you may need your cholesterol levels checked more frequently. Ongoing high lipid and cholesterol levels should be treated with medicines if diet and exercise are not working.  . If you smoke, find out from your health care provider how to quit. If you do not use tobacco, please do not start.  . If you choose to drink alcohol, please do not consume more than 2 drinks per day. One drink is considered to be 12 ounces (355 mL) of beer, 5 ounces (148 mL) of wine, or 1.5 ounces (44 mL) of liquor.  . If you are 80-7 years old, ask your health care provider if  you should take aspirin to prevent strokes.  . Use sunscreen. Apply sunscreen liberally and repeatedly throughout the day. You should seek shade when your shadow is shorter than you. Protect yourself by wearing long sleeves, pants, a wide-brimmed hat, and sunglasses year round, whenever you are outdoors.  . Once a month, do a whole body skin exam, using a mirror to look at the skin on your back. Tell your health care provider of new moles, moles that have irregular borders, moles that are larger than a pencil eraser, or moles that have changed in shape or color.

## 2017-03-04 NOTE — Progress Notes (Signed)
PCP notes:   Health maintenance:  Bone density - pt declined PNA vaccines - pt declined Tetanus - pt declined  Abnormal screenings:   Mini-Cog score: 18/20  Patient concerns:   None  Nurse concerns:  Pt's BP was elevated. PCP notified.  Next PCP appt:   03/04/17 @ 1400  I reviewed health advisor's note, was available for consultation on the day of service listed in this note, and agree with documentation and plan. Elsie Stain, MD.

## 2017-03-06 DIAGNOSIS — Z08 Encounter for follow-up examination after completed treatment for malignant neoplasm: Secondary | ICD-10-CM | POA: Diagnosis not present

## 2017-03-06 DIAGNOSIS — Z85828 Personal history of other malignant neoplasm of skin: Secondary | ICD-10-CM | POA: Diagnosis not present

## 2017-03-18 DIAGNOSIS — Z1231 Encounter for screening mammogram for malignant neoplasm of breast: Secondary | ICD-10-CM | POA: Diagnosis not present

## 2017-03-19 ENCOUNTER — Encounter: Payer: Self-pay | Admitting: Family Medicine

## 2017-03-20 ENCOUNTER — Encounter: Payer: Self-pay | Admitting: *Deleted

## 2017-04-24 ENCOUNTER — Ambulatory Visit: Payer: Medicare Other | Admitting: Cardiovascular Disease

## 2017-04-30 DIAGNOSIS — Z6825 Body mass index (BMI) 25.0-25.9, adult: Secondary | ICD-10-CM | POA: Diagnosis not present

## 2017-04-30 DIAGNOSIS — Z124 Encounter for screening for malignant neoplasm of cervix: Secondary | ICD-10-CM | POA: Diagnosis not present

## 2017-04-30 DIAGNOSIS — Z01419 Encounter for gynecological examination (general) (routine) without abnormal findings: Secondary | ICD-10-CM | POA: Diagnosis not present

## 2017-05-06 ENCOUNTER — Encounter: Payer: Self-pay | Admitting: Family Medicine

## 2017-05-06 ENCOUNTER — Ambulatory Visit (INDEPENDENT_AMBULATORY_CARE_PROVIDER_SITE_OTHER): Payer: Medicare Other | Admitting: Family Medicine

## 2017-05-06 ENCOUNTER — Telehealth: Payer: Self-pay | Admitting: Family Medicine

## 2017-05-06 DIAGNOSIS — B029 Zoster without complications: Secondary | ICD-10-CM

## 2017-05-06 MED ORDER — GABAPENTIN 100 MG PO CAPS: 100.0000 mg | ORAL_CAPSULE | Freq: Three times a day (TID) | ORAL | 0 refills | Status: DC

## 2017-05-06 MED ORDER — VALACYCLOVIR HCL 1 G PO TABS: 1000.0000 mg | ORAL_TABLET | Freq: Two times a day (BID) | ORAL | 0 refills | Status: DC

## 2017-05-06 NOTE — Progress Notes (Signed)
R lower back with rash, cluster of lesions.  Noted about 3 days ago.  Tender and painful to the touch, sensitive.  No lesions on the L side of the back.    No FCNAVD.    Meds, vitals, and allergies reviewed.   ROS: Per HPI unless specifically indicated in ROS section   nad ncat rrr ctab R lower back with cluster of blistered lesions with surrounding erythema in a dermatomal distribution with concurrent paresthesia in the same dermatome

## 2017-05-06 NOTE — Telephone Encounter (Signed)
Please get update on patient.  See if she was able to be see, add on here if needed.  Thanks.

## 2017-05-06 NOTE — Patient Instructions (Signed)
Shingles.  Start valtrex today and use gabapentin as needed for pain.  Update me as needed.  Take care.  Glad to see you.

## 2017-05-06 NOTE — Telephone Encounter (Signed)
Appointment scheduled.

## 2017-05-06 NOTE — Telephone Encounter (Signed)
[facility] Patient Name: INANNA TELFORD DOB: 07/25/1928 Initial Comment Caller's wife has a rash on her right side and is the size of half a dollar. She thinks it might be shingles.  She says it burns and has blisters. Nurse Assessment Nurse: Markus Daft, RN, Sherre Poot Date/Time (Eastern Time): 05/06/2017 9:25:36 AM Confirm and document reason for call. If symptomatic, describe symptoms. ---Caller's wife has a rash on her right side of her back (just above the hip), and is the size of half a dollar with little blisters. c/o burning discomfort. She thinks it might be shingles. S/S started Friday. Does the patient have any new or worsening symptoms? ---Yes Will a triage be completed? ---Yes Related visit to physician within the last 2 weeks? ---No Does the PT have any chronic conditions? (i.e. diabetes, asthma, etc.) ---Yes List chronic conditions. ---Shingles Is this a behavioral health or substance abuse call? ---No Guidelines Guideline Title Affirmed Question Affirmed Notes Shingles [1] Shingles rash (matches SYMPTOMS) AND [2] onset within past 72 hours Final Disposition User See Physician within [place], Therapist, sports, [place] Comments No appts available at [facility], [facility], or [facility]. Caller states that she would rather go to an UC that is a mile away. Referrals REFERRED TO PCP OFFICE Disagree/Comply: Comply

## 2017-05-07 DIAGNOSIS — B029 Zoster without complications: Secondary | ICD-10-CM | POA: Insufficient documentation

## 2017-05-07 NOTE — Assessment & Plan Note (Signed)
Discussed with patient, start Valtrex, can use gabapentin as needed for pain. Routine cautions given about medication and disease process. Update me as needed. She agrees.

## 2017-05-21 NOTE — Progress Notes (Signed)
  Patient ID: Ashlee Warren, female   DOB: 01/31/1928, 83 y.o.   MRN: [id number]   83 y.o.  patient of [facility] Chronic afib. ON xarelto. Echo 2013 with no atrial enlargement and normal EF without significant valve disease Long discussion with [name] about rate control and anticoagulation or one attempt at [facility]. The fact that her echo is good suggests we would be successful in conversion attempt She does not want to take any meds long term especially anticoagulant.  [facility] 03/11/12  successful   Seen by [facility] 8/14 and flecainide stopped due to electrical abnormalities on ETT   CHADVASC 4 and tolerating xarelto  07/07/15 labs reviewed and normal including Hct, PLT and Cr   BP running ok at home  Still worries about everything a lot   Duplex 07/2015 new criteria plaque no stenosis no change   She has anxiety issues with multiple somatic complaints Sometimes I have to illicit her significant other Wade to calm Her down  Having some vivid dreams   ROS: Denies fever, malais, weight loss, blurry vision, decreased visual acuity, cough, sputum, SOB, hemoptysis, pleuritic pain, palpitaitons, heartburn, abdominal pain, melena, lower extremity edema, claudication, or rash.  All other systems reviewed and negative  General: BP 136/80   Pulse 76   Ht 5\' 2"  (1.575 m)   Wt 138 lb (62.6 kg)   SpO2 96%   BMI 25.24 kg/m  Affect appropriate Healthy:  appears stated age 83: normal Neck supple with no adenopathy JVP normal no bruits no thyromegaly Lungs clear with no wheezing and good diaphragmatic motion Heart:  S1/S2 no murmur, no rub, gallop or click PMI normal Abdomen: benighn, BS positve, no tenderness, no AAA no bruit.  No HSM or HJR Distal pulses intact with no bruits No edema Neuro non-focal Skin warm and dry No muscular weakness    Current Outpatient Prescriptions  Medication Sig Dispense Refill  . diltiazem (CARDIZEM CD) 240 MG 24 hr capsule TAKE 1 CAPSULE BY MOUTH DAILY 90 capsule 3   . rivaroxaban (XARELTO) 20 MG TABS tablet Take 1 tablet (20 mg total) by mouth daily. 90 tablet 3   No current facility-administered medications for this visit.     Allergies  Penicillins and Sulfonamide derivatives   Electrocardiogram: 10/14  SR rate 61 LAD normal  07/2014   NSR rate 74  RBBB LAFB PAC  11/15/15  SR ate 69  LAFB/RBBB no change LVH 10/24/16 SR RBBB LAFB PVC rate 73  05/22/17 SR rate 77 RBBB LAFB PR 198 sinus arrhythmia  Assessment and Plan  PAF:  CHADVASC 4 on xarelto  no bleeding issues CBC/PLT and BMET today  HTN:  On cardizem stable Carotid:  Some plaque no stenosis f/u 07/2017   RBBB/LAFB:  Yearly ECG no high grade heart block    Jenkins Rouge

## 2017-05-22 ENCOUNTER — Ambulatory Visit (INDEPENDENT_AMBULATORY_CARE_PROVIDER_SITE_OTHER): Payer: Medicare Other | Admitting: Cardiovascular Disease

## 2017-05-22 ENCOUNTER — Encounter: Payer: Self-pay | Admitting: Cardiovascular Disease

## 2017-05-22 VITALS — BP 136/80 | HR 76 | Ht 62.0 in | Wt 138.0 lb

## 2017-05-22 DIAGNOSIS — R0989 Other specified symptoms and signs involving the circulatory and respiratory systems: Secondary | ICD-10-CM

## 2017-05-22 DIAGNOSIS — I4891 Unspecified atrial fibrillation: Secondary | ICD-10-CM | POA: Diagnosis not present

## 2017-05-22 LAB — CBC
Hematocrit: 40.8 % (ref 34.0–46.6)
Hemoglobin: 14.5 g/dL (ref 11.1–15.9)
MCH: 30.2 pg (ref 26.6–33.0)
MCHC: 35.5 g/dL (ref 31.5–35.7)
MCV: 85 fL (ref 79–97)
Platelets: 299 10*3/uL (ref 150–379)
RBC: 4.8 x10E6/uL (ref 3.77–5.28)
RDW: 13.9 % (ref 12.3–15.4)
WBC: 8.7 10*3/uL (ref 3.4–10.8)

## 2017-05-22 LAB — BASIC METABOLIC PANEL
BUN/Creatinine Ratio: 19 (ref 12–28)
BUN: 16 mg/dL (ref 8–27)
CO2: 24 mmol/L (ref 20–29)
Calcium: 9.4 mg/dL (ref 8.7–10.3)
Chloride: 102 mmol/L (ref 96–106)
Creatinine, Ser: 0.83 mg/dL (ref 0.57–1.00)
GFR calc Af Amer: 73 mL/min/{1.73_m2} (ref >59)
GFR calc non Af Amer: 64 mL/min/{1.73_m2} (ref >59)
Glucose: 89 mg/dL (ref 65–99)
POTASSIUM: 4.5 mmol/L (ref 3.5–5.2)
Sodium: 142 mmol/L (ref 134–144)

## 2017-05-22 NOTE — Patient Instructions (Signed)
Medication Instructions:  Your physician recommends that you continue on your current medications as directed. Please refer to the Current Medication list given to you today.   Labwork: CBC, BMET  Testing/Procedures: Your physician has requested that you have a carotid duplex in October. This test is an ultrasound of the carotid arteries in your neck. It looks at blood flow through these arteries that supply the brain with blood. Allow one hour for this exam. There are no restrictions or special instructions.    Follow-Up: Your physician wants you to follow-up in: 6 months with [name]. You will receive a reminder letter in the mail two months in advance. If you don't receive a letter, please call our office to schedule the follow-up appointment.   Any Other Special Instructions Will Be Listed Below (If Applicable).     If you need a refill on your cardiac medications before your next appointment, please call your pharmacy.

## 2017-07-24 ENCOUNTER — Ambulatory Visit (INDEPENDENT_AMBULATORY_CARE_PROVIDER_SITE_OTHER): Payer: Medicare Other

## 2017-07-24 ENCOUNTER — Ambulatory Visit (HOSPITAL_COMMUNITY)
Admission: RE | Admit: 2017-07-24 | Discharge: 2017-07-24 | Disposition: A | Discharge status: Home / Self Care | Payer: Medicare Other | Source: Ambulatory Visit | Attending: Internal Medicine | Admitting: Internal Medicine

## 2017-07-24 DIAGNOSIS — I6523 Occlusion and stenosis of bilateral carotid arteries: Secondary | ICD-10-CM | POA: Insufficient documentation

## 2017-07-24 DIAGNOSIS — R0989 Other specified symptoms and signs involving the circulatory and respiratory systems: Secondary | ICD-10-CM | POA: Diagnosis not present

## 2017-07-24 DIAGNOSIS — Z23 Encounter for immunization: Secondary | ICD-10-CM

## 2017-07-29 ENCOUNTER — Telehealth: Payer: Self-pay | Admitting: Cardiovascular Disease

## 2017-07-29 NOTE — Telephone Encounter (Signed)
New message  ° ° °Patient returning call back to nurse  - test results  °

## 2017-07-29 NOTE — Telephone Encounter (Signed)
Patient aware of carotid results.

## 2017-10-16 ENCOUNTER — Other Ambulatory Visit: Payer: Self-pay | Admitting: Cardiovascular Disease

## 2017-10-16 DIAGNOSIS — I10 Essential (primary) hypertension: Secondary | ICD-10-CM

## 2017-10-16 NOTE — Telephone Encounter (Signed)
Pt last saw Dr Johnsie Cancel 05/22/17, last labs on 05/22/17 Creat 0.83, age 84, weight 62.6kg, CrCl 47.19.  Based on CrCl pt should be on Xarelto 15mg  QD.  Will forward message to Dr Johnsie Cancel to see if dosage change is appropriate for pt.  Will await response to refill rx.

## 2017-10-17 MED ORDER — RIVAROXABAN 15 MG PO TABS: 15.0000 mg | ORAL_TABLET | Freq: Every day | ORAL | 6 refills | Status: DC

## 2017-10-17 NOTE — Telephone Encounter (Signed)
Called spoke with pt, advised per Dr Johnsie Cancel OK to decrease dosage of Xarelto from 20mg  QD to 15mg  QD given most recent CrCl of 47.19.  Pt verbalized understanding but also was concerned about need to change Xarelto dosage.  Advised pt this blood work is done every 6-12 months and we will be constantly monitoring to make sure pt is on appropriate dosage of Xarelto to prevent any possible bleeding problems or complications.  Pt states understanding and is aware new rx is being sent electronically to her pharmacy.

## 2017-10-17 NOTE — Telephone Encounter (Signed)
-----   Message from Josue Hector, MD sent at 10/16/2017  6:11 PM EST ----- Ok to reduce dose to 15 mg daily  ----- Message ----- From: Brynda Peon, RN Sent: 10/16/2017  11:31 AM To: Josue Hector, MD  Pt last saw Dr Johnsie Cancel 05/22/17, last labs on 05/22/17 Creat 0.83, age 84, weight 62.6kg, CrCl 47.19.  Based on CrCl pt should be on Xarelto 15mg  QD.  Will forward message to Dr Johnsie Cancel to see if dosage change is appropriate for pt.  Will await response to refill rx. Please advise, Thanks.

## 2017-10-17 NOTE — Telephone Encounter (Signed)
Attempted to call pt to notify of dosage change of Xarelto from 20mg  QD to 15mg  QD given most recent CrCl of 47.19, LMOM TCB.

## 2017-12-03 NOTE — Progress Notes (Signed)
  Patient ID: Ashlee Warren, female   DOB: [birth year], 84 y.o.   MRN: [id number]   84 y.o. chronic afib on Xarelto with no bleeding issues. Had Ashlee Warren to NSR 03/11/12 Flecainide stopped due to QRS widening On ETT. No history of CAD. EF has been normal  CHADVASC 4 Significant anxiety ans somatization Friend Ashlee Warren helps me Calm her down. Carotid plaque with no stenosis duplex 07/24/17  Has chronic bifasicular block RBBB/LAFB no high grade AV block or syncope  Husband Ashlee Warren over 70 years had basal cell removed from right cheek and needs to be rechecked by Dr [place] Crane Xarelto dose decreased 15 mg     ROS: Denies fever, malais, weight loss, blurry vision, decreased visual acuity, cough, sputum, SOB, hemoptysis, pleuritic pain, palpitaitons, heartburn, abdominal pain, melena, lower extremity edema, claudication, or rash.  All other systems reviewed and negative  General: BP 118/78   Pulse 93   Ht 5\' 2"  (1.575 m)   Wt 134 lb (60.8 kg)   SpO2 99%   BMI 24.51 kg/m  Affect appropriate Healthy:  appears stated age 53: normal Neck supple with no adenopathy JVP normal no bruits no thyromegaly Lungs clear with no wheezing and good diaphragmatic motion Heart:  S1/S2 no murmur, no rub, gallop or click PMI normal Abdomen: benighn, BS positve, no tenderness, no AAA no bruit.  No HSM or HJR Distal pulses intact with no bruits No edema Neuro non-focal Skin warm and dry No muscular weakness    Current Outpatient Medications  Medication Sig Dispense Refill  . diltiazem (CARDIZEM CD) 240 MG 24 hr capsule TAKE 1 CAPSULE BY MOUTH DAILY 90 capsule 3  . Rivaroxaban (XARELTO) 15 MG TABS tablet Take 1 tablet (15 mg total) by mouth daily with supper. 30 tablet 6   No current facility-administered medications for this visit.     Allergies  Penicillins and Sulfonamide derivatives   Electrocardiogram: 10/14  SR rate 61 LAD normal  07/2014   NSR rate 74  RBBB LAFB PAC  11/15/15  SR ate 69  LAFB/RBBB no  change LVH 10/24/16 SR RBBB LAFB PVC rate 73  05/22/17 SR rate 77 RBBB LAFB PR 198 sinus arrhythmia  Assessment and Plan  PAF:  CHADVASC 4 on xarelto  no bleeding issues   HTN:  Well controlled.  Continue current medications and low sodium Dash type diet.   Carotid:  Some plaque no stenosis  Reviewed duplex from 07/24/17   RBBB/LAFB:  Yearly ECG no high grade heart block or syncope stable    Ashlee Warren

## 2017-12-12 ENCOUNTER — Encounter: Payer: Self-pay | Admitting: Cardiovascular Disease

## 2017-12-12 ENCOUNTER — Ambulatory Visit (INDEPENDENT_AMBULATORY_CARE_PROVIDER_SITE_OTHER): Payer: Medicare Other | Admitting: Cardiovascular Disease

## 2017-12-12 VITALS — BP 118/78 | HR 93 | Ht 62.0 in | Wt 134.0 lb

## 2017-12-12 DIAGNOSIS — I10 Essential (primary) hypertension: Secondary | ICD-10-CM | POA: Diagnosis not present

## 2017-12-12 DIAGNOSIS — I4891 Unspecified atrial fibrillation: Secondary | ICD-10-CM | POA: Diagnosis not present

## 2017-12-12 DIAGNOSIS — R0989 Other specified symptoms and signs involving the circulatory and respiratory systems: Secondary | ICD-10-CM | POA: Diagnosis not present

## 2017-12-12 NOTE — Patient Instructions (Addendum)
Medication Instructions:  Your physician recommends that you continue on your current medications as directed. Please refer to the Current Medication list given to you today.  Labwork: Your physician recommends that you return for lab work in: 3 months for BMET   Testing/Procedures: NONE  Follow-Up: Your physician wants you to follow-up in: 6 months with [name]. You will receive a reminder letter in the mail two months in advance. If you don't receive a letter, please call our office to schedule the follow-up appointment.   If you need a refill on your cardiac medications before your next appointment, please call your pharmacy.

## 2018-01-22 ENCOUNTER — Other Ambulatory Visit: Payer: Self-pay | Admitting: *Deleted

## 2018-01-22 DIAGNOSIS — I4891 Unspecified atrial fibrillation: Secondary | ICD-10-CM

## 2018-01-22 MED ORDER — DILTIAZEM HCL ER COATED BEADS 240 MG PO CP24
240.0000 mg | ORAL_CAPSULE | Freq: Every day | ORAL | 2 refills | Status: DC
Start: 2018-01-22 — End: 2018-10-21

## 2018-02-20 DIAGNOSIS — D3131 Benign neoplasm of right choroid: Secondary | ICD-10-CM | POA: Diagnosis not present

## 2018-02-20 DIAGNOSIS — H353131 Nonexudative age-related macular degeneration, bilateral, early dry stage: Secondary | ICD-10-CM | POA: Diagnosis not present

## 2018-02-20 DIAGNOSIS — Z961 Presence of intraocular lens: Secondary | ICD-10-CM | POA: Diagnosis not present

## 2018-02-20 DIAGNOSIS — H04123 Dry eye syndrome of bilateral lacrimal glands: Secondary | ICD-10-CM | POA: Diagnosis not present

## 2018-03-03 ENCOUNTER — Other Ambulatory Visit: Payer: Medicare Other | Admitting: *Deleted

## 2018-03-03 DIAGNOSIS — R0989 Other specified symptoms and signs involving the circulatory and respiratory systems: Secondary | ICD-10-CM

## 2018-03-03 DIAGNOSIS — I4891 Unspecified atrial fibrillation: Secondary | ICD-10-CM

## 2018-03-03 DIAGNOSIS — I10 Essential (primary) hypertension: Secondary | ICD-10-CM | POA: Diagnosis not present

## 2018-03-03 LAB — BASIC METABOLIC PANEL
BUN/Creatinine Ratio: 18 (ref 12–28)
BUN: 19 mg/dL (ref 8–27)
CO2: 25 mmol/L (ref 20–29)
CREATININE: 1.05 mg/dL — AB (ref 0.57–1.00)
Calcium: 9.5 mg/dL (ref 8.7–10.3)
Chloride: 105 mmol/L (ref 96–106)
GFR calc Af Amer: 55 mL/min/{1.73_m2} — ABNORMAL LOW (ref >59)
GFR calc non Af Amer: 48 mL/min/{1.73_m2} — ABNORMAL LOW (ref >59)
Glucose: 89 mg/dL (ref 65–99)
Potassium: 4.2 mmol/L (ref 3.5–5.2)
Sodium: 142 mmol/L (ref 134–144)

## 2018-03-05 ENCOUNTER — Telehealth: Payer: Self-pay | Admitting: Cardiovascular Disease

## 2018-03-05 NOTE — Telephone Encounter (Signed)
Follow Up:; ° ° °Returning your call. °

## 2018-03-05 NOTE — Telephone Encounter (Signed)
Called patient with lab results, as written below. Patient verbalized understanding.  Notes recorded by Josue Hector, MD on 03/04/2018 at 11:07 AM EDT bmet ok

## 2018-03-24 ENCOUNTER — Encounter: Payer: Self-pay | Admitting: Family Medicine

## 2018-03-24 ENCOUNTER — Ambulatory Visit (INDEPENDENT_AMBULATORY_CARE_PROVIDER_SITE_OTHER): Payer: Medicare Other | Admitting: Family Medicine

## 2018-03-24 VITALS — BP 122/70 | HR 95 | Temp 97.9°F | Ht 62.0 in | Wt 135.5 lb

## 2018-03-24 DIAGNOSIS — H6981 Other specified disorders of Eustachian tube, right ear: Secondary | ICD-10-CM | POA: Diagnosis not present

## 2018-03-24 MED ORDER — FLUTICASONE PROPIONATE 50 MCG/ACT NA SUSP: 2.0000 | Freq: Every day | NASAL | 1 refills | Status: DC

## 2018-03-24 NOTE — Progress Notes (Signed)
She is a little HOH at baseline. She was prev tested for hearing aids.  About 5 years ago she had seen Dr. Earlyne Iba with ENT.   H/o ear infection years ago.    Now with R mastoid area pain and R paraspinal muscle pain.  Heat helps more than ice for pain on the neck.  Pain is intermittent.  The R ear doesn't feel "just right."  She has taking a few motrin recently for pain.  No L sided sx.    She tried bengay.  No fevers.  No vomiting.  No rhinorrhea except for some mild AM sx.  No ST.  Sometimes stretching her neck helps some with the pain.    Meds, vitals, and allergies reviewed.   ROS: Per HPI unless specifically indicated in ROS section   nad ncat Nasal exam minimally stuffy Neither TM has any erythema.  Possible R SOM with Weber testing louder on the R ear.  Air>bone conduction B OP wnl Neck supple, no LA, she has some R paraspinal muscle tenderness.   rrr ctab

## 2018-03-24 NOTE — Patient Instructions (Addendum)
Use flonase in the meantime and gently try to pop your ears.   Keep using heat and bengay.   Update me as needed.  You may still end up needing to see ENT/Dr. Constance Holster.  Take care.  Glad to see you.

## 2018-03-25 DIAGNOSIS — H6981 Other specified disorders of Eustachian tube, right ear: Secondary | ICD-10-CM | POA: Insufficient documentation

## 2018-03-25 NOTE — Assessment & Plan Note (Signed)
Discussed with patient about Valsalva maneuver and using Flonase.  No sign of acute otitis otherwise.  She likely does have some right-sided paraspinal muscle tenderness that is a separate issue.  Can use BenGay on that, can use heat, can stretch.  Update me as needed.  Should resolve. She agrees with plan.

## 2018-04-15 ENCOUNTER — Other Ambulatory Visit: Payer: Self-pay | Admitting: Family Medicine

## 2018-05-14 ENCOUNTER — Other Ambulatory Visit: Payer: Self-pay

## 2018-05-18 ENCOUNTER — Other Ambulatory Visit: Payer: Self-pay | Admitting: Cardiovascular Disease

## 2018-05-19 NOTE — Telephone Encounter (Signed)
Xarelto 15mg  refill request received; pt is 84 yrs old, Wt-61.5kg, Crea-1.05 on 03/03/18, last seen by [name] 12/12/17, CrCl-35.32ml/min; will send in refill to requested pharmacy.

## 2018-05-21 ENCOUNTER — Ambulatory Visit (INDEPENDENT_AMBULATORY_CARE_PROVIDER_SITE_OTHER): Payer: Medicare Other

## 2018-05-21 VITALS — BP 112/64 | HR 79 | Temp 97.5°F | Ht 61.75 in | Wt 134.2 lb

## 2018-05-21 DIAGNOSIS — I10 Essential (primary) hypertension: Secondary | ICD-10-CM | POA: Diagnosis not present

## 2018-05-21 DIAGNOSIS — Z0000 Encounter for general adult medical examination without abnormal findings: Secondary | ICD-10-CM | POA: Diagnosis not present

## 2018-05-21 LAB — CBC WITH DIFFERENTIAL/PLATELET
Basophils Absolute: 0 10*3/uL (ref 0.0–0.1)
Basophils Relative: 0.4 % (ref 0.0–3.0)
EOS PCT: 2.5 % (ref 0.0–5.0)
Eosinophils Absolute: 0.2 10*3/uL (ref 0.0–0.7)
HEMATOCRIT: 43.8 % (ref 36.0–46.0)
HEMOGLOBIN: 14.7 g/dL (ref 12.0–15.0)
LYMPHS ABS: 2.1 10*3/uL (ref 0.7–4.0)
LYMPHS PCT: 29.2 % (ref 12.0–46.0)
MCHC: 33.6 g/dL (ref 30.0–36.0)
MCV: 89.4 fl (ref 78.0–100.0)
MONOS PCT: 11 % (ref 3.0–12.0)
Monocytes Absolute: 0.8 10*3/uL (ref 0.1–1.0)
NEUTROS PCT: 56.9 % (ref 43.0–77.0)
Neutro Abs: 4.1 10*3/uL (ref 1.4–7.7)
Platelets: 295 10*3/uL (ref 150.0–400.0)
RBC: 4.89 Mil/uL (ref 3.87–5.11)
RDW: 13.4 % (ref 11.5–15.5)
WBC: 7.3 10*3/uL (ref 4.0–10.5)

## 2018-05-21 LAB — LIPID PANEL
Cholesterol: 224 mg/dL — ABNORMAL HIGH (ref 0–200)
HDL: 64.1 mg/dL (ref >39.00)
LDL Cholesterol: 135 mg/dL — ABNORMAL HIGH (ref 0–99)
NONHDL: 159.83
Total CHOL/HDL Ratio: 3
Triglycerides: 124 mg/dL (ref 0.0–149.0)
VLDL: 24.8 mg/dL (ref 0.0–40.0)

## 2018-05-21 LAB — TSH: TSH: 3.02 u[IU]/mL (ref 0.35–4.50)

## 2018-05-21 NOTE — Patient Instructions (Signed)
Ashlee Warren , Thank you for taking time to come for your Medicare Wellness Visit. I appreciate your ongoing commitment to your health goals. Please review the following plan we discussed and let me know if I can assist you in the future.   These are the goals we discussed: Goals    . Increase physical activity     Starting 05/21/2018, I will continue to exercise for 45 minutes twice weekly and to do yard work as needed.        This is a list of the screening recommended for you and due dates:  Health Maintenance  Topic Date Due  . Flu Shot  01/13/2019*  . DEXA scan (bone density measurement)  03/04/2026*  . Pneumonia vaccines (1 of 2 - PCV13) 03/04/2026*  . Tetanus Vaccine  03/05/2026*  *Topic was postponed. The date shown is not the original due date.   Preventive Care for Adults  A healthy lifestyle and preventive care can promote health and wellness. Preventive health guidelines for adults include the following key practices.  . A routine yearly physical is a good way to check with your health care provider about your health and preventive screening. It is a chance to share any concerns and updates on your health and to receive a thorough exam.  . Visit your dentist for a routine exam and preventive care every 6 months. Brush your teeth twice a day and floss once a day. Good oral hygiene prevents tooth decay and gum disease.  . The frequency of eye exams is based on your age, health, family medical history, use  of contact lenses, and other factors. Follow your health care provider's recommendations for frequency of eye exams.  . Eat a healthy diet. Foods like vegetables, fruits, whole grains, low-fat dairy products, and lean protein foods contain the nutrients you need without too many calories. Decrease your intake of foods high in solid fats, added sugars, and salt. Eat the right amount of calories for you. Get information about a proper diet from your health care provider, if  necessary.  . Regular physical exercise is one of the most important things you can do for your health. Most adults should get at least 150 minutes of moderate-intensity exercise (any activity that increases your heart rate and causes you to sweat) each week. In addition, most adults need muscle-strengthening exercises on 2 or more days a week.  Silver Sneakers may be a benefit available to you. To determine eligibility, you may visit the website: www.silversneakers.com or contact program at 308-474-4603 Mon-Fri between 8AM-8PM.   . Maintain a healthy weight. The body mass index (BMI) is a screening tool to identify possible weight problems. It provides an estimate of body fat based on height and weight. Your health care provider can find your BMI and can help you achieve or maintain a healthy weight.   For adults 20 years and older: ? A BMI below 18.5 is considered underweight. ? A BMI of 18.5 to 24.9 is normal. ? A BMI of 25 to 29.9 is considered overweight. ? A BMI of 30 and above is considered obese.   . Maintain normal blood lipids and cholesterol levels by exercising and minimizing your intake of saturated fat. Eat a balanced diet with plenty of fruit and vegetables. Blood tests for lipids and cholesterol should begin at age 55 and be repeated every 5 years. If your lipid or cholesterol levels are high, you are over 50, or you are at high risk  for heart disease, you may need your cholesterol levels checked more frequently. Ongoing high lipid and cholesterol levels should be treated with medicines if diet and exercise are not working.  . If you smoke, find out from your health care provider how to quit. If you do not use tobacco, please do not start.  . If you choose to drink alcohol, please do not consume more than 2 drinks per day. One drink is considered to be 12 ounces (355 mL) of beer, 5 ounces (148 mL) of wine, or 1.5 ounces (44 mL) of liquor.  . If you are 60-18 years old, ask your  health care provider if you should take aspirin to prevent strokes.  . Use sunscreen. Apply sunscreen liberally and repeatedly throughout the day. You should seek shade when your shadow is shorter than you. Protect yourself by wearing long sleeves, pants, a wide-brimmed hat, and sunglasses year round, whenever you are outdoors.  . Once a month, do a whole body skin exam, using a mirror to look at the skin on your back. Tell your health care provider of new moles, moles that have irregular borders, moles that are larger than a pencil eraser, or moles that have changed in shape or color.

## 2018-05-21 NOTE — Progress Notes (Signed)
Subjective:   Ashlee Warren is a 84 y.o. female who presents for Medicare Annual (Subsequent) preventive examination.  Review of Systems:  N/A Cardiac Risk Factors include: advanced age (>66men, >57 women);hypertension     Objective:     Vitals: BP 112/64 (BP Location: Right Arm, Patient Position: Sitting, Cuff Size: Normal)   Pulse 79   Temp (!) 97.5 F (36.4 C) (Oral)   Ht 5' 1.75" (1.568 m) Comment: shoes  Wt 134 lb 4 oz (60.9 kg)   SpO2 98%   BMI 24.75 kg/m   Body mass index is 24.75 kg/m.  Advanced Directives 05/21/2018 03/04/2017 03/11/2013  Does Patient Have a Medical Advance Directive? No No Patient does not have advance directive  Does patient want to make changes to medical advance directive? No - Patient declined - -  Would patient like information on creating a medical advance directive? No - Patient declined - -    Tobacco Social History   Tobacco Use  Smoking Status Never Smoker  Smokeless Tobacco Never Used     Counseling given: No   Clinical Intake:  Pre-visit preparation completed: Yes  Pain : No/denies pain Pain Score: 0-No pain     Nutritional Status: BMI of 19-24  Normal Nutritional Risks: None Diabetes: No  How often do you need to have someone help you when you read instructions, pamphlets, or other written materials from your doctor or pharmacy?: 1 - Never What is the last grade level you completed in school?: 12th grade + 2 yrs college  Interpreter Needed?: No  Comments: pt lives with spouse Information entered by :: LPinson, LPN  Past Medical History:  Diagnosis Date  . Atrial fibrillation ([place])   . Dysrhythmia    afib  . Essential hypertension    Past Surgical History:  Procedure Laterality Date  . CARDIOVERSION N/A 03/11/2013   Procedure: CARDIOVERSION;  Surgeon: Josue Hector, MD;  Location: [facility];  Service: Cardiovascular;  Laterality: N/A;  . CATARACT EXTRACTION, BILATERAL  2000  . TONSILLECTOMY     Family  History  Problem Relation Age of Onset  . Cancer Mother        leukemia  . Cancer Sister    Social History   Socioeconomic History  . Marital status: Married    Spouse name: Not on file  . Number of children: Not on file  . Years of education: Not on file  . Highest education level: Not on file  Occupational History  . Not on file  Social Needs  . Financial resource strain: Not on file  . Food insecurity:    Worry: Not on file    Inability: Not on file  . Transportation needs:    Medical: Not on file    Non-medical: Not on file  Tobacco Use  . Smoking status: Never Smoker  . Smokeless tobacco: Never Used  Substance and Sexual Activity  . Alcohol use: No  . Drug use: No  . Sexual activity: Not on file  Lifestyle  . Physical activity:    Days per week: Not on file    Minutes per session: Not on file  . Stress: Not on file  Relationships  . Social connections:    Talks on phone: Not on file    Gets together: Not on file    Attends religious service: Not on file    Active member of club or organization: Not on file    Attends meetings of clubs or organizations: Not  on file    Relationship status: Not on file  Other Topics Concern  . 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 diltiazem (CARDIZEM CD) 240 MG 24 hr capsule Take 1 capsule (240 mg total) by mouth daily.  . Multiple Vitamins-Minerals (PRESERVISION AREDS 2+MULTI VIT PO) Take by mouth.  [name] 15 MG TABS tablet TAKE 1 TABLET BY MOUTH EVERY DAY WITH SUPPER  . fluticasone (FLONASE) 50 MCG/ACT nasal spray SPRAY 2 SPRAYS INTO EACH NOSTRIL EVERY DAY (Patient not taking: Reported on 05/21/2018)   No facility-administered encounter medications on file as of 05/21/2018.     Activities of Daily Living In your present state of health, do you have  any difficulty performing the following activities: 05/21/2018  Hearing? Y  Vision? N  Difficulty concentrating or making decisions? N  Walking or climbing stairs? N  Dressing or bathing? N  Doing errands, shopping? N  Preparing Food and eating ? N  Using the Toilet? N  In the past six months, have you accidently leaked urine? N  Do you have problems with loss of bowel control? N  Managing your Medications? N  Managing your Finances? N  Housekeeping or managing your Housekeeping? N  Some recent data might be hidden    Patient Care Team: Tonia Ghent, MD as PCP - General (Family Medicine)    Assessment:   This is a routine wellness examination for Ashlee Warren.   Hearing Screening   125Hz  250Hz  500Hz  1000Hz  2000Hz  3000Hz  4000Hz  6000Hz  8000Hz   Right ear:   0 0 0  0    Left ear:   40 0 40  0    Vision Screening Comments: Vision exam in 2019 with Dr. Katy Fitch    Exercise Activities and Dietary recommendations Current Exercise Habits: Structured exercise class(yardwork), Type of exercise: stretching;Other - see comments(aerobics), Time (Minutes): 45, Frequency (Times/Week): 2, Weekly Exercise (Minutes/Week): 90, Intensity: Mild, Exercise limited by: None identified  Goals    . Increase physical activity     Starting 05/21/2018, I will continue to exercise for 45 minutes twice weekly and to do yard work as needed.        Fall Risk Fall Risk  05/21/2018 03/04/2017 06/12/2016 12/09/2014 08/28/2013  Falls in the past year? No No No No No  Comment - - Emmi Telephone Survey: data to providers prior to load - -   Depression Screen PHQ 2/9 Scores 05/21/2018 03/04/2017 12/09/2014 08/28/2013  PHQ - 2 Score 0 0 0 0  PHQ- 9 Score 0 - - -     Cognitive Function MMSE - Mini Mental State Exam 05/21/2018 03/04/2017  Orientation to time 5 5  Orientation to Place 5 5  Registration 3 3  Attention/ Calculation 0 0  Recall 3 1  Recall-comments - pt was unable to recall 2 of 3 words  Language- name 2  objects 0 0  Language- repeat 1 1  Language- follow 3 step command 3 3  Language- read & follow direction 0 0  Write a sentence 0 0  Copy design 0 0  Total score 20 18     PLEASE NOTE: A Mini-Cog screen was completed. Maximum score is 20. A value of 0 denotes this part of Folstein MMSE was not completed or the patient failed this part of  the Mini-Cog screening.   Mini-Cog Screening Orientation to Time - Max 5 pts Orientation to Place - Max 5 pts Registration - Max 3 pts Recall - Max 3 pts Language Repeat - Max 1 pts Language Follow 3 Step Command - Max 3 pts     Immunization History  Administered Date(s) Administered  . Influenza,inj,Quad PF,6+ Mos 10/25/2015, 07/19/2016, 07/24/2017    Screening Tests Health Maintenance  Topic Date Due  . INFLUENZA VACCINE  01/13/2019 (Originally 05/15/2018)  . DEXA SCAN  03/04/2026 (Originally 03/19/1995)  . PNA vac Low Risk Adult (1 of 2 - PCV13) 03/04/2026 (Originally 03/19/1995)  . TETANUS/TDAP  03/05/2026 (Originally [birth year]18/1949)       Plan:   I have personally reviewed, addressed, and noted the following in the patient's chart:  A. Medical and social history B. Use of alcohol, tobacco or illicit drugs  C. Current medications and supplements D. Functional ability and status E.  Nutritional status F.  Physical activity G. Advance directives H. List of other physicians I.  Hospitalizations, surgeries, and ER visits in previous 12 months J.  [place] to include hearing, vision, cognitive, depression L. Referrals and appointments - none  In addition, I have reviewed and discussed with patient certain preventive protocols, quality metrics, and best practice recommendations. A written personalized care plan for preventive services as well as general preventive health recommendations were provided to patient.  See attached scanned questionnaire for additional information.   Signed,   Lindell Noe, MHA, BS, LPN [facility]

## 2018-05-21 NOTE — Progress Notes (Addendum)
PCP notes:   Health maintenance:  Flu vaccine - addressed  Abnormal screenings:   Hearing - failed  Hearing Screening   125Hz  250Hz  500Hz  1000Hz  2000Hz  3000Hz  4000Hz  6000Hz  8000Hz   Right ear:   0 0 0  0    Left ear:   40 0 40  0     Patient concerns:   None  Nurse concerns:  None  Next PCP appt:   05/29/18 @ 0945

## 2018-05-29 ENCOUNTER — Encounter: Payer: Self-pay | Admitting: Family Medicine

## 2018-05-29 ENCOUNTER — Ambulatory Visit (INDEPENDENT_AMBULATORY_CARE_PROVIDER_SITE_OTHER): Payer: Medicare Other | Admitting: Family Medicine

## 2018-05-29 VITALS — BP 112/64 | HR 79 | Temp 97.5°F | Ht 61.75 in | Wt 134.2 lb

## 2018-05-29 DIAGNOSIS — I4891 Unspecified atrial fibrillation: Secondary | ICD-10-CM

## 2018-05-29 DIAGNOSIS — Z0000 Encounter for general adult medical examination without abnormal findings: Secondary | ICD-10-CM | POA: Diagnosis not present

## 2018-05-29 DIAGNOSIS — H6981 Other specified disorders of Eustachian tube, right ear: Secondary | ICD-10-CM

## 2018-05-29 DIAGNOSIS — Z7189 Other specified counseling: Secondary | ICD-10-CM

## 2018-05-29 MED ORDER — FLUTICASONE PROPIONATE 50 MCG/ACT NA SUSP: NASAL | 12 refills | Status: DC

## 2018-05-29 NOTE — Progress Notes (Signed)
Hearing loss d/w pt.  She is considering getting hearing aids.  I'll defer to patient.    ETD.  Improved prev with flonase.  No ADE on med.  D/w pt.   AF.  She had cards f/u pending.  Still on CCB and xarelto.  No bleeding.  No CP. Not SOB.  No BLE edema. She doesn't usually have any skipped beats that she can feel, only rarely.  No sustained racing noted.  No ADE on meds.  D/w pt.    DXA d/w pt.  She'll declined for now.  She wanted to consider and I'll defer for now.   Mammogram 2018.  No FH breast cancer.     Advance directive d/w pt. Would want her husband, daughter Vaughan Basta, and her grandson Irine Seal all designated if patient were incapacitated. Vaccination in general d/w pt, encouraged.    PMH and SH reviewed  ROS: Per HPI unless specifically indicated in ROS section   Meds, vitals, and allergies reviewed.   GEN: nad, alert and oriented HEENT: mucous membranes moist NECK: supple w/o LA CV: IRR,  Not tachy.   PULM: ctab, no inc wob ABD: soft, +bs EXT: no edema SKIN: no acute rash

## 2018-05-29 NOTE — Patient Instructions (Signed)
I would get a flu shot each fall.   Don't change your meds for now.  Update me as needed.  Take care.  Glad to see you.

## 2018-06-01 NOTE — Assessment & Plan Note (Signed)
Advance directive d/w pt. Would want her husband, daughter Vaughan Basta, and her grandson Irine Seal all designated if patient were incapacitated.

## 2018-06-01 NOTE — Assessment & Plan Note (Signed)
Improved prev with flonase.  No ADE on med.  D/w pt.

## 2018-06-01 NOTE — Assessment & Plan Note (Signed)
DXA d/w pt.  She'll declined for now.  She wanted to consider and I'll defer for now.   Mammogram 2018.  No FH breast cancer.     Advance directive d/w pt. Would want her husband, daughter Vaughan Basta, and her grandson Irine Seal all designated if patient were incapacitated. Vaccination in general d/w pt, encouraged.

## 2018-06-01 NOTE — Assessment & Plan Note (Addendum)
She had cards f/u pending.  Still on CCB and xarelto.  No bleeding.  No CP. Not SOB.  Only rarely with a feeling of skipped beats.  No sustained tachycardia noted by patient.  Continue as is.  She agrees. >25 minutes spent in face to face time with patient, >50% spent in counselling or coordination of care.

## 2018-06-03 NOTE — Progress Notes (Signed)
  Patient ID: Ashlee Warren, female   DOB: Aug 31, 1928, 84 y.o.   MRN: [id number]   84 y.o. chronic afib on Xarelto with no bleeding issues. Had [facility] to NSR 03/11/12 Flecainide stopped due to QRS widening On ETT. No history of CAD. EF has been normal  CHADVASC 4 Significant anxiety ans somatization Friend Ashlee Warren helps me Calm her down. Carotid plaque with no stenosis duplex 07/24/17  Has chronic bifasicular block RBBB/LAFB no high grade AV block or syncope  Xarelto dose decreased 15 mg   Some paresthesias in lips that seem benign    ROS: Denies fever, malais, weight loss, blurry vision, decreased visual acuity, cough, sputum, SOB, hemoptysis, pleuritic pain, palpitaitons, heartburn, abdominal pain, melena, lower extremity edema, claudication, or rash.  All other systems reviewed and negative  General: BP 112/62   Pulse 85   Ht 5' 1.75" (1.568 m)   Wt 134 lb 9.6 oz (61.1 kg)   SpO2 98%   BMI 24.82 kg/m  Affect appropriate Healthy:  appears stated age 61: normal Neck supple with no adenopathy JVP normal no bruits no thyromegaly Lungs clear with no wheezing and good diaphragmatic motion Heart:  S1/S2 no murmur, no rub, gallop or click PMI normal Abdomen: benighn, BS positve, no tenderness, no AAA no bruit.  No HSM or HJR Distal pulses intact with no bruits No edema Neuro non-focal Skin warm and dry No muscular weakness    Current Outpatient Medications  Medication Sig Dispense Refill  . diltiazem (CARDIZEM CD) 240 MG 24 hr capsule Take 1 capsule (240 mg total) by mouth daily. 90 capsule 2  . fluticasone (FLONASE) 50 MCG/ACT nasal spray SPRAY 2 SPRAYS INTO EACH NOSTRIL EVERY DAY AS NEEDED. 16 g 12  . Multiple Vitamins-Minerals (PRESERVISION AREDS 2+MULTI VIT PO) Take by mouth.    [name] 15 MG TABS tablet TAKE 1 TABLET BY MOUTH EVERY DAY WITH SUPPER 30 tablet 5   No current facility-administered medications for this visit.     Allergies  Penicillins and Sulfonamide  derivatives   Electrocardiogram: 06/05/18 AFib rate 92 RBBB/LAFB no changes   Assessment and Plan  PAF:  CHADVASC 4 on xarelto  no bleeding issues   HTN:  Well controlled.  Continue current medications and low sodium Dash type diet.   Carotid:  Some plaque no stenosis  Reviewed duplex from 07/24/17   RBBB/LAFB:  Yearly ECG no high grade heart block or syncope stable    Jenkins Rouge

## 2018-06-05 ENCOUNTER — Encounter: Payer: Self-pay | Admitting: Cardiovascular Disease

## 2018-06-05 ENCOUNTER — Ambulatory Visit (INDEPENDENT_AMBULATORY_CARE_PROVIDER_SITE_OTHER): Payer: Medicare Other | Admitting: Cardiovascular Disease

## 2018-06-05 VITALS — BP 112/62 | HR 85 | Ht 61.75 in | Wt 134.6 lb

## 2018-06-05 DIAGNOSIS — I4891 Unspecified atrial fibrillation: Secondary | ICD-10-CM | POA: Diagnosis not present

## 2018-06-05 DIAGNOSIS — R0989 Other specified symptoms and signs involving the circulatory and respiratory systems: Secondary | ICD-10-CM

## 2018-06-05 DIAGNOSIS — I10 Essential (primary) hypertension: Secondary | ICD-10-CM | POA: Diagnosis not present

## 2018-06-05 NOTE — Patient Instructions (Signed)

## 2018-06-20 ENCOUNTER — Encounter: Payer: Self-pay | Admitting: Internal Medicine

## 2018-06-20 ENCOUNTER — Ambulatory Visit (INDEPENDENT_AMBULATORY_CARE_PROVIDER_SITE_OTHER): Payer: Medicare Other | Admitting: Internal Medicine

## 2018-06-20 ENCOUNTER — Ambulatory Visit: Payer: Medicare Other | Admitting: Family Medicine

## 2018-06-20 VITALS — BP 138/80 | HR 83 | Temp 97.5°F | Ht 61.75 in | Wt 134.0 lb

## 2018-06-20 DIAGNOSIS — M25551 Pain in right hip: Secondary | ICD-10-CM | POA: Insufficient documentation

## 2018-06-20 NOTE — Progress Notes (Signed)
Subjective:    Patient ID: Ashlee Warren, female    DOB: 04-Oct-1928, 84 y.o.   MRN: [id number]  HPI Here due to right leg pain With husband  Has intermittent pain  Can vacuum and walk--but worst thing is going to the bathroom when she bends the leg Easier sitting if she uses commode seat to support weight on arms Seemed to start along lateral right hip---now down thigh and also in groin Bending over to pick up something is almost impossible  Started 1-2 weeks ago No injury or falls Does a lot of yard work---but doesn't remember anything new  She has tried heat and cold compresses--may help somewhat Has tried 4 aleve over this time---it does help   Current Outpatient Medications on File Prior to Visit  Medication Sig Dispense Refill  . diltiazem (CARDIZEM CD) 240 MG 24 hr capsule Take 1 capsule (240 mg total) by mouth daily. 90 capsule 2  . fluticasone (FLONASE) 50 MCG/ACT nasal spray SPRAY 2 SPRAYS INTO EACH NOSTRIL EVERY DAY AS NEEDED. 16 g 12  . Multiple Vitamins-Minerals (PRESERVISION AREDS 2+MULTI VIT PO) Take by mouth.    [name] 15 MG TABS tablet TAKE 1 TABLET BY MOUTH EVERY DAY WITH SUPPER 30 tablet 5   No current facility-administered medications on file prior to visit.     Allergies  Allergen Reactions  . Penicillins     Rash R thumb  . Sulfonamide Derivatives     rash    Past Medical History:  Diagnosis Date  . Atrial fibrillation ([place])   . Dysrhythmia    afib  . Essential hypertension     Past Surgical History:  Procedure Laterality Date  . CARDIOVERSION N/A 03/11/2013   Procedure: CARDIOVERSION;  Surgeon: Josue Hector, MD;  Location: [facility];  Service: Cardiovascular;  Laterality: N/A;  . CATARACT EXTRACTION, BILATERAL  2000  . TONSILLECTOMY      Family History  Problem Relation Age of Onset  . Cancer Mother        leukemia  . Cancer Sister     Social History   Socioeconomic History  . Marital status: Married    Spouse name: Not  on file  . Number of children: Not on file  . Years of education: Not on file  . Highest education level: Not on file  Occupational History  . Not on file  Social Needs  . Financial resource strain: Not on file  . Food insecurity:    Worry: Not on file    Inability: Not on file  . Transportation needs:    Medical: Not on file    Non-medical: Not on file  Tobacco Use  . Smoking status: Never Smoker  . Smokeless tobacco: Never Used  Substance and Sexual Activity  . Alcohol use: No  . Drug use: No  . Sexual activity: Not on file  Lifestyle  . Physical activity:    Days per week: Not on file    Minutes per session: Not on file  . Stress: Not on file  Relationships  . Social connections:    Talks on phone: Not on file    Gets together: Not on file    Attends religious service: Not on file    Active member of club or organization: Not on file    Attends meetings of clubs or organizations: Not on file    Relationship status: Not on file  . Intimate partner violence:    Fear of current or  ex partner: Not on file    Emotionally abused: Not on file    Physically abused: Not on file    Forced sexual activity: Not on file  Other Topics Concern  . 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     Review of Systems  No fever and not sick No other joint problems No problems sleeping---once she gets into bed     Objective:   Physical Exam  Musculoskeletal:  No spine or posterior hip tenderness Pain point is lateral and below hip---but not really tender Normal flexion, internal and external rotation   Neurological:  Normal gait after slow getting out of chair           Assessment & Plan:

## 2018-06-20 NOTE — Assessment & Plan Note (Addendum)
Normal ROM of hip is reassuring --no sig arthritis Most likely bursitis--but not really tender there Discussed trying ice packs and regular tylenol If no better next week, will refer to ortho Ninfa Linden)

## 2018-06-20 NOTE — Patient Instructions (Signed)
I would recommend acetaminophen (tylenol) 2 tabs (325 or 500mg )-- three times a day. Try regular ice packs as well. If you are not improving by next week, I will set you up with an orthopedist

## 2018-06-27 ENCOUNTER — Telehealth: Payer: Self-pay

## 2018-06-27 DIAGNOSIS — M25551 Pain in right hip: Secondary | ICD-10-CM

## 2018-06-27 NOTE — Telephone Encounter (Signed)
Copied from [place]. Topic: Inquiry >> Jun 27, 2018  7:48 AM Conception Chancy, NT wrote: Reason for CRM: patient is calling and states she has not improved since seeing Dr. Silvio Pate on 06/20/18 and would like to know what he recommends.

## 2018-06-27 NOTE — Telephone Encounter (Signed)
Please let her know that I sent the referral and she should be hearing about this soon

## 2018-06-27 NOTE — Telephone Encounter (Signed)
Per 06/20/18 office note if pt not better in week will do orthopedist referral.Please advise.

## 2018-06-27 NOTE — Telephone Encounter (Signed)
Spoke to pt

## 2018-07-08 ENCOUNTER — Encounter (INDEPENDENT_AMBULATORY_CARE_PROVIDER_SITE_OTHER): Payer: Self-pay | Admitting: Orthopaedic Surgery

## 2018-07-08 ENCOUNTER — Ambulatory Visit (INDEPENDENT_AMBULATORY_CARE_PROVIDER_SITE_OTHER): Payer: Medicare Other

## 2018-07-08 ENCOUNTER — Ambulatory Visit (INDEPENDENT_AMBULATORY_CARE_PROVIDER_SITE_OTHER): Payer: Medicare Other | Admitting: Orthopaedic Surgery

## 2018-07-08 DIAGNOSIS — M7061 Trochanteric bursitis, right hip: Secondary | ICD-10-CM

## 2018-07-08 DIAGNOSIS — M25551 Pain in right hip: Secondary | ICD-10-CM | POA: Diagnosis not present

## 2018-07-08 MED ORDER — DICLOFENAC SODIUM 1 % TD GEL: 2.0000 g | Freq: Four times a day (QID) | TRANSDERMAL | 3 refills | Status: DC

## 2018-07-08 NOTE — Progress Notes (Signed)
Office Visit Note   Patient: Ashlee Warren           Date of Birth: Jan 08, 1928           MRN: [id number] Visit Date: 07/08/2018              Requested by: Venia Carbon, MD [place], [place] [address] PCP: Tonia Ghent, MD   Assessment & Plan: Visit Diagnoses:  1. Pain in right hip   2. Trochanteric bursitis, right hip     Plan: The patient's clinical exam and signs and symptoms are consistent with trochanteric bursitis of her right hip.  I showed her hip model explained in detail what this disease process is.  I showed her stretching exercises to try as well.  I did offer her a steroid injection in this area but she says that she is getting better and she like to defer this unless is not getting better after another week or 2.  She will call our office to be worked in for an injection with either me or Gordy Levan if it is problematic to her and not getting better.  I did send in some Voltaren gel to try.  All question concerns were answered and addressed.  Follow-Up Instructions: Return if symptoms worsen or fail to improve.   Orders:  Orders Placed This Encounter  Procedures  . XR HIP UNILAT W OR W/O PELVIS 2-3 VIEWS RIGHT   Meds ordered this encounter  Medications  . diclofenac sodium (VOLTAREN) 1 % GEL    Sig: Apply 2 g topically 4 (four) times daily.    Dispense:  100 g    Refill:  3      Procedures: No procedures performed   Clinical Data: No additional findings.   Subjective: Chief Complaint  Patient presents with  . Right Hip - Pain  The patient is a very pleasant 84 year old female who is very active and comes in for evaluation treatment of severe right hip pain.  She points the trochanteric area as a source of her pain.  She said it is very severe about 7 days ago but it significant lessen over the last few days.  Her husband is with her today.  She says she is had to increase the amount of work she is done around the house in the yard  while he recovers from surgery.  She is also done a lot of activities at her gym.  She tries to stay active.  She says it really wakes her up at night and rolling the left side hurts.  She denies any groin pain.  She does difficult getting up from sitting position sometimes.  She is been taking some Tylenol and using ice pack and that helped her the most.  She denies any pain going down to her foot or any back issues.  She denies any numbness and tingling in her feet.  She is otherwise very active and healthy 84 year old female.  HPI  Review of Systems She currently denies any headache, chest pain, shortness of breath, fever, chills, nausea, vomiting.  Objective: Vital Signs: There were no vitals taken for this visit.  Physical Exam She is alert and oriented x3 and in no acute distress Ortho Exam Examination of both hips shows a move fluidly with full internal and external rotation.  Her pain is only on the right side of the trochanteric area and IT band and she has significant tenderness to palpation over the  trochanteric area. Specialty Comments:  No specialty comments available.  Imaging: Xr Hip Unilat W Or W/o Pelvis 2-3 Views Right  Result Date: 07/08/2018 An AP pelvis and lateral of the right hip shows no acute findings.  The hip joint space is well-maintained and there are no cortical irregularities around the trochanteric area.    PMFS History: Patient Active Problem List   Diagnosis Date Noted  . Right hip pain 06/20/2018  . Dysfunction of right eustachian tube 03/25/2018  . Zoster 05/07/2017  . Healthcare maintenance 03/04/2017  . Advance care planning 10/27/2015  . Depression 07/28/2014  . Carotid bruit 06/08/2013  . Atrial fibrillation ([place]) 09/30/2012  . Essential hypertension 09/30/2012   Past Medical History:  Diagnosis Date  . Atrial fibrillation ([place])   . Dysrhythmia    afib  . Essential hypertension     Family History  Problem Relation Age of Onset  .  Cancer Mother        leukemia  . Cancer Sister     Past Surgical History:  Procedure Laterality Date  . CARDIOVERSION N/A 03/11/2013   Procedure: CARDIOVERSION;  Surgeon: Josue Hector, MD;  Location: [facility];  Service: Cardiovascular;  Laterality: N/A;  . CATARACT EXTRACTION, BILATERAL  2000  . TONSILLECTOMY     Social History   Occupational History  . Not on file  Tobacco Use  . Smoking status: Never Smoker  . Smokeless tobacco: Never Used  Substance and Sexual Activity  . Alcohol use: No  . Drug use: No  . Sexual activity: Not on file

## 2018-08-07 ENCOUNTER — Ambulatory Visit (INDEPENDENT_AMBULATORY_CARE_PROVIDER_SITE_OTHER): Payer: Medicare Other

## 2018-08-07 DIAGNOSIS — Z23 Encounter for immunization: Secondary | ICD-10-CM | POA: Diagnosis not present

## 2018-08-14 ENCOUNTER — Ambulatory Visit: Payer: Medicare Other

## 2018-09-18 ENCOUNTER — Other Ambulatory Visit: Payer: Self-pay | Admitting: *Deleted

## 2018-09-18 MED ORDER — RIVAROXABAN 15 MG PO TABS: ORAL_TABLET | ORAL | 5 refills | Status: DC

## 2018-09-18 NOTE — Telephone Encounter (Signed)
Pt is a 84 yr old female who last saw Dr Johnsie Cancel on 06/05/18. Last weight was 06/20/18 60.8Kg. Last SCr on 03/03/18 was 1.05. CrCl is 46mL/min. Will refill Xarelto 15mg  QD.

## 2018-10-21 ENCOUNTER — Other Ambulatory Visit: Payer: Self-pay | Admitting: Cardiovascular Disease

## 2018-10-21 DIAGNOSIS — I4891 Unspecified atrial fibrillation: Secondary | ICD-10-CM

## 2018-12-03 NOTE — Progress Notes (Signed)
  Patient ID: Ashlee Warren, female   DOB: 09-Aug-1928, 85 y.o.   MRN: [id number]     85 y.o. chronic afib on Xarelto with no bleeding issues. Had [facility] to NSR 03/11/12 Flecainide stopped due to QRS widening On ETT. No history of CAD. EF has been normal  CHADVASC 4 Significant anxiety ans somatization Friend [name] helps me Calm her down. Carotid plaque with no stenosis duplex 07/24/17  Has chronic bifasicular block RBBB/LAFB no high grade AV block or syncope  Xarelto dose decreased 15 mg feels her dry mouth and dreams got better With lower dose   Married 25 years Husbands health deteriorating Has neuropathy and arhtritis Hard to get around They have 2 daughters in [place] and a son in [place]   ROS: Denies fever, malais, weight loss, blurry vision, decreased visual acuity, cough, sputum, SOB, hemoptysis, pleuritic pain, palpitaitons, heartburn, abdominal pain, melena, lower extremity edema, claudication, or rash.  All other systems reviewed and negative  General: BP 120/84   Pulse 95   Ht 5' 1.75" (1.568 m)   Wt 62.1 kg   SpO2 98%   BMI 25.26 kg/m  Affect appropriate Healthy:  appears stated age 29: normal Neck supple with no adenopathy JVP normal no bruits no thyromegaly Lungs clear with no wheezing and good diaphragmatic motion Heart:  S1/S2 no murmur, no rub, gallop or click PMI normal Abdomen: benighn, BS positve, no tenderness, no AAA no bruit.  No HSM or HJR Distal pulses intact with no bruits No edema Neuro non-focal Skin warm and dry No muscular weakness    Current Outpatient Medications  Medication Sig Dispense Refill  . diclofenac sodium (VOLTAREN) 1 % GEL Apply 2 g topically 4 (four) times daily. 100 g 3  . diltiazem (CARDIZEM CD) 240 MG 24 hr capsule TAKE 1 CAPSULE BY MOUTH EVERY DAY 90 capsule 2  . fluticasone (FLONASE) 50 MCG/ACT nasal spray SPRAY 2 SPRAYS INTO EACH NOSTRIL EVERY DAY AS NEEDED. 16 g 12  . Multiple Vitamins-Minerals (PRESERVISION AREDS  2+MULTI VIT PO) Take by mouth.    . Rivaroxaban (XARELTO) 15 MG TABS tablet TAKE 1 TABLET BY MOUTH EVERY DAY WITH SUPPER 30 tablet 5   No current facility-administered medications for this visit.     Allergies  Penicillins and Sulfonamide derivatives   Electrocardiogram: 06/05/18 AFib rate 92 RBBB/LAFB no changes   Assessment and Plan  PAF:  CHADVASC 4 on xarelto  no bleeding issues   HTN:  Well controlled.  Continue current medications and low sodium Dash type diet.   Carotid:  Some plaque no stenosis  Reviewed duplex from 07/24/17   RBBB/LAFB:  Yearly ECG no high grade heart block or syncope stable    Jenkins Rouge

## 2018-12-08 ENCOUNTER — Encounter: Payer: Self-pay | Admitting: Cardiovascular Disease

## 2018-12-08 ENCOUNTER — Ambulatory Visit (INDEPENDENT_AMBULATORY_CARE_PROVIDER_SITE_OTHER): Payer: Medicare Other | Admitting: Cardiovascular Disease

## 2018-12-08 VITALS — BP 120/84 | HR 95 | Ht 61.75 in | Wt 137.0 lb

## 2018-12-08 DIAGNOSIS — I10 Essential (primary) hypertension: Secondary | ICD-10-CM | POA: Diagnosis not present

## 2018-12-08 DIAGNOSIS — I480 Paroxysmal atrial fibrillation: Secondary | ICD-10-CM

## 2018-12-08 DIAGNOSIS — R0989 Other specified symptoms and signs involving the circulatory and respiratory systems: Secondary | ICD-10-CM | POA: Diagnosis not present

## 2018-12-08 DIAGNOSIS — I4510 Unspecified right bundle-branch block: Secondary | ICD-10-CM | POA: Diagnosis not present

## 2018-12-08 NOTE — Patient Instructions (Addendum)

## 2019-02-17 ENCOUNTER — Other Ambulatory Visit: Payer: Self-pay

## 2019-02-17 ENCOUNTER — Ambulatory Visit (INDEPENDENT_AMBULATORY_CARE_PROVIDER_SITE_OTHER): Payer: Medicare Other | Admitting: Orthopaedic Surgery

## 2019-02-17 DIAGNOSIS — M7061 Trochanteric bursitis, right hip: Secondary | ICD-10-CM

## 2019-02-17 MED ORDER — LIDOCAINE HCL 1 % IJ SOLN
3.0000 mL | INTRAMUSCULAR | Status: AC | PRN
  Administered 2019-02-17: 3 mL

## 2019-02-17 MED ORDER — METHYLPREDNISOLONE ACETATE 40 MG/ML IJ SUSP
40.0000 mg | INTRAMUSCULAR | Status: AC | PRN
  Administered 2019-02-17: 40 mg via INTRA_ARTICULAR

## 2019-02-17 NOTE — Progress Notes (Signed)
Office Visit Note   Patient: Ashlee Warren           Date of Birth: 02-18-1928           MRN: [id number] Visit Date: 02/17/2019              Requested by: Tonia Ghent, MD [address] [place], [place] [address] PCP: Tonia Ghent, MD   Assessment & Plan: Visit Diagnoses:  1. Trochanteric bursitis, right hip     Plan: I did place a steroid injection around the trochanteric area of her right hip after explained the risk and benefits of injections.  I do feel she is appropriate candidate for physical therapy as an outpatient and gave her prescription for this.  All questions concerns were answered and addressed.  We will see her back in 6 weeks to see how she is doing overall.  Follow-Up Instructions: Return in about 6 weeks (around 03/31/2019).   Orders:  Orders Placed This Encounter  Procedures  . Large Joint Inj   No orders of the defined types were placed in this encounter.     Procedures: Large Joint Inj: R greater trochanter on 02/17/2019 9:41 AM Indications: pain and diagnostic evaluation Details: 22 G 1.5 in needle, lateral approach  Arthrogram: No  Medications: 3 mL lidocaine 1 %; 40 mg methylPREDNISolone acetate 40 MG/ML Outcome: tolerated well, no immediate complications Procedure, treatment alternatives, risks and benefits explained, specific risks discussed. Consent was given by the patient. Immediately prior to procedure a time out was called to verify the correct patient, procedure, equipment, support staff and site/side marked as required. Patient was prepped and draped in the usual sterile fashion.       Clinical Data: No additional findings.   Subjective: Chief Complaint  Patient presents with  . Right Hip - Pain  The patient is well-known to me.  She is a very pleasant 85 year old female with right hip pain that we have felt is more consistent with trochanteric bursitis and a component of sciatica as well.  X-rays do confirm this.  We  offered her injection at her last visit but she wanted to just try stretching exercises.  She still having a lot of problems with the pain flareup this just not going away at this time.  She would like to come in for an injection today and then any other modalities that we can suggest to help her have less pain and more mobility.  HPI  Review of Systems She currently denies any headache, chest pain, shortness of breath, fever, chills, nausea, vomiting  Objective: Vital Signs: There were no vitals taken for this visit.  Physical Exam She is alert and orient x3 and in no acute distress Ortho Exam Examination of her right hip shows no pain in the groin with good range of motion overall.  Her pain seems to be over the trochanteric area to palpation and some of the sciatic area.  She ambulates slowly but without an assistive device. Specialty Comments:  No specialty comments available.  Imaging: No results found.   PMFS History: Patient Active Problem List   Diagnosis Date Noted  . Right hip pain 06/20/2018  . Dysfunction of right eustachian tube 03/25/2018  . Zoster 05/07/2017  . Healthcare maintenance 03/04/2017  . Advance care planning 10/27/2015  . Depression 07/28/2014  . Carotid bruit 06/08/2013  . Atrial fibrillation ([place]) 09/30/2012  . Essential hypertension 09/30/2012   Past Medical History:  Diagnosis Date  .  Atrial fibrillation ([place])   . Dysrhythmia    afib  . Essential hypertension     Family History  Problem Relation Age of Onset  . Cancer Mother        leukemia  . Cancer Sister     Past Surgical History:  Procedure Laterality Date  . CARDIOVERSION N/A 03/11/2013   Procedure: CARDIOVERSION;  Surgeon: Josue Hector, MD;  Location: [facility];  Service: Cardiovascular;  Laterality: N/A;  . CATARACT EXTRACTION, BILATERAL  2000  . TONSILLECTOMY     Social History   Occupational History  . Not on file  Tobacco Use  . Smoking status: Never Smoker  .  Smokeless tobacco: Never Used  Substance and Sexual Activity  . Alcohol use: No  . Drug use: No  . Sexual activity: Not on file

## 2019-02-23 DIAGNOSIS — M25551 Pain in right hip: Secondary | ICD-10-CM | POA: Diagnosis not present

## 2019-02-27 DIAGNOSIS — M25551 Pain in right hip: Secondary | ICD-10-CM | POA: Diagnosis not present

## 2019-03-02 DIAGNOSIS — M25551 Pain in right hip: Secondary | ICD-10-CM | POA: Diagnosis not present

## 2019-03-03 ENCOUNTER — Telehealth: Payer: Self-pay | Admitting: Orthopaedic Surgery

## 2019-03-03 NOTE — Telephone Encounter (Signed)
Please advise 

## 2019-03-03 NOTE — Telephone Encounter (Signed)
Patients husband called in wanting to see if Dr.Blackman would see the note the PT sent him, as well as prescribe something a little stronger for pain. Her pain level is getting worse from PT and just every day movement

## 2019-03-04 MED ORDER — ACETAMINOPHEN-CODEINE #3 300-30 MG PO TABS: 1.0000 | ORAL_TABLET | Freq: Three times a day (TID) | ORAL | 0 refills | Status: DC | PRN

## 2019-03-04 NOTE — Telephone Encounter (Signed)
I called patient's husband and advised.  

## 2019-03-04 NOTE — Telephone Encounter (Signed)
I sent in some tylenol#3 with codeine

## 2019-03-06 ENCOUNTER — Telehealth: Payer: Self-pay | Admitting: Orthopaedic Surgery

## 2019-03-06 NOTE — Telephone Encounter (Signed)
Pt called in said she had an injection done with dr.blackman and also did physical therapy and she said still after all that the pain has not changed she still happens to be in a lot of pain. [phone]

## 2019-03-06 NOTE — Telephone Encounter (Signed)
I am not sure what else we can recommend.  Voltaren gel will be over-the-counter soon and she can pick that up at [facility] which is the cream that she can rub on this hip up to 4 times a day.  We will just need to see her in her follow-up appointment which is soon I believe.

## 2019-03-06 NOTE — Telephone Encounter (Signed)
Please advise 

## 2019-03-07 ENCOUNTER — Ambulatory Visit (INDEPENDENT_AMBULATORY_CARE_PROVIDER_SITE_OTHER): Payer: Medicare Other

## 2019-03-07 ENCOUNTER — Encounter (HOSPITAL_COMMUNITY): Payer: Self-pay

## 2019-03-07 ENCOUNTER — Other Ambulatory Visit: Payer: Self-pay

## 2019-03-07 ENCOUNTER — Ambulatory Visit (HOSPITAL_COMMUNITY)
Admission: EM | Admit: 2019-03-07 | Discharge: 2019-03-07 | Disposition: A | Discharge status: Home / Self Care | Payer: Medicare Other | Attending: Physician Assistant | Admitting: Physician Assistant

## 2019-03-07 DIAGNOSIS — R52 Pain, unspecified: Secondary | ICD-10-CM | POA: Diagnosis not present

## 2019-03-07 DIAGNOSIS — M25551 Pain in right hip: Secondary | ICD-10-CM

## 2019-03-07 DIAGNOSIS — M25561 Pain in right knee: Secondary | ICD-10-CM | POA: Diagnosis not present

## 2019-03-07 MED ORDER — OXYCODONE-ACETAMINOPHEN 5-325 MG PO TABS: 1.0000 | ORAL_TABLET | Freq: Four times a day (QID) | ORAL | 0 refills | Status: DC | PRN

## 2019-03-07 NOTE — ED Triage Notes (Signed)
Pt  states she has bursitis  in her right hip. She has been taking meds and but she says the meds worked for a while. Now the pain is back. Pt states she has pain in her right hip, groin pain and right knee

## 2019-03-07 NOTE — ED Provider Notes (Signed)
[facility]    CSN: [id number] Arrival date & time: 03/07/19  1142     History   Chief Complaint Chief Complaint  Patient presents with   Hip Pain    HPI Ashlee Warren is a 85 y.o. female.   The history is provided by the patient. No language interpreter was used.  Hip Pain  This is a new problem. The current episode started yesterday. The problem occurs constantly. The problem has not changed since onset.Nothing aggravates the symptoms. Nothing relieves the symptoms. She has tried nothing for the symptoms. The treatment provided no relief.  Pt reports she has hip bursitis.  Pt reports pain in her hip and her knee that are not relieved by tylenol with codeine.  Pt is on blood thinners  Past Medical History:  Diagnosis Date   Atrial fibrillation [facility])    Dysrhythmia    afib   Essential hypertension     Patient Active Problem List   Diagnosis Date Noted   Right hip pain 06/20/2018   Dysfunction of right eustachian tube 03/25/2018   Zoster 05/07/2017   Healthcare maintenance 03/04/2017   Advance care planning 10/27/2015   Depression 07/28/2014   Carotid bruit 06/08/2013   Atrial fibrillation ([place]) 09/30/2012   Essential hypertension 09/30/2012    Past Surgical History:  Procedure Laterality Date   CARDIOVERSION N/A 03/11/2013   Procedure: CARDIOVERSION;  Surgeon: Josue Hector, MD;  Location: [facility];  Service: Cardiovascular;  Laterality: N/A;   CATARACT EXTRACTION, BILATERAL  2000   TONSILLECTOMY      OB History   No obstetric history on file.      Home Medications    Prior to Admission medications   Medication Sig Start Date End Date Taking? Authorizing Provider  acetaminophen-codeine (TYLENOL #3) 300-30 MG tablet Take 1-2 tablets by mouth every 8 (eight) hours as needed for moderate pain. 03/04/19   Mcarthur Rossetti, MD  diclofenac sodium (VOLTAREN) 1 % GEL Apply 2 g topically 4 (four) times daily. 07/08/18    Mcarthur Rossetti, MD  diltiazem (CARDIZEM CD) 240 MG 24 hr capsule TAKE 1 CAPSULE BY MOUTH EVERY DAY 10/21/18   Josue Hector, MD  fluticasone [facility]) 50 MCG/ACT nasal spray SPRAY 2 SPRAYS INTO EACH NOSTRIL EVERY DAY AS NEEDED. 05/29/18   Tonia Ghent, MD  Multiple Vitamins-Minerals (PRESERVISION AREDS 2+MULTI VIT PO) Take by mouth.    [provider]  oxyCODONE-acetaminophen (PERCOCET) 5-325 MG tablet Take 1 tablet by mouth every 6 (six) hours as needed for severe pain. 03/07/19 03/06/20  Fransico Meadow, PA-C  Rivaroxaban (XARELTO) 15 MG TABS tablet TAKE 1 TABLET BY MOUTH EVERY DAY WITH SUPPER 09/18/18   Josue Hector, MD    Family History Family History  Problem Relation Age of Onset   Cancer Mother        leukemia   Cancer Sister     Social History Social History   Tobacco Use   Smoking status: Never Smoker   Smokeless tobacco: Never Used  Substance Use Topics   Alcohol use: No   Drug use: No     Allergies   Penicillins and Sulfonamide derivatives   Review of Systems Review of Systems  Musculoskeletal: Positive for arthralgias, gait problem and myalgias. Negative for joint swelling.  All other systems reviewed and are negative.    Physical Exam Triage Vital Signs ED Triage Vitals  Enc Vitals Group     BP 03/07/19 1208 (!) 155/97  Pulse Rate 03/07/19 1208 100     Resp 03/07/19 1208 18     Temp 03/07/19 1208 97.6 F (36.4 C)     Temp Source 03/07/19 1208 Oral     SpO2 03/07/19 1208 97 %     Weight --      Height --      Head Circumference --      Peak Flow --      Pain Score 03/07/19 1211 8     Pain Loc --      Pain Edu? --      Excl. in [place]? --    No data found.  Updated Vital Signs BP (!) 155/97 (BP Location: Right Arm)    Pulse 100    Temp 97.6 F (36.4 C) (Oral)    Resp 18    SpO2 97%   Visual Acuity Right Eye Distance:   Left Eye Distance:   Bilateral Distance:    Right Eye Near:   Left Eye Near:    Bilateral  Near:     Physical Exam Vitals signs and nursing note reviewed.  Constitutional:      General: She is not in acute distress.    Appearance: She is well-developed.  HENT:     Head: Normocephalic and atraumatic.  Eyes:     Conjunctiva/sclera: Conjunctivae normal.  Neck:     Musculoskeletal: Neck supple.  Cardiovascular:     Rate and Rhythm: Normal rate and regular rhythm.     Heart sounds: No murmur.  Pulmonary:     Effort: Pulmonary effort is normal. No respiratory distress.     Breath sounds: Normal breath sounds.  Abdominal:     Palpations: Abdomen is soft.     Tenderness: There is no abdominal tenderness.  Musculoskeletal:        General: Tenderness present.     Comments: Tender right hip, tender right knee,  Swollen right knee, nv and ns intact   Skin:    General: Skin is warm and dry.  Neurological:     General: No focal deficit present.     Mental Status: She is alert.      UC Treatments / Results  Labs (all labs ordered are listed, but only abnormal results are displayed) Labs Reviewed - No data to display  EKG None  Radiology Dg Knee Complete 4 Views Right  Result Date: 03/07/2019 CLINICAL DATA:  Pain EXAM: RIGHT KNEE - COMPLETE 4+ VIEW COMPARISON:  None. FINDINGS: Frontal, lateral, and bilateral oblique views were obtained. There is no fracture or dislocation. No joint effusion. There is prominence of the prepatellar soft tissue. No increased opacity is suggest hematoma seen in this area. There Is no appreciable joint space narrowing. There are foci of popliteal artery atherosclerotic calcification. IMPRESSION: Mild soft tissue prominence in the prepatellar region of questionable significance. No fracture or dislocation. No joint effusion. Joint spaces appear unremarkable. There is popliteal artery atherosclerosis. Electronically Signed   By: Lowella Grip III M.D.   On: 03/07/2019 12:56   Dg Hip Unilat With Pelvis 2-3 Views Right  Result Date:  03/07/2019 CLINICAL DATA:  Pain EXAM: DG HIP (WITH OR WITHOUT PELVIS) 2-3V RIGHT COMPARISON:  None. FINDINGS: Frontal pelvis as well as frontal and lateral right hip images were obtained. Bones appear somewhat osteoporotic. No fracture or dislocation. Joint spaces appear normal. No erosive change. IMPRESSION: Bones appear somewhat osteoporotic. No fracture or dislocation. No evident arthropathy. Electronically Signed   By: Lowella Grip  III M.D.   On: 03/07/2019 12:55    Procedures Procedures (including critical care time)  Medications Ordered in UC Medications - No data to display  Initial Impression / Assessment and Plan / UC Course  I have reviewed the triage vital signs and the nursing notes.  Pertinent labs & imaging results that were available during my care of the patient were reviewed by me and considered in my medical decision making (see chart for details).    MDM   Pt counseled on results. Pt given rx for percocet.  Pt's daughter will be with her.  Pt advised to try taking a half tablet.   Call your Orthopaedist to be seen next week.  Final Clinical Impressions(s) / UC Diagnoses   Final diagnoses:  Pain  Right hip pain     Discharge Instructions     Return if any problems. See your Orthopaedist for recheck    ED Prescriptions    Medication Sig Dispense Auth. Provider   oxyCODONE-acetaminophen (PERCOCET) 5-325 MG tablet Take 1 tablet by mouth every 6 (six) hours as needed for severe pain. 16 tablet Fransico Meadow, [place]     Controlled Substance Prescriptions [place] Controlled Substance Registry consulted? Not Applicable  An After Visit Summary was printed and given to the patient.    Fransico Meadow, [place] 03/07/19 1408

## 2019-03-07 NOTE — Discharge Instructions (Signed)
Return if any problems. See your Orthopaedist for recheck

## 2019-03-10 NOTE — Telephone Encounter (Signed)
Patient aware and will be here at her scheduled appt

## 2019-03-17 ENCOUNTER — Other Ambulatory Visit: Payer: Self-pay | Admitting: Orthopaedic Surgery

## 2019-03-17 NOTE — Telephone Encounter (Signed)
Please advise 

## 2019-03-22 ENCOUNTER — Other Ambulatory Visit: Payer: Self-pay | Admitting: Cardiovascular Disease

## 2019-03-23 NOTE — Telephone Encounter (Signed)
Age 85, weight 62.1kg, SCr 1.05 on 03/03/18, CrCl 68mL/min, last OV on 12/08/18 with Nishan, afib indication. Will have new BMET drawn at PCP visit in August

## 2019-03-26 ENCOUNTER — Encounter: Payer: Self-pay | Admitting: Orthopaedic Surgery

## 2019-03-26 ENCOUNTER — Ambulatory Visit (INDEPENDENT_AMBULATORY_CARE_PROVIDER_SITE_OTHER): Payer: Medicare Other | Admitting: Orthopaedic Surgery

## 2019-03-26 ENCOUNTER — Other Ambulatory Visit: Payer: Self-pay

## 2019-03-26 DIAGNOSIS — M7061 Trochanteric bursitis, right hip: Secondary | ICD-10-CM | POA: Diagnosis not present

## 2019-03-26 NOTE — Progress Notes (Signed)
The patient is still dealing with debilitating right hip pain and right knee pain.  It bothered her significantly enough that she went to the emergency room on 03/07/2019.  We did a steroid injection around the trochanteric bursa area in early May.  She is on Voltaren gel as well.  She actually had to take some Percocet due to the pain she is having.  She mobilizes with a walker and is frustrated because she cannot be as active as she is at 85 years old.  X-rays in the emergency room were reviewed and were negative for any type of fracture around her hip or knee.  There is not even significant arthritic findings.  On exam today she is able to easily walk around the exam room and get up and down from a chair.  She has pain to palpation of the trochanteric area in her knee but no pain in the groin and her right hip motion is fluid actively and passively.  I did review the x-rays from emergency room as well and did not see any significant pathology around her right hip or knee.  Only thing to do is give her reassurance that nothing is broken and that hopefully with time this pain will subside.  I do feel it is essential she can back into physical therapy and continue to try Voltaren gel on her hip.  I have discouraged the use of narcotics.  We can see her back in 6 weeks at least try 1 more steroid injection around the trochanteric area if needed.  All question concerns were answered and addressed.

## 2019-03-30 DIAGNOSIS — M25551 Pain in right hip: Secondary | ICD-10-CM | POA: Diagnosis not present

## 2019-04-02 ENCOUNTER — Ambulatory Visit (INDEPENDENT_AMBULATORY_CARE_PROVIDER_SITE_OTHER): Payer: Medicare Other | Admitting: Family Medicine

## 2019-04-02 ENCOUNTER — Ambulatory Visit: Payer: Medicare Other | Admitting: Family Medicine

## 2019-04-02 ENCOUNTER — Other Ambulatory Visit: Payer: Self-pay

## 2019-04-02 DIAGNOSIS — R609 Edema, unspecified: Secondary | ICD-10-CM | POA: Diagnosis not present

## 2019-04-02 DIAGNOSIS — M719 Bursopathy, unspecified: Secondary | ICD-10-CM

## 2019-04-02 DIAGNOSIS — M25551 Pain in right hip: Secondary | ICD-10-CM | POA: Diagnosis not present

## 2019-04-02 MED ORDER — ACETAMINOPHEN 500 MG PO TABS: 500.0000 mg | ORAL_TABLET | Freq: Three times a day (TID) | ORAL | Status: DC | PRN

## 2019-04-02 NOTE — Progress Notes (Signed)
Virtual visit completed through WebEx or similar program Patient location: home  Provider location: Financial controller at [facility], office   Pandemic considerations d/w pt.   Limitations and rationale for visit method d/w patient.  Patient agreed to proceed.   CC: bursitis, edema.  HPI: has seen ortho and has been in PT.  Using voltaren gel and tylenol prn with relief.  PT really seemed to help patient.    156/73, pulse 91.  She reportedly has 2+ BLE at the ankles.  Her knees feel a little tight.  Swelling has been present for a few months.    Weight today was 132 lbs on her scales.  This is similar to prev.  No CP, SOB.  Not dyspnea supine.  She is on CCB at baseline.  She is elevating her feet at night. No palpitations.    Husband had noted 2 respiratory pauses at night that didn't wake patient.  She is not snoring to the point of waking herself.  She does not have a diagnosis of sleep apnea.  Meds and allergies reviewed.   ROS: Per HPI unless specifically indicated in ROS section   NAD Speech wnl  A/P:  Bursitis.  Reportedly improved with Voltaren gel and Tylenol and PT.  Edema. Noted by patient. Weight today was 132 lbs on her scales.  This is similar to prev.  No CP, SOB.  Not dyspnea supine.  She is on CCB at baseline.  She is elevating her feet at night. No palpitations.   I will update cardiology. She will come in for lab visit, we will get this set up. Limits salt at baseline.   No change in meds at this point.

## 2019-04-05 ENCOUNTER — Telehealth: Payer: Self-pay | Admitting: Family Medicine

## 2019-04-05 DIAGNOSIS — M719 Bursopathy, unspecified: Secondary | ICD-10-CM | POA: Insufficient documentation

## 2019-04-05 DIAGNOSIS — R609 Edema, unspecified: Secondary | ICD-10-CM | POA: Insufficient documentation

## 2019-04-05 NOTE — Telephone Encounter (Signed)
Routed to [name] for input/FYI.  If you have preferences about sleep study and/or alternative agent for blood pressure control to please let me know.  Husband had noted 2 respiratory pauses at night that didn't wake patient.  She is not snoring to the point of waking herself.  She does not have a diagnosis of sleep apnea.  She also has increased peripheral edema and she still on a calcium channel blocker.  We are going to get her in for labs.  I did not change her medications yet.  She not short of breath and her weight is stable.  She is limiting salt already.  Thanks.

## 2019-04-05 NOTE — Assessment & Plan Note (Signed)
Reportedly improved with Voltaren gel and Tylenol and PT.

## 2019-04-05 NOTE — Assessment & Plan Note (Signed)
Noted by patient. Weight today was 132 lbs on her scales.  This is similar to prev.  No CP, SOB.  Not dyspnea supine.  She is on CCB at baseline.  She is elevating her feet at night. No palpitations.   I will update cardiology. She will come in for lab visit, we will get this set up. Limits salt at baseline.   No change in meds at this point.

## 2019-04-06 DIAGNOSIS — M25551 Pain in right hip: Secondary | ICD-10-CM | POA: Diagnosis not present

## 2019-04-06 NOTE — Telephone Encounter (Signed)
At 7 not sure I would do sleep study Talyn is very particular about meds and seems to find reasons not to take them Your can try what you want but I wouldn't be too aggressive about it

## 2019-04-07 ENCOUNTER — Other Ambulatory Visit (INDEPENDENT_AMBULATORY_CARE_PROVIDER_SITE_OTHER): Payer: Medicare Other

## 2019-04-07 DIAGNOSIS — R609 Edema, unspecified: Secondary | ICD-10-CM

## 2019-04-07 NOTE — Telephone Encounter (Signed)
Yes, in the next few days.  Thanks.

## 2019-04-07 NOTE — Telephone Encounter (Signed)
Patient is scheduled for August, did you want an appointment sooner?

## 2019-04-07 NOTE — Telephone Encounter (Signed)
Please get patient set up for lab visit if this has not yet been done by the time you get this note.  Orders are in EMR.  Thanks.

## 2019-04-08 DIAGNOSIS — M25551 Pain in right hip: Secondary | ICD-10-CM | POA: Diagnosis not present

## 2019-04-08 LAB — COMPREHENSIVE METABOLIC PANEL
ALT: 20 U/L (ref 0–35)
AST: 19 U/L (ref 0–37)
Albumin: 3.9 g/dL (ref 3.5–5.2)
Alkaline Phosphatase: 72 U/L (ref 39–117)
BUN: 18 mg/dL (ref 6–23)
CO2: 30 mEq/L (ref 19–32)
Calcium: 9.4 mg/dL (ref 8.4–10.5)
Chloride: 107 mEq/L (ref 96–112)
Creatinine, Ser: 0.95 mg/dL (ref 0.40–1.20)
GFR: 55.38 mL/min — ABNORMAL LOW (ref >60.00)
Glucose, Bld: 103 mg/dL — ABNORMAL HIGH (ref 70–99)
Potassium: 4.3 mEq/L (ref 3.5–5.1)
Sodium: 143 mEq/L (ref 135–145)
Total Bilirubin: 0.4 mg/dL (ref 0.2–1.2)
Total Protein: 6.4 g/dL (ref 6.0–8.3)

## 2019-04-08 LAB — CBC WITH DIFFERENTIAL/PLATELET
Basophils Absolute: 0.1 10*3/uL (ref 0.0–0.1)
Basophils Relative: 1 % (ref 0.0–3.0)
Eosinophils Absolute: 0.2 10*3/uL (ref 0.0–0.7)
Eosinophils Relative: 2.1 % (ref 0.0–5.0)
HCT: 41.6 % (ref 36.0–46.0)
Hemoglobin: 13.7 g/dL (ref 12.0–15.0)
Lymphocytes Relative: 30.2 % (ref 12.0–46.0)
Lymphs Abs: 2.6 10*3/uL (ref 0.7–4.0)
MCHC: 33 g/dL (ref 30.0–36.0)
MCV: 92.2 fl (ref 78.0–100.0)
Monocytes Absolute: 0.8 10*3/uL (ref 0.1–1.0)
Monocytes Relative: 9.5 % (ref 3.0–12.0)
Neutro Abs: 4.8 10*3/uL (ref 1.4–7.7)
Neutrophils Relative %: 57.2 % (ref 43.0–77.0)
Platelets: 274 10*3/uL (ref 150.0–400.0)
RBC: 4.51 Mil/uL (ref 3.87–5.11)
RDW: 13.7 % (ref 11.5–15.5)
WBC: 8.5 10*3/uL (ref 4.0–10.5)

## 2019-04-08 LAB — TSH: TSH: 1.54 u[IU]/mL (ref 0.35–4.50)

## 2019-04-10 ENCOUNTER — Other Ambulatory Visit: Payer: Self-pay | Admitting: Family Medicine

## 2019-04-10 DIAGNOSIS — I4891 Unspecified atrial fibrillation: Secondary | ICD-10-CM

## 2019-04-10 MED ORDER — DILTIAZEM HCL ER COATED BEADS 180 MG PO CP24: 180.0000 mg | ORAL_CAPSULE | Freq: Every day | ORAL | 3 refills | Status: DC

## 2019-04-14 DIAGNOSIS — M25551 Pain in right hip: Secondary | ICD-10-CM | POA: Diagnosis not present

## 2019-04-16 DIAGNOSIS — M25551 Pain in right hip: Secondary | ICD-10-CM | POA: Diagnosis not present

## 2019-04-22 ENCOUNTER — Telehealth: Payer: Self-pay | Admitting: Family Medicine

## 2019-04-22 DIAGNOSIS — M25551 Pain in right hip: Secondary | ICD-10-CM | POA: Diagnosis not present

## 2019-04-22 NOTE — Telephone Encounter (Signed)
Best number [phone]  Pt called to give weight and bp   Date  bp  Pulse weight 6/28  111/74  89 125 6/29  134/91  83 129 6/30  156/75  86 129.8 7/1  125/74  82 128 7/2  138/75  87 128 7/3  141/68  80 127 7/4  121/67  88 127 7/5  124/61  84 127 7/8  147/82  100 130.2 7/8 was done 10 min after PT

## 2019-04-26 NOTE — Telephone Encounter (Signed)
Those blood pressure and pulse readings are reasonable.  Is her swelling any better on the lower dose of diltiazem?  Please let me know.  Thanks.

## 2019-04-27 NOTE — Telephone Encounter (Signed)
Patient advised.   Swelling is better on the lower dose of diltiazem, not all the way down but much better.  Patient says she is doing fine.

## 2019-04-27 NOTE — Telephone Encounter (Signed)
Noted. Thanks. Would continue as is.

## 2019-04-28 DIAGNOSIS — M25551 Pain in right hip: Secondary | ICD-10-CM | POA: Diagnosis not present

## 2019-04-30 DIAGNOSIS — M25551 Pain in right hip: Secondary | ICD-10-CM | POA: Diagnosis not present

## 2019-05-05 DIAGNOSIS — M25551 Pain in right hip: Secondary | ICD-10-CM | POA: Diagnosis not present

## 2019-05-06 ENCOUNTER — Ambulatory Visit: Payer: Medicare Other | Admitting: Orthopaedic Surgery

## 2019-05-27 ENCOUNTER — Ambulatory Visit: Payer: Medicare Other

## 2019-05-27 ENCOUNTER — Other Ambulatory Visit (INDEPENDENT_AMBULATORY_CARE_PROVIDER_SITE_OTHER): Payer: Medicare Other

## 2019-05-27 ENCOUNTER — Other Ambulatory Visit: Payer: Self-pay

## 2019-05-27 ENCOUNTER — Other Ambulatory Visit: Payer: Self-pay | Admitting: Family Medicine

## 2019-05-27 DIAGNOSIS — I10 Essential (primary) hypertension: Secondary | ICD-10-CM

## 2019-05-27 LAB — LIPID PANEL
Cholesterol: 250 mg/dL — ABNORMAL HIGH (ref 0–200)
HDL: 61.7 mg/dL (ref >39.00)
LDL Cholesterol: 158 mg/dL — ABNORMAL HIGH (ref 0–99)
NonHDL: 188.5
Total CHOL/HDL Ratio: 4
Triglycerides: 154 mg/dL — ABNORMAL HIGH (ref 0.0–149.0)
VLDL: 30.8 mg/dL (ref 0.0–40.0)

## 2019-06-05 ENCOUNTER — Ambulatory Visit (INDEPENDENT_AMBULATORY_CARE_PROVIDER_SITE_OTHER): Payer: Medicare Other | Admitting: Family Medicine

## 2019-06-05 ENCOUNTER — Encounter: Payer: Self-pay | Admitting: Family Medicine

## 2019-06-05 ENCOUNTER — Other Ambulatory Visit: Payer: Self-pay

## 2019-06-05 VITALS — BP 122/68 | HR 85 | Temp 97.6°F | Ht 62.0 in | Wt 131.3 lb

## 2019-06-05 DIAGNOSIS — Z0000 Encounter for general adult medical examination without abnormal findings: Secondary | ICD-10-CM

## 2019-06-05 DIAGNOSIS — I4891 Unspecified atrial fibrillation: Secondary | ICD-10-CM | POA: Diagnosis not present

## 2019-06-05 DIAGNOSIS — Z7189 Other specified counseling: Secondary | ICD-10-CM

## 2019-06-05 DIAGNOSIS — Z23 Encounter for immunization: Secondary | ICD-10-CM | POA: Diagnosis not present

## 2019-06-05 DIAGNOSIS — M719 Bursopathy, unspecified: Secondary | ICD-10-CM | POA: Diagnosis not present

## 2019-06-05 DIAGNOSIS — I10 Essential (primary) hypertension: Secondary | ICD-10-CM | POA: Diagnosis not present

## 2019-06-05 NOTE — Patient Instructions (Addendum)
Call about a mammogram when you can.   Flu shot today.  Update me as needed.  Take care.  Glad to see you.

## 2019-06-05 NOTE — Progress Notes (Signed)
I have personally reviewed the Medicare Annual Wellness questionnaire and have noted 1. The patient's medical and social history 2. Their use of alcohol, tobacco or illicit drugs 3. Their current medications and supplements 4. The patient's functional ability including ADL's, fall risks, home safety risks and hearing or visual             impairment. 5. Diet and physical activities 6. Evidence for depression or mood disorders  The patients weight, height, BMI have been recorded in the chart and visual acuity is per eye clinic.  I have made referrals, counseling and provided education to the patient based review of the above and I have provided the pt with a written personalized care plan for preventive services.  Provider list updated- see scanned forms.  Routine anticipatory guidance given to patient.  See health maintenance. The possibility exists that previously documented standard health maintenance information may have been brought forward from a previous encounter into this note.  If needed, that same information has been updated to reflect the current situation based on today's encounter.    Flu 2020 Shingles discussed with patient. PNA out of stock Tetanus discussed with patient. Colon cancer screening not applicable due to age. Breast cancer screening discussed with patient. Bone density test declined. Advance directive d/w pt. Would want her husband, daughter Vaughan Basta, and her grandson Irine Seal all designated if patient were incapacitated. Cognitive function addressed- see scanned forms- and if abnormal then additional documentation follows.   Hypertension:    Using medication without problems or lightheadedness: occ with sitting up quickly, cautions d/w pt.   Chest pain with exertion:no Edema:no- resolved on lower dose of CCB.   Short of breath:no Average home BPs: similar to today on home check.   She is active and working on diet.   Anticoagulated for AF.  No  bleeding.  No bruising.  No heart racing.  No syncope.    She went to PT and that helped with bursitis.  She is clearly better in the meantime.  She has used voltaren gel prn with relief.    PMH and SH reviewed  Meds, vitals, and allergies reviewed.   ROS: Per HPI.  Unless specifically indicated otherwise in HPI, the patient denies:  General: fever. Eyes: acute vision changes ENT: sore throat Cardiovascular: chest pain Respiratory: SOB GI: vomiting GU: dysuria Musculoskeletal: acute back pain Derm: acute rash Neuro: acute motor dysfunction Psych: worsening mood Endocrine: polydipsia Heme: bleeding Allergy: hayfever  GEN: nad, alert and oriented HEENT: ncat NECK: supple w/o LA CV: rrr with occasional ectopy.  Not tachy PULM: ctab, no inc wob ABD: soft, +bs EXT: no edema SKIN: no acute rash  Health Maintenance  Topic Date Due  . DEXA SCAN  03/04/2026 (Originally 03/19/1995)  . PNA vac Low Risk Adult (1 of 2 - PCV13) 03/04/2026 (Originally 03/19/1995)  . TETANUS/TDAP  03/05/2026 (Originally [birth year]/1949)  . INFLUENZA VACCINE  Completed

## 2019-06-08 DIAGNOSIS — Z0000 Encounter for general adult medical examination without abnormal findings: Secondary | ICD-10-CM | POA: Insufficient documentation

## 2019-06-08 NOTE — Assessment & Plan Note (Signed)
No bleeding.  No bruising.  No heart racing.  No syncope.  Continue as is.

## 2019-06-08 NOTE — Assessment & Plan Note (Signed)
No change in meds.  Edema improved.  Continue as is.  Blood pressure control.

## 2019-06-08 NOTE — Assessment & Plan Note (Signed)
Advance directive d/w pt. Would want her husband, daughter Vaughan Basta, and her grandson Irine Seal all designated if patient were incapacitated.

## 2019-06-08 NOTE — Assessment & Plan Note (Signed)
Flu 2020 Shingles discussed with patient. PNA out of stock Tetanus discussed with patient. Colon cancer screening not applicable due to age. Breast cancer screening discussed with patient. Bone density test declined. Advance directive d/w pt. Would want her husband, daughter Ashlee Warren, and her grandson Ashlee Warren all designated if patient were incapacitated. Cognitive function addressed- see scanned forms- and if abnormal then additional documentation follows.

## 2019-06-08 NOTE — Assessment & Plan Note (Signed)
She went to PT and that helped with bursitis.  She is clearly better in the meantime.  She has used voltaren gel prn with relief.

## 2019-07-17 DIAGNOSIS — C44311 Basal cell carcinoma of skin of nose: Secondary | ICD-10-CM | POA: Diagnosis not present

## 2019-07-17 DIAGNOSIS — D225 Melanocytic nevi of trunk: Secondary | ICD-10-CM | POA: Diagnosis not present

## 2019-07-17 DIAGNOSIS — X32XXXD Exposure to sunlight, subsequent encounter: Secondary | ICD-10-CM | POA: Diagnosis not present

## 2019-07-17 DIAGNOSIS — L570 Actinic keratosis: Secondary | ICD-10-CM | POA: Diagnosis not present

## 2019-08-14 DIAGNOSIS — Z08 Encounter for follow-up examination after completed treatment for malignant neoplasm: Secondary | ICD-10-CM | POA: Diagnosis not present

## 2019-08-14 DIAGNOSIS — X32XXXD Exposure to sunlight, subsequent encounter: Secondary | ICD-10-CM | POA: Diagnosis not present

## 2019-08-14 DIAGNOSIS — Z85828 Personal history of other malignant neoplasm of skin: Secondary | ICD-10-CM | POA: Diagnosis not present

## 2019-08-14 DIAGNOSIS — L570 Actinic keratosis: Secondary | ICD-10-CM | POA: Diagnosis not present

## 2019-09-21 ENCOUNTER — Other Ambulatory Visit: Payer: Self-pay | Admitting: Cardiovascular Disease

## 2019-09-21 ENCOUNTER — Other Ambulatory Visit: Payer: Self-pay | Admitting: Family Medicine

## 2019-09-21 NOTE — Telephone Encounter (Signed)
Prescription refill request for Xarelto received.   Last office visit: 12/08/2018, Ashlee Warren  Weight: 59.6 kg Age: 85 y.o. Scr: 0.95, 04/07/2019 CrCl: 38 ml/min  Prescription refill sent.

## 2019-10-14 ENCOUNTER — Other Ambulatory Visit: Payer: Self-pay | Admitting: Family Medicine

## 2019-12-09 NOTE — Progress Notes (Signed)
  Patient ID: Ashlee Warren, female   DOB: Jun 24, 1928, 86 y.o.   MRN: [id number]     86 y.o. chronic afib on Xarelto with no bleeding issues. Had [facility] to NSR 03/11/12 Flecainide stopped due to QRS widening On ETT. No history of CAD. EF has been normal  CHADVASC 4 Significant anxiety ans somatization  Carotid plaque with no stenosis duplex 07/24/17  Has chronic bifasicular block RBBB/LAFB no high grade AV block or syncope  Xarelto dose decreased 15 mg   Married 72 years Husbands health deteriorating Has neuropathy and arhtritis Hard to get around They have 2 daughters in [place] and a son in [place]  She has had some pain in right hip from bursitis   Had vaccine lat week and will get 2nd one in 2 weeks   ROS: Denies fever, malais, weight loss, blurry vision, decreased visual acuity, cough, sputum, SOB, hemoptysis, pleuritic pain, palpitaitons, heartburn, abdominal pain, melena, lower extremity edema, claudication, or rash.  All other systems reviewed and negative  General: BP (!) 150/72   Pulse 87   Ht 5\' 2"  (1.575 m)   Wt 132 lb (59.9 kg)   SpO2 99%   BMI 24.14 kg/m  Affect appropriate Healthy:  appears stated age 54: normal Neck supple with no adenopathy JVP normal no bruits no thyromegaly Lungs clear with no wheezing and good diaphragmatic motion Heart:  S1/S2 no murmur, no rub, gallop or click PMI normal Abdomen: benighn, BS positve, no tenderness, no AAA no bruit.  No HSM or HJR Distal pulses intact with no bruits No edema Neuro non-focal Skin warm and dry No muscular weakness    Current Outpatient Medications  Medication Sig Dispense Refill  . diclofenac sodium (VOLTAREN) 1 % GEL Apply 2 g topically 4 (four) times daily. 100 g 3  . diltiazem (CARDIZEM CD) 180 MG 24 hr capsule Take 1 capsule (180 mg total) by mouth daily. 90 capsule 3  . fluticasone (FLONASE) 50 MCG/ACT nasal spray SPRAY 2 SPRAYS INTO EACH NOSTRIL EVERY DAY 16 mL 5  . Multiple  Vitamins-Minerals (PRESERVISION AREDS 2+MULTI VIT PO) Take by mouth.    [name] 15 MG TABS tablet TAKE 1 TABLET BY MOUTH EVERY DAY WITH SUPPER 90 tablet 1   No current facility-administered medications for this visit.    Allergies  Penicillins and Sulfonamide derivatives   Electrocardiogram: 06/05/18 AFib rate 92 RBBB/LAFB no changes 12/14/19 afib rate 87 RBBB LAFB   Assessment and Plan  PAF:  CHADVASC 4 on xarelto  no bleeding issues   HTN:  Well controlled.  Continue current medications and low sodium Dash type diet.   Carotid:  Some plaque no stenosis  Reviewed duplex from 07/24/17   RBBB/LAFB:  Yearly ECG no high grade heart block or syncope stable    Jenkins Rouge

## 2019-12-10 ENCOUNTER — Ambulatory Visit: Payer: Medicare Other | Attending: Internal Medicine

## 2019-12-10 DIAGNOSIS — Z23 Encounter for immunization: Secondary | ICD-10-CM | POA: Insufficient documentation

## 2019-12-10 NOTE — Progress Notes (Signed)
   Covid-19 Vaccination Clinic  Name:  Ashlee Warren    MRN: [id number] DOB: 02-Jun-1928  12/10/2019  [name] was observed post Covid-19 immunization for 15 minutes without incidence. She was provided with Vaccine Information Sheet and instruction to access the V-Safe system.   [name] was instructed to call 911 with any severe reactions post vaccine: Marland Kitchen Difficulty breathing  . Swelling of your face and throat  . A fast heartbeat  . A bad rash all over your body  . Dizziness and weakness    Immunizations Administered    Name Date Dose VIS Date Route   Pfizer COVID-19 Vaccine 12/10/2019  1:34 PM 0.3 mL 09/25/2019 Intramuscular   Manufacturer: [place]   Lot: [id number]   [place]: [id number]

## 2019-12-14 ENCOUNTER — Other Ambulatory Visit: Payer: Self-pay

## 2019-12-14 ENCOUNTER — Other Ambulatory Visit: Payer: Self-pay | Admitting: Cardiovascular Disease

## 2019-12-14 ENCOUNTER — Ambulatory Visit (INDEPENDENT_AMBULATORY_CARE_PROVIDER_SITE_OTHER): Payer: Medicare Other | Admitting: Cardiovascular Disease

## 2019-12-14 ENCOUNTER — Encounter: Payer: Self-pay | Admitting: Cardiovascular Disease

## 2019-12-14 VITALS — BP 150/72 | HR 87 | Ht 62.0 in | Wt 132.0 lb

## 2019-12-14 DIAGNOSIS — I480 Paroxysmal atrial fibrillation: Secondary | ICD-10-CM

## 2019-12-14 NOTE — Telephone Encounter (Signed)
Pt last saw Dr Johnsie Cancel 12/08/18, overdue for follow-up, pt is seeing Dr Johnsie Cancel today 12/14/19.  Last labs 04/07/19 Creat 0.95, age 86, weight 59.6kg, CrCl 37.77, based on CrCl pt is on appropriate dosage of Xarelto 15mg  QD.  Will refill rx.

## 2019-12-14 NOTE — Patient Instructions (Addendum)
Medication Instructions:  *If you need a refill on your cardiac medications before your next appointment, please call your pharmacy*   Lab Work: If you have labs (blood work) drawn today and your tests are completely normal, you will receive your results only by: Marland Kitchen MyChart Message (if you have MyChart) OR . A paper copy in the mail If you have any lab test that is abnormal or we need to change your treatment, we will call you to review the results.  Testing/Procedures: None ordered today.  Follow-Up: At [facility], you and your health needs are our priority.  As part of our continuing mission to provide you with exceptional heart care, we have created designated Provider Care Teams.  These Care Teams include your primary Cardiologist (physician) and Advanced Practice Providers (APPs -  Physician Assistants and Nurse Practitioners) who all work together to provide you with the care you need, when you need it.  We recommend signing up for the patient portal called "MyChart".  Sign up information is provided on this After Visit Summary.  MyChart is used to connect with patients for Virtual Visits (Telemedicine).  Patients are able to view lab/test results, encounter notes, upcoming appointments, etc.  Non-urgent messages can be sent to your provider as well.   To learn more about what you can do with MyChart, go to [email or web address].    Your next appointment:   6 month(s)  The format for your next appointment:   Virtual visit  Provider:   You may see Jenkins Rouge, MD or one of the following Advanced Practice Providers on your designated Care Team:    Truitt Merle, NP  Cecilie Kicks, NP  Kathyrn Drown, NP

## 2019-12-14 NOTE — Telephone Encounter (Signed)
Prescription refill request for Xarelto received.   Last office visit: Ashlee Warren, 12/08/2018,  Weight: 59.6kg Age: 86 y.o. Scr: 0.95, 04/07/2019 CrCl: 38 ml/min   Prescription refill sent. Office visit scheduled today with Ashlee Warren.

## 2020-01-05 ENCOUNTER — Ambulatory Visit: Payer: Medicare Other | Attending: Internal Medicine

## 2020-01-05 DIAGNOSIS — Z23 Encounter for immunization: Secondary | ICD-10-CM

## 2020-01-05 NOTE — Progress Notes (Signed)
   Covid-19 Vaccination Clinic  Name:  Ashlee Warren    MRN: [id number] DOB: September 30, 1928  01/05/2020  [name] was observed post Covid-19 immunization for 15 minutes without incident. She was provided with Vaccine Information Sheet and instruction to access the V-Safe system.   [name] was instructed to call 911 with any severe reactions post vaccine: Marland Kitchen Difficulty breathing  . Swelling of face and throat  . A fast heartbeat  . A bad rash all over body  . Dizziness and weakness   Immunizations Administered    Name Date Dose VIS Date Route   Pfizer COVID-19 Vaccine 01/05/2020 10:10 AM 0.3 mL 09/25/2019 Intramuscular   Manufacturer: [place]   Lot: [id number]   [place]: [id number]

## 2020-02-12 DIAGNOSIS — Z08 Encounter for follow-up examination after completed treatment for malignant neoplasm: Secondary | ICD-10-CM | POA: Diagnosis not present

## 2020-02-12 DIAGNOSIS — Z85828 Personal history of other malignant neoplasm of skin: Secondary | ICD-10-CM | POA: Diagnosis not present

## 2020-03-13 ENCOUNTER — Other Ambulatory Visit: Payer: Self-pay | Admitting: Family Medicine

## 2020-03-13 DIAGNOSIS — I4891 Unspecified atrial fibrillation: Secondary | ICD-10-CM

## 2020-06-12 ENCOUNTER — Other Ambulatory Visit: Payer: Self-pay | Admitting: Family Medicine

## 2020-06-12 DIAGNOSIS — I4891 Unspecified atrial fibrillation: Secondary | ICD-10-CM

## 2020-06-15 ENCOUNTER — Other Ambulatory Visit: Payer: Self-pay | Admitting: Cardiovascular Disease

## 2020-06-15 NOTE — Telephone Encounter (Addendum)
Prescription refill request for Xarelto received.   Last office visit: Ashlee Warren. 12/14/2019 Weight: 59.9kg Age: 86 y.o. Scr: 0.95, 04/07/2019 CrCl:  51ml/min    Pt overdue for blood work, pt has an appointment to see [name] on 07/15/2020. Put on appointment note for pt to have CBC and Bmet for Xarelto follow up. Prescription refill sent.

## 2020-06-21 ENCOUNTER — Telehealth: Payer: Self-pay

## 2020-06-21 NOTE — Telephone Encounter (Signed)
Pt left v/m ;that this morning urine was normal yellow color; pt went to urinate again and looks red in commode water like blood in urine.  I spoke with pt; pt said for about 1 month pt has had rt side pain on and off along with blood in urine coming and going and a lump pt can feel on and off in rt side. Today pt cannot feel the lump but does appear to have blood in urine (color appears brownish). Pt said in the past the dark urine will last 3 or 4 times and then the urine goes back to the normal yellow color. Pt has voided x 2 this morning and the first time urine was normal color and the second time pt thinks has blood in urine. No abd pain, no N&V, no diarrhea, no burning or pain upon urination; no frequency or urgency of urine; pt is trying to drink water but is not drinking too much water per pt. Pt prefers to see [name].No available appts at [facility] today. Pt scheduled in office appt with [name] on 06/23/20 at 2 PM. If pt condition changes or worsens prior to appt pt will go to UC. Pt had Pfizer covid vaccines on 02/25/212 & 01/05/20. FYI to [name]. UC & ED precautions given and pt voiced understanding.

## 2020-06-22 NOTE — Telephone Encounter (Signed)
Late entry.  Agree.  Thanks.

## 2020-06-23 ENCOUNTER — Ambulatory Visit (INDEPENDENT_AMBULATORY_CARE_PROVIDER_SITE_OTHER): Payer: Medicare Other | Admitting: Family Medicine

## 2020-06-23 ENCOUNTER — Encounter: Payer: Self-pay | Admitting: Family Medicine

## 2020-06-23 ENCOUNTER — Other Ambulatory Visit: Payer: Self-pay

## 2020-06-23 VITALS — BP 148/92 | HR 91 | Temp 96.1°F | Ht 62.0 in | Wt 121.4 lb

## 2020-06-23 DIAGNOSIS — R319 Hematuria, unspecified: Secondary | ICD-10-CM | POA: Diagnosis not present

## 2020-06-23 LAB — POC URINALSYSI DIPSTICK (AUTOMATED)
Bilirubin, UA: NEGATIVE
Glucose, UA: NEGATIVE
Ketones, UA: NEGATIVE
Leukocytes, UA: NEGATIVE
Nitrite, UA: NEGATIVE
Protein, UA: NEGATIVE
Spec Grav, UA: 1.025 (ref 1.010–1.025)
Urobilinogen, UA: 0.2 E.U./dL
pH, UA: 6 (ref 5.0–8.0)

## 2020-06-23 NOTE — Progress Notes (Signed)
This visit occurred during the SARS-CoV-2 public health emergency.  Safety protocols were in place, including screening questions prior to the visit, additional usage of staff PPE, and extensive cleaning of exam room while observing appropriate contact time as indicated for disinfecting solutions.  She is trying to adjust to her husband's death.  D/w pt.    RLQ "twinges" noted more after working at home.  Heat and laying down helped.  Then she noted urinary color changes.  No pain urinating.  Then had some blood in urine episodically.  The she noted RLQ round mass, noted episodically.  Sometimes she saw red urine, gross hematuria.    She brought in nonsterile container of urine that is dark and has sediment.    No FCNAVD.    Meds, vitals, and allergies reviewed.   ROS: Per HPI unless specifically indicated in ROS section   nad ncat Neck supple, no LA rrr ctab abd soft, not ttp. No RLQ mass noted time of exam by either me or patient.  No rebound. Skin well perfused.

## 2020-06-23 NOTE — Patient Instructions (Signed)
Go to the lab on the way out.   If you have mychart we'll likely use that to update you.    Don't change your meds for now.   It likely that we'll need to see about getting a CT scan set up and have you see urology.   Take care.  Glad to see you.

## 2020-06-24 ENCOUNTER — Other Ambulatory Visit: Payer: Self-pay | Admitting: Family Medicine

## 2020-06-24 ENCOUNTER — Encounter: Payer: Self-pay | Admitting: Family Medicine

## 2020-06-24 DIAGNOSIS — R319 Hematuria, unspecified: Secondary | ICD-10-CM

## 2020-06-24 LAB — COMPREHENSIVE METABOLIC PANEL
ALT: 18 U/L (ref 0–35)
AST: 19 U/L (ref 0–37)
Albumin: 4.2 g/dL (ref 3.5–5.2)
Alkaline Phosphatase: 102 U/L (ref 39–117)
BUN: 24 mg/dL — ABNORMAL HIGH (ref 6–23)
CO2: 30 mEq/L (ref 19–32)
Calcium: 9.6 mg/dL (ref 8.4–10.5)
Chloride: 103 mEq/L (ref 96–112)
Creatinine, Ser: 1.14 mg/dL (ref 0.40–1.20)
GFR: 44.75 mL/min — ABNORMAL LOW (ref >60.00)
Glucose, Bld: 87 mg/dL (ref 70–99)
Potassium: 3.9 mEq/L (ref 3.5–5.1)
Sodium: 142 mEq/L (ref 135–145)
Total Bilirubin: 0.8 mg/dL (ref 0.2–1.2)
Total Protein: 7.1 g/dL (ref 6.0–8.3)

## 2020-06-24 LAB — CBC WITH DIFFERENTIAL/PLATELET
Basophils Absolute: 0.1 10*3/uL (ref 0.0–0.1)
Basophils Relative: 1.1 % (ref 0.0–3.0)
Eosinophils Absolute: 0.1 10*3/uL (ref 0.0–0.7)
Eosinophils Relative: 1.3 % (ref 0.0–5.0)
HCT: 43.1 % (ref 36.0–46.0)
Hemoglobin: 14.2 g/dL (ref 12.0–15.0)
Lymphocytes Relative: 25 % (ref 12.0–46.0)
Lymphs Abs: 2.5 10*3/uL (ref 0.7–4.0)
MCHC: 32.8 g/dL (ref 30.0–36.0)
MCV: 89.6 fl (ref 78.0–100.0)
Monocytes Absolute: 0.9 10*3/uL (ref 0.1–1.0)
Monocytes Relative: 9.5 % (ref 3.0–12.0)
Neutro Abs: 6.3 10*3/uL (ref 1.4–7.7)
Neutrophils Relative %: 63.1 % (ref 43.0–77.0)
Platelets: 311 10*3/uL (ref 150.0–400.0)
RBC: 4.81 Mil/uL (ref 3.87–5.11)
RDW: 13.7 % (ref 11.5–15.5)
WBC: 9.9 10*3/uL (ref 4.0–10.5)

## 2020-06-24 LAB — URINE CULTURE
MICRO NUMBER:: 10929789
SPECIMEN QUALITY:: ADEQUATE

## 2020-06-28 ENCOUNTER — Other Ambulatory Visit: Payer: Self-pay | Admitting: Family Medicine

## 2020-06-28 DIAGNOSIS — R319 Hematuria, unspecified: Secondary | ICD-10-CM

## 2020-06-29 DIAGNOSIS — R319 Hematuria, unspecified: Secondary | ICD-10-CM | POA: Insufficient documentation

## 2020-06-29 NOTE — Assessment & Plan Note (Signed)
Discussed options.  Reasonable to get labs done today.  See notes on labs.  We talked about if she has documented hematuria then it would be reasonable to get CT abdomen and likely see urology.  See labs and orders.

## 2020-07-01 ENCOUNTER — Encounter: Payer: Self-pay | Admitting: Family Medicine

## 2020-07-01 ENCOUNTER — Telehealth: Payer: Self-pay

## 2020-07-01 DIAGNOSIS — N200 Calculus of kidney: Secondary | ICD-10-CM | POA: Diagnosis not present

## 2020-07-01 DIAGNOSIS — N201 Calculus of ureter: Secondary | ICD-10-CM | POA: Diagnosis not present

## 2020-07-01 DIAGNOSIS — R310 Gross hematuria: Secondary | ICD-10-CM | POA: Diagnosis not present

## 2020-07-01 DIAGNOSIS — N281 Cyst of kidney, acquired: Secondary | ICD-10-CM | POA: Diagnosis not present

## 2020-07-01 DIAGNOSIS — K449 Diaphragmatic hernia without obstruction or gangrene: Secondary | ICD-10-CM | POA: Diagnosis not present

## 2020-07-01 MED ORDER — HYDROCODONE-ACETAMINOPHEN 5-325 MG PO TABS: 0.5000 | ORAL_TABLET | Freq: Four times a day (QID) | ORAL | 0 refills | Status: DC | PRN

## 2020-07-01 NOTE — Progress Notes (Signed)
  Patient ID: Ashlee Warren, female   DOB: [birth year], 86 y.o.   MRN: [id number]     86 y.o. chronic afib on Xarelto with no bleeding issues. Had [facility] to NSR 03/11/12 Flecainide stopped due to QRS widening On ETT. No history of CAD. EF has been normal  CHADVASC 4 Significant anxiety ans somatization  Carotid plaque with no stenosis duplex 07/24/17  Has chronic bifasicular block RBBB/LAFB no high grade AV block or syncope  Xarelto dose decreased 15 mg   Married 57 years Husband passed recently ans she is trying to cope  They have 2 daughters in [place] and a son in [place]  She has had some pain in right hip from bursitis  Also had had some hematuria  Had COVID vaccine in March  Had a nice long discussion of her husband Ashlee Warren and their 17 year marriage Daughter with her today   ROS: Denies fever, malais, weight loss, blurry vision, decreased visual acuity, cough, sputum, SOB, hemoptysis, pleuritic pain, palpitaitons, heartburn, abdominal pain, melena, lower extremity edema, claudication, or rash.  All other systems reviewed and negative  General: BP 136/68   Pulse 90   Ht 5\' 2"  (1.575 m)   Wt 124 lb (56.2 kg)   SpO2 96%   BMI 22.68 kg/m  Affect appropriate Healthy:  appears stated age 86: normal Neck supple with no adenopathy JVP normal no bruits no thyromegaly Lungs clear with no wheezing and good diaphragmatic motion Heart:  S1/S2 no murmur, no rub, gallop or click PMI normal Abdomen: benighn, BS positve, no tenderness, no AAA no bruit.  No HSM or HJR Distal pulses intact with no bruits No edema Neuro non-focal Skin warm and dry No muscular weakness   Current Outpatient Medications  Medication Sig Dispense Refill  . diclofenac sodium (VOLTAREN) 1 % GEL Apply 2 g topically 4 (four) times daily. 100 g 3  . diltiazem (CARDIZEM CD) 180 MG 24 hr capsule Take 1 capsule (180 mg total) by mouth daily. Please make an appointment for physical exam prior to further  refills. 90 capsule 0  . fluticasone (FLONASE) 50 MCG/ACT nasal spray SPRAY 2 SPRAYS INTO EACH NOSTRIL EVERY DAY 16 mL 5  . HYDROcodone-acetaminophen (NORCO/VICODIN) 5-325 MG tablet Take 0.5-1 tablets by mouth every 6 (six) hours as needed for moderate pain (for kidney stone.  sedation caution). 20 tablet 0  . Multiple Vitamins-Minerals (PRESERVISION AREDS 2+MULTI VIT PO) Take by mouth.    Ashlee Warren 15 MG TABS tablet TAKE 1 TABLET BY MOUTH EVERY DAY WITH SUPPER *$335.00 90 tablet 0   No current facility-administered medications for this visit.    Allergies  Penicillins, Sulfonamide derivatives, and Tamsulosin   Electrocardiogram: 06/05/18 AFib rate 92 RBBB/LAFB no changes 12/14/19 afib rate 87 RBBB LAFB   Assessment and Plan  PAF:  CHADVASC 4 on xarelto  no bleeding issues   HTN:  Well controlled.  Continue current medications and low sodium Dash type diet.   Carotid:  Some plaque no stenosis  Reviewed duplex from 07/24/17   RBBB/LAFB:  Yearly ECG no high grade heart block or syncope stable  Depression: reactive to husbands death f/u Dr Ashlee Warren  Urology:  F/u next week has 5 mm right kidney stone She knows to hold xarelto for any hematuria Ashlee Warren

## 2020-07-01 NOTE — Telephone Encounter (Signed)
Patient states her pain is intermittent on the level from 1 to 5 and it is relieved quickly with sitting or lying down and using the heating pad

## 2020-07-01 NOTE — Telephone Encounter (Signed)
Please update patient.  Awaiting faxed report.  How much pain is she having?  What symptoms at this point?  Please let me know.  Thanks.

## 2020-07-01 NOTE — Telephone Encounter (Signed)
[facility]: 55mm partially obstructing stone in distal right ureter. Other stones bilaterally not obstructing. Report to be faxed.

## 2020-07-01 NOTE — Telephone Encounter (Signed)
Daughter advised.

## 2020-07-01 NOTE — Telephone Encounter (Signed)
I would drink plenty of fluids.  I would expect a 9mm distal stone to pass.   I would not use flomax given her sulfa allergy.   Would continue as is o/w. If she has sig pain, then use hydrocodone with sedation caution.  I sent rx.  If not needed, then don't use.  Please have her update [place] Monday.  Thanks.

## 2020-07-03 ENCOUNTER — Telehealth: Payer: Self-pay | Admitting: Family Medicine

## 2020-07-03 ENCOUNTER — Encounter: Payer: Self-pay | Admitting: Family Medicine

## 2020-07-03 DIAGNOSIS — I2510 Atherosclerotic heart disease of native coronary artery without angina pectoris: Secondary | ICD-10-CM | POA: Insufficient documentation

## 2020-07-03 DIAGNOSIS — I700 Atherosclerosis of aorta: Secondary | ICD-10-CM | POA: Insufficient documentation

## 2020-07-03 NOTE — Telephone Encounter (Signed)
Please make sure her CT abdomen pelvis report printed.  I want to get that scanned in the chart.  If it did not print then please let me know.  If she is unable to pass the stone then let me know.  There is a small right inguinal hernia containing only fat,  likely accounting for the palpable concern.  This is reassuring.  No intervention needed unless she is having pain from it.  She also has small renal cysts that do not need intervention.  She has some fibrotic changes at both lung bases that do not need intervention as long she is not having shortness of breath.    She has incidental aortic and coronary atherosclerosis noted.  If she is interested in a statin trial then let me know.  Thanks.

## 2020-07-04 ENCOUNTER — Encounter: Payer: Self-pay | Admitting: Family Medicine

## 2020-07-04 NOTE — Telephone Encounter (Signed)
Patient advised. Ashlee Warren was available to hear the results as well and questioned an appt with Urology.  They have not received a call yet to get an appt with Urology and he is concerned about the kidney stone and waiting any longer for it to pass.  Copy of CT mailed to patient at grandson's request.

## 2020-07-05 NOTE — Telephone Encounter (Signed)
What is the status on her referral to urology?  Please let patient know.  Thanks.

## 2020-07-06 DIAGNOSIS — N202 Calculus of kidney with calculus of ureter: Secondary | ICD-10-CM | POA: Diagnosis not present

## 2020-07-06 DIAGNOSIS — R1084 Generalized abdominal pain: Secondary | ICD-10-CM | POA: Diagnosis not present

## 2020-07-06 NOTE — Telephone Encounter (Signed)
Her referral was sent over to [facility] in [place] [facility]) about 1 week ago.  I do not see where this was scheduled on yet. I will follow up.

## 2020-07-06 NOTE — Telephone Encounter (Signed)
Spoke with triage nurse Ashlee Warren at [facility] to get patient scheduled d/t Kidney stone and hematuria.  Call report 07/01/20 1:21 PM Note [facility]: 78mm partially obstructing stone in distal right ureter. Other stones bilaterally not obstructing. Report to be faxed.     Ashlee Warren was able to get the patient worked in today 07/06/20 at 2:30 (2:15 arrival) Patient is aware as well as Daughter Ashlee Warren - they are also taking CT results with them to this appt.  [facility] is aware that records have been sent over last week via [employer].

## 2020-07-06 NOTE — Telephone Encounter (Signed)
Thanks

## 2020-07-15 ENCOUNTER — Encounter: Payer: Self-pay | Admitting: Cardiovascular Disease

## 2020-07-15 ENCOUNTER — Other Ambulatory Visit: Payer: Self-pay

## 2020-07-15 ENCOUNTER — Ambulatory Visit (INDEPENDENT_AMBULATORY_CARE_PROVIDER_SITE_OTHER): Payer: Medicare Other | Admitting: Cardiovascular Disease

## 2020-07-15 VITALS — BP 136/68 | HR 90 | Ht 62.0 in | Wt 124.0 lb

## 2020-07-15 DIAGNOSIS — I482 Chronic atrial fibrillation, unspecified: Secondary | ICD-10-CM

## 2020-07-15 NOTE — Patient Instructions (Addendum)

## 2020-08-03 DIAGNOSIS — R1084 Generalized abdominal pain: Secondary | ICD-10-CM | POA: Diagnosis not present

## 2020-08-03 DIAGNOSIS — N202 Calculus of kidney with calculus of ureter: Secondary | ICD-10-CM | POA: Diagnosis not present

## 2020-08-09 ENCOUNTER — Telehealth: Payer: Self-pay | Admitting: Cardiovascular Disease

## 2020-08-09 NOTE — Telephone Encounter (Signed)
Pharmacy, can you please comment on how long patient can hold Xarelto for upcoming procedure?  Thank you! 

## 2020-08-09 NOTE — Telephone Encounter (Signed)
   [facility] Pre-operative Risk Assessment    HEARTCARE STAFF: - Please ensure there is not already an duplicate clearance open for this procedure. - Under Visit Info/Reason for Call, type in Other and utilize the format Clearance MM/DD/YY or Clearance TBD. Do not use dashes or single digits. - If request is for dental extraction, please clarify the # of teeth to be extracted.  Request for surgical clearance:  1. What type of surgery is being performed? Cystoscopy ureteroscopy lithotripsy  2. When is this surgery scheduled? TBD  3. What type of clearance is required (medical clearance vs. Pharmacy clearance to hold med vs. Both)? BOTH  4. Are there any medications that need to be held prior to surgery and how long? Xerlto held 48-72 hrs prior  5. Practice name and name of physician performing surgery? Dr. Salvatore Decent   6. What is the office phone number? [phone]   7.   What is the office fax number? [phone]  8.   Anesthesia type (None, local, MAC, general) ? General    Ashlee Warren 08/09/2020, 3:58 PM  _________________________________________________________________   (provider comments below)

## 2020-08-09 NOTE — Telephone Encounter (Signed)
Patient with diagnosis of afib on Xarelto for anticoagulation.    Procedure: cystoscopy ureteroscopy lithotripsy Date of procedure: TBD  CHA2DS2-VASc Score = 5  This indicates a 7.2% annual risk of stroke. The patient's score is based upon: CHF History: 0 HTN History: 1 Diabetes History: 0 Stroke History: 0 Vascular Disease History: 1 (noted 07/01/20 scan) Age Score: 2 Gender Score: 1  CrCl 24mL/min - pt on appropriately reduced dose of Xarelto 15mg  daily Platelet count 311K  Per office protocol, patient can hold Xarelto for 2-3 days prior to procedure as requested.

## 2020-08-10 NOTE — Telephone Encounter (Signed)
   Primary Cardiologist: Jenkins Rouge, MD  Chart reviewed as part of pre-operative protocol coverage. Patient was recently seen by [name] on 07/15/2020 and was doing well from a cardiac standpoint at that time. Patient contacted today for further pre-op evaluation. She has done well since last office visit. She notes occasionally feeling of heart racing if she is being more active or there is "a lot going on" but no other palpitations. No chest pain, shortness of breath, orthopnea, dizziness (other than mild dizziness with position changes), or  syncope. Able to complete >4.0 METS without any anginal symptoms. Given past medical history and time since last visit, based on ACC/AHA guidelines, Marcele Kosta would be at acceptable risk for the planned procedure without further cardiovascular testing.   Per Pharmacy and office protocol, patient can hold Xarelto for 2-3 days prior to procedure. This should be restarted as soon as able postoperatively.  Procedure has not officially been scheduled yet. Per patient, she is having a repeat CT scan first. The patient was advised that if she develops new symptoms prior to surgery to contact our office to arrange for a follow-up visit, and she verbalized understanding.  I will route this recommendation to the requesting party via Epic fax function and remove from pre-op pool.  Please call with questions.  Darreld Mclean, PA-C 08/10/2020, 8:41 AM

## 2020-08-16 DIAGNOSIS — K4090 Unilateral inguinal hernia, without obstruction or gangrene, not specified as recurrent: Secondary | ICD-10-CM | POA: Diagnosis not present

## 2020-08-16 DIAGNOSIS — N132 Hydronephrosis with renal and ureteral calculous obstruction: Secondary | ICD-10-CM | POA: Diagnosis not present

## 2020-08-16 DIAGNOSIS — Z87442 Personal history of urinary calculi: Secondary | ICD-10-CM | POA: Diagnosis not present

## 2020-08-16 DIAGNOSIS — N202 Calculus of kidney with calculus of ureter: Secondary | ICD-10-CM | POA: Diagnosis not present

## 2020-08-16 DIAGNOSIS — K5730 Diverticulosis of large intestine without perforation or abscess without bleeding: Secondary | ICD-10-CM | POA: Diagnosis not present

## 2020-08-23 ENCOUNTER — Other Ambulatory Visit: Payer: Self-pay | Admitting: Urology

## 2020-08-24 ENCOUNTER — Other Ambulatory Visit: Payer: Self-pay | Admitting: Urology

## 2020-08-30 ENCOUNTER — Other Ambulatory Visit: Payer: Self-pay | Admitting: Urology

## 2020-09-01 DIAGNOSIS — Z23 Encounter for immunization: Secondary | ICD-10-CM | POA: Diagnosis not present

## 2020-09-05 ENCOUNTER — Other Ambulatory Visit (HOSPITAL_COMMUNITY): Payer: Medicare Other

## 2020-09-05 NOTE — Patient Instructions (Signed)
DUE TO COVID-19 ONLY ONE VISITOR IS ALLOWED TO COME WITH YOU AND STAY IN THE WAITING ROOM ONLY DURING PRE OP AND PROCEDURE DAY OF SURGERY. THE 1 VISITOR  MAY VISIT WITH YOU AFTER SURGERY IN YOUR PRIVATE ROOM DURING VISITING HOURS ONLY!  YOU NEED TO HAVE A COVID 19 TEST ON__11-30-21_____ @_______ , THIS TEST MUST BE DONE BEFORE SURGERY,  COVID TESTING SITE [address] [place] [address], IT IS ON THE RIGHT GOING OUT [address] APPROXIMATELY  2 MINUTES PAST [employer] ON THE RIGHT. ONCE YOUR COVID TEST IS COMPLETED,  PLEASE BEGIN THE QUARANTINE INSTRUCTIONS AS OUTLINED IN YOUR HANDOUT.                Ashlee Warren  09/05/2020   Your procedure is scheduled on: 09-16-20   Report to [facility] Main  Entrance   Report to admitting at        1130 AM     Call this number if you have problems the morning of surgery [phone]    Remember: Do not eat food  :After Midnight. You may have clear liquids until 1030 am then nothing by mouth     CLEAR LIQUID DIET   Foods Allowed                                                                                             Foods Excluded  Black Coffee and tea, regular and decaf                                           liquids that you cannot  Plain Jell-O any favor except red or purple                                           see through such as: Fruit ices (not with fruit pulp)                                                        milk, soups, orange juice  Iced Popsicles                                                     All solid food Carbonated beverages, regular and diet                                    Cranberry, grape and apple juices Sports drinks like Gatorade Lightly seasoned clear broth or consume(fat free) Sugar, honey syrup _____________________________________________________________________  BRUSH YOUR TEETH MORNING OF SURGERY AND RINSE YOUR MOUTH OUT, NO CHEWING GUM CANDY OR  MINTS.     Take these medicines the morning of surgery with A SIP OF WATER: diltiazem                                 You may not have any metal on your body including hair pins and              piercings  Do not wear jewelry, make-up, lotions, powders or perfumes, deodorant             Do not wear nail polish on your fingernails.  Do not shave  48 hours prior to surgery.     Do not bring valuables to the hospital. [place].  Contacts, dentures or bridgework may not be worn into surgery.      Patients discharged the day of surgery will not be allowed to drive home. IF YOU ARE HAVING SURGERY AND GOING HOME THE SAME DAY, YOU MUST HAVE AN ADULT TO DRIVE YOU HOME AND BE WITH YOU FOR 24 HOURS. YOU MAY GO HOME BY TAXI OR UBER OR ORTHERWISE, BUT AN ADULT MUST ACCOMPANY YOU HOME AND STAY WITH YOU FOR 24 HOURS.  Name and phone number of your driver:  Special Instructions: N/A              Please read over the following fact sheets you were given: _____________________________________________________________________             [facility] - Preparing for Surgery Before surgery, you can play an important role.  Because skin is not sterile, your skin needs to be as free of germs as possible.  You can reduce the number of germs on your skin by washing with CHG (chlorahexidine gluconate) soap before surgery.  CHG is an antiseptic cleaner which kills germs and bonds with the skin to continue killing germs even after washing. Please DO NOT use if you have an allergy to CHG or antibacterial soaps.  If your skin becomes reddened/irritated stop using the CHG and inform your nurse when you arrive at Short Stay. Do not shave (including legs and underarms) for at least 48 hours prior to the first CHG shower.  You may shave your face/neck. Please follow these instructions carefully:  1.  Shower with CHG Soap the night before surgery and the  morning of  Surgery.  2.  If you choose to wash your hair, wash your hair first as usual with your  normal  shampoo.  3.  After you shampoo, rinse your hair and body thoroughly to remove the  shampoo.                           4.  Use CHG as you would any other liquid soap.  You can apply chg directly  to the skin and wash                       Gently with a scrungie or clean washcloth.  5.  Apply the CHG Soap to your body ONLY FROM THE NECK DOWN.   Do not use on face/ open  Wound or open sores. Avoid contact with eyes, ears mouth and genitals (private parts).                       Wash face,  Genitals (private parts) with your normal soap.             6.  Wash thoroughly, paying special attention to the area where your surgery  will be performed.  7.  Thoroughly rinse your body with warm water from the neck down.  8.  DO NOT shower/wash with your normal soap after using and rinsing off  the CHG Soap.                9.  Pat yourself dry with a clean towel.            10.  Wear clean pajamas.            11.  Place clean sheets on your bed the night of your first shower and do not  sleep with pets. Day of Surgery : Do not apply any lotions/deodorants the morning of surgery.  Please wear clean clothes to the hospital/surgery center.  FAILURE TO FOLLOW THESE INSTRUCTIONS MAY RESULT IN THE CANCELLATION OF YOUR SURGERY PATIENT SIGNATURE_________________________________  NURSE SIGNATURE__________________________________  ________________________________________________________________________

## 2020-09-05 NOTE — Progress Notes (Addendum)
PCP - Elsie Stain, MD Cardiologist - Jenkins Rouge 08-10-20 clearance epic  lov 07-15-20 epic  PPM/ICD -  Device Orders -  Rep Notified -   Chest x-ray -  EKG - 12-14-19 epic Stress Test -  ECHO -  Cardiac Cath -   Sleep Study -  CPAP -   Fasting Blood Sugar -  Checks Blood Sugar _____ times a day  Blood Thinner Instructions:Xarelto hold 2-3 days last dose 11-29 Aspirin Instructions:  ERAS Protcol - PRE-SURGERY Ensure or G2-   COVID TEST- 11-30  ACTIVITY--does own housework without SOB, walks to mailbox without sob Anesthesia review: A fib in blood thinner  Patient denies shortness of breath, fever, cough and chest pain at PAT appointment   All instructions explained to the patient, with a verbal understanding of the material. Patient agrees to go over the instructions while at home for a better understanding. Patient also instructed to self quarantine after being tested for COVID-19. The opportunity to ask questions was provided.

## 2020-09-06 ENCOUNTER — Other Ambulatory Visit: Payer: Self-pay

## 2020-09-06 ENCOUNTER — Encounter (HOSPITAL_COMMUNITY): Payer: Self-pay

## 2020-09-06 ENCOUNTER — Encounter (HOSPITAL_COMMUNITY)
Admission: RE | Admit: 2020-09-06 | Discharge: 2020-09-06 | Disposition: A | Discharge status: Home / Self Care | Payer: Medicare Other | Source: Ambulatory Visit | Attending: Urology | Admitting: Urology

## 2020-09-06 DIAGNOSIS — Z01812 Encounter for preprocedural laboratory examination: Secondary | ICD-10-CM | POA: Insufficient documentation

## 2020-09-06 LAB — CBC
HCT: 44.5 % (ref 36.0–46.0)
Hemoglobin: 14 g/dL (ref 12.0–15.0)
MCH: 28.6 pg (ref 26.0–34.0)
MCHC: 31.5 g/dL (ref 30.0–36.0)
MCV: 91 fL (ref 80.0–100.0)
Platelets: 324 K/uL (ref 150–400)
RBC: 4.89 MIL/uL (ref 3.87–5.11)
RDW: 13.2 % (ref 11.5–15.5)
WBC: 8.5 K/uL (ref 4.0–10.5)
nRBC: 0 % (ref 0.0–0.2)

## 2020-09-06 LAB — BASIC METABOLIC PANEL WITH GFR
Anion gap: 8 (ref 5–15)
BUN: 18 mg/dL (ref 8–23)
CO2: 30 mmol/L (ref 22–32)
Calcium: 9.4 mg/dL (ref 8.9–10.3)
Chloride: 106 mmol/L (ref 98–111)
Creatinine, Ser: 1.02 mg/dL — ABNORMAL HIGH (ref 0.44–1.00)
GFR, Estimated: 52 mL/min — ABNORMAL LOW
Glucose, Bld: 87 mg/dL (ref 70–99)
Potassium: 3.7 mmol/L (ref 3.5–5.1)
Sodium: 144 mmol/L (ref 135–145)

## 2020-09-07 NOTE — Anesthesia Preprocedure Evaluation (Addendum)
Anesthesia Evaluation  Patient identified by MRN, date of birth, ID band Patient awake    Reviewed: Allergy & Precautions, NPO status , Patient's Chart, lab work & pertinent test results  Airway Mallampati: II  TM Distance: >3 FB Neck ROM: Full    Dental  (+) Teeth Intact, Dental Advisory Given   Pulmonary    breath sounds clear to auscultation       Cardiovascular hypertension,  Rhythm:Irregular Rate:Normal     Neuro/Psych    GI/Hepatic   Endo/Other    Renal/GU      Musculoskeletal   Abdominal   Peds  Hematology   Anesthesia Other Findings   Reproductive/Obstetrics                            Anesthesia Physical Anesthesia Plan  ASA: III  Anesthesia Plan: General   Post-op Pain Management:    Induction: Intravenous  PONV Risk Score and Plan: Ondansetron and Dexamethasone  Airway Management Planned: LMA  Additional Equipment:   Intra-op Plan:   Post-operative Plan: Extubation in OR  Informed Consent: I have reviewed the patients History and Physical, chart, labs and discussed the procedure including the risks, benefits and alternatives for the proposed anesthesia with the patient or authorized representative who has indicated his/her understanding and acceptance.     Dental advisory given  Plan Discussed with: CRNA and Anesthesiologist  Anesthesia Plan Comments: (Per cardiology 08/10/20, "Chart reviewed as part of pre-operative protocol coverage. Patient was recently seen by [name] on 07/15/2020 and was doing well from a cardiac standpoint at that time. Patient contacted today for further pre-op evaluation. She has done well since last office visit. She notes occasionally feeling of heart racing if she is being more active or there is "a lot going on" but no other palpitations. No chest pain, shortness of breath, orthopnea, dizziness (other than mild dizziness with position  changes), or  syncope. Able to complete >4.0 METS without any anginal symptoms. Given past medical history and time since last visit, based on ACC/AHA guidelines, Marva Hendryx would be at acceptable risk for the planned procedure without further cardiovascular testing.   Per Pharmacy and office protocol, patient can hold Xarelto for 2-3 days prior to procedure. This should be restarted as soon as able postoperatively.")       Anesthesia Quick Evaluation

## 2020-09-08 ENCOUNTER — Other Ambulatory Visit: Payer: Self-pay | Admitting: Family Medicine

## 2020-09-08 DIAGNOSIS — I4891 Unspecified atrial fibrillation: Secondary | ICD-10-CM

## 2020-09-13 ENCOUNTER — Other Ambulatory Visit (HOSPITAL_COMMUNITY)
Admission: RE | Admit: 2020-09-13 | Discharge: 2020-09-13 | Disposition: A | Discharge status: Home / Self Care | Payer: Medicare Other | Source: Ambulatory Visit | Attending: Urology | Admitting: Urology

## 2020-09-13 DIAGNOSIS — Z20822 Contact with and (suspected) exposure to covid-19: Secondary | ICD-10-CM | POA: Diagnosis not present

## 2020-09-13 DIAGNOSIS — Z01812 Encounter for preprocedural laboratory examination: Secondary | ICD-10-CM | POA: Diagnosis not present

## 2020-09-13 LAB — SARS CORONAVIRUS 2 (TAT 6-24 HRS): SARS Coronavirus 2: NEGATIVE

## 2020-09-15 DIAGNOSIS — H02831 Dermatochalasis of right upper eyelid: Secondary | ICD-10-CM | POA: Diagnosis not present

## 2020-09-15 DIAGNOSIS — H02834 Dermatochalasis of left upper eyelid: Secondary | ICD-10-CM | POA: Diagnosis not present

## 2020-09-15 DIAGNOSIS — Z961 Presence of intraocular lens: Secondary | ICD-10-CM | POA: Diagnosis not present

## 2020-09-15 DIAGNOSIS — H04123 Dry eye syndrome of bilateral lacrimal glands: Secondary | ICD-10-CM | POA: Diagnosis not present

## 2020-09-15 DIAGNOSIS — H353131 Nonexudative age-related macular degeneration, bilateral, early dry stage: Secondary | ICD-10-CM | POA: Diagnosis not present

## 2020-09-15 DIAGNOSIS — D3131 Benign neoplasm of right choroid: Secondary | ICD-10-CM | POA: Diagnosis not present

## 2020-09-15 MED ORDER — GENTAMICIN SULFATE 40 MG/ML IJ SOLN
5.0000 mg/kg | Freq: Once | INTRAVENOUS | Status: AC
  Administered 2020-09-16: 280 mg via INTRAVENOUS
  Filled 2020-09-15: qty 7

## 2020-09-15 NOTE — H&P (Signed)
Office Visit Report     08/03/2020   --------------------------------------------------------------------------------   Ashlee Warren  MRN: [id number]  DOB: 1928-06-14, 86 year old Female  SSN:    PRIMARY CARE:  Chrissie Noa, MD  REFERRING:  Chrissie Noa, MD  PROVIDER:  Rexene Alberts, M.D.  LOCATION:  [facility] [phone]     --------------------------------------------------------------------------------   CC/HPI: Ms. Ashlee Warren is a 86-year-old female seen in follow-up today for a distal right ureteral stone measuring 5 mm.   CT abdomen pelvis 07/01/2020 identified a partially obstructing 5 mm calculus in the distal right ureter. She is also found to have a punctate calculus in the lower pole of the right kidney. Additionally she was found to have a 5 mm calculus in the left interpolar region.   She states that she is not pain with a pass her stone. She still complains of intermittent right flank pain. She states that some of this pain is related to her right-sided inguinal hernia which is containing fat. She denies fevers or chills. She been having regular bowel movements. She denies dysuria. She denies increased frequency or urgency.   KUB today 08/03/2020 with inability to identify a distal right ureteral stone.   She does have a past medical history of atrial fibrillation is on Xarelto.     ALLERGIES: penicillin  SULFA Tamsulosin - Headache, tongue and lip swelling    MEDICATIONS: Diltiazem Hcl  Xarelto     GU PSH: None   NON-GU PSH: Breast Biopsy Tonsillectomy     GU PMH: Flank Pain - 07/06/2020 Renal and ureteral calculus - 07/06/2020    NON-GU PMH: Atrial Fibrillation Hypertension    FAMILY HISTORY: None   SOCIAL HISTORY: Marital Status: Widowed Ethnicity: Not Hispanic Or Latino; Race: White Current Smoking Status: Patient has never smoked.   Tobacco Use Assessment Completed: Used Tobacco in last 30 days? Has never drank.  Drinks  2 caffeinated drinks per day.    REVIEW OF SYSTEMS:    GU Review Female:   Patient denies frequent urination, hard to postpone urination, burning /pain with urination, get up at night to urinate, leakage of urine, stream starts and stops, trouble starting your stream, have to strain to urinate, and being pregnant.  Gastrointestinal (Upper):   Patient denies indigestion/ heartburn, nausea, and vomiting.  Gastrointestinal (Lower):   Patient denies diarrhea and constipation.  Constitutional:   Patient denies fever, night sweats, weight loss, and fatigue.  Skin:   Patient denies skin rash/ lesion and itching.  Eyes:   Patient denies blurred vision and double vision.  Ears/ Nose/ Throat:   Patient denies sore throat and sinus problems.  Hematologic/Lymphatic:   Patient denies swollen glands and easy bruising.  Cardiovascular:   Patient denies leg swelling and chest pains.  Respiratory:   Patient denies cough and shortness of breath.  Endocrine:   Patient denies excessive thirst.  Musculoskeletal:   Patient reports back pain. Patient denies joint pain.  Neurological:   Patient denies headaches and dizziness.  Psychologic:   Patient denies depression and anxiety.   VITAL SIGNS:      08/03/2020 11:01 AM  Weight 120.4 lb / 54.61 kg  Height 62 in / 157.48 cm  BP 164/88 mmHg  Pulse 96 /min  Temperature 96.0 F / 35.5 C  BMI 22.0 kg/m   MULTI-SYSTEM PHYSICAL EXAMINATION:    Constitutional: Well-nourished. No physical deformities. Normally developed. Good grooming.  Respiratory: No labored breathing, no use of accessory  muscles.   Cardiovascular: Normal temperature, normal extremity pulses, no swelling, no varicosities.  Gastrointestinal: No mass, no tenderness, no rigidity, non obese abdomen. No CVA tenderness     Complexity of Data:  Source Of History:  Patient, Medical Record Summary  Records Review:   Previous Doctor Records, Previous Patient Records  Urine Test Review:   Urinalysis   X-Ray Review: KUB: Reviewed Films. Reviewed Report.  C.T. Abdomen/Pelvis: Reviewed Films. Reviewed Report.    Notes:                     This result has an attachment that is not available.  CLINICAL DATA: Gross hematuria. Palpable right groin not for  several months. No history of malignancy or prior relevant surgery.   EXAM:  CT ABDOMEN AND PELVIS WITHOUT AND WITH CONTRAST   TECHNIQUE:  Multidetector CT imaging of the abdomen and pelvis was performed  following the standard protocol before and following the bolus  administration of intravenous contrast.   CONTRAST: 100 cc Omnipaque 300   COMPARISON: Limited correlation made with chest CTA 09/29/2012.   FINDINGS:  Lower chest: Fibrotic changes at both lung bases. No significant  pleural or pericardial effusion. There is atherosclerosis of the  aorta and coronary arteries.   Hepatobiliary: The liver is normal in density without suspicious  focal abnormality. No evidence of gallstones, gallbladder wall  thickening or biliary dilatation.   Pancreas: Unremarkable. No pancreatic ductal dilatation or  surrounding inflammatory changes.   Spleen: Normal in size without focal abnormality.   Adrenals/Urinary Tract: Both adrenal glands appear normal. Pre  contrast images demonstrate a nonobstructing 5 mm calculus in the  interpolar region of the left kidney. There is a tiny calculus in  the lower pole of the right kidney. In the distal right ureter, just  above the ureterovesical junction, there is a partially obstructing  5 mm calculus which is not well seen on the scout image. There is  resulting mild right-sided hydroureter, but only slightly delayed  contrast excretion. There is no significant perinephric soft tissue  stranding. Small renal cysts are present bilaterally. Delayed images  result in segmental visualization of the ureters. No focal upper  tract urothelial abnormalities are identified. The bladder appears   unremarkable.   Stomach/Bowel: No evidence of bowel wall thickening, distention or  surrounding inflammatory change. The appendix appears normal. Mild  distal colonic diverticulosis.   Vascular/Lymphatic: There are no enlarged abdominal or pelvic lymph  nodes. Aortic and branch vessel atherosclerosis without acute  vascular findings. The portal, superior mesenteric and splenic veins  are patent.   Reproductive: The uterus and ovaries appear normal. No adnexal mass.   Other: There is a small right inguinal hernia containing only fat,  likely accounting for the palpable concern. No herniated bowel or  groin mass. No ascites.   Musculoskeletal: No acute or significant osseous findings.   IMPRESSION:  1. Partially obstructing 5 mm calculus in the distal right ureter.  2. Nonobstructing bilateral renal calculi.  3. Right inguinal hernia containing only fat.  4. Small renal cysts, fibrotic changes at both lung bases and aortic  Atherosclerosis (ICD10-I70.0).    Electronically Signed  By: Richardean Sale M.D.  On: 07/01/2020 12:39      PROCEDURES:         KUB - 74018  A single view of the abdomen is obtained. Inability to identify distal right ureteral stone.      Patient confirmed No Neulasta OnPro Device.  Urinalysis w/Scope - 81001 Dipstick Dipstick Cont'd Micro  Color: Yellow Bilirubin: Neg WBC/hpf: 6 - 10/hpf  Appearance: Clear Ketones: Neg RBC/hpf: 3 - 10/hpf  Specific Gravity: 1.025 Blood: Trace Bacteria: Rare (0-9/hpf)  pH: 6.0 Protein: Trace Cystals: NS (Not Seen)  Glucose: Neg Urobilinogen: 1.0 Casts: NS (Not Seen)    Nitrites: Neg Trichomonas: Not Present    Leukocyte Esterase: Trace Mucous: Not Present      Epithelial Cells: NS (Not Seen)      Yeast: NS (Not Seen)      Sperm: Not Present    Notes:  unspun microscopic performed     ASSESSMENT:      ICD-10 Details  1 GU:   Flank Pain - R10.84   2   Renal and ureteral calculus - N20.2     PLAN:            Medications Stop Meds: Tamsulosin Hcl 0.4 mg capsule 1 capsule PO Q HS  Start: 07/28/2020  Stop: 02/23/2021   Discontinue: 08/03/2020  - Reason: The patient suffered unacceptable side effects.            Orders Labs CULTURE, URINE  X-Rays: KUB          Schedule X-Rays: 1 Week - C.T. Abdomen/Pelvis Without Contrast          Document Letter(s):  Created for Patient: Clinical Summary         Notes:   1. Urolithiasis:  -CT A/P 9/17 with 53m distal right ureteral stone with mild hydronephrosis. Additional punctacte right lower pole stone. Additional non-obstructing 580mleft interpolar stone.  -KUB 08/03/20 with inability to identify distal right ureteral stone  -Discussed that ESWL is not an option given inability to identify stone with KUB  -We will arrange for repeat CT A/P to confirm presence of stone prior to cystoscopy, R URS/LL, R RPG, stent placement  -Surgery letter sent for cysto, R URS/LL, R RPG, stent placement  -We will need cardiac clearance prior given history of atrial fibrillation on Xarelto. Will press to hold this for a few days around the time of surgery.  -In the meantime, proceed with MET  -Urine sent for culture today  -Advised pt to RTC or present to ED if develops fevers/chills and worsening flank pain.   Ureteroscopy: risks and benefits of ureteroscopy were outlined, including infection, bleeding, pain, temporary ureteral stent and associated stent bother, ureteral injury, ureteral stricture, need for ancillary treatments, and global anesthesia risks including but not limited to CVA, MI, DVT, PE, pneumonia, and death.   2. Right flank pain: Due to partially obstructed distal right ureteral stone as above. Continue MET.   CC: G Chrissie NoaMD     Signed by MaRexene AlbertsM.D. on 08/04/20 at 11:45 PM (EDT)  Urology Preoperative H&P   Chief Complaint: Right ureteral stone  History of Present Illness: Ashlee Warren a [age].o.  female with a 5 mm distal right ureteral stone here for definitive treatment of her stone.  She denies recent fevers or chills.  She had a negative urine culture.    Past Medical History:  Diagnosis Date  . Atrial fibrillation (HCEncinal  . Dysrhythmia    afib  . Essential hypertension     Past Surgical History:  Procedure Laterality Date  . BREAST BIOPSY     left  . CARDIOVERSION N/A 03/11/2013   Procedure: CARDIOVERSION;  Surgeon: PeJosue HectorMD;  [facility] Service: Cardiovascular;  Laterality: N/A;  . CATARACT EXTRACTION, BILATERAL  2000  . EYE SURGERY    . TONSILLECTOMY      Allergies:  Allergies  Allergen Reactions  . Penicillins Rash  . Sulfonamide Derivatives Rash  . Tamsulosin Swelling    Swollen Lips    Family History  Problem Relation Age of Onset  . Cancer Mother        leukemia  . Cancer Sister     Social History:  reports that she has never smoked. She has never used smokeless tobacco. She reports that she does not drink alcohol and does not use drugs.  ROS: A complete review of systems was performed.  All systems are negative except for pertinent findings as noted.  Physical Exam:  Vital signs in last 24 hours:   Constitutional:  Alert and oriented, No acute distress Cardiovascular: Regular rate and rhythm Respiratory: Normal respiratory effort, Lungs clear bilaterally GI: Abdomen is soft, nontender, nondistended, no abdominal masses GU: No CVA tenderness Lymphatic: No lymphadenopathy Neurologic: Grossly intact, no focal deficits Psychiatric: Normal mood and affect  Laboratory Data:  No results for input(s): WBC, HGB, HCT, PLT in the last 72 hours.  No results for input(s): NA, K, CL, GLUCOSE, BUN, CALCIUM, CREATININE in the last 72 hours.  Invalid input(s): CO3   No results found for this or any previous visit (from the past 24 hour(s)). Recent Results (from the past 240 hour(s))  SARS CORONAVIRUS 2 (TAT 6-24 HRS)  Nasopharyngeal Nasopharyngeal Swab     Status: None   Collection Time: 09/13/20  1:23 PM   Specimen: Nasopharyngeal Swab  Result Value Ref Range Status   SARS Coronavirus 2 NEGATIVE NEGATIVE Final    Comment: (NOTE) SARS-CoV-2 target nucleic acids are NOT DETECTED.  The SARS-CoV-2 RNA is generally detectable in upper and lower respiratory specimens during the acute phase of infection. Negative results do not preclude SARS-CoV-2 infection, do not rule out co-infections with other pathogens, and should not be used as the sole basis for treatment or other patient management decisions. Negative results must be combined with clinical observations, patient history, and epidemiological information. The expected result is Negative.  Fact Sheet for Patients: SugarRoll.be  Fact Sheet for Healthcare Providers: https://www.woods-mathews.com/  This test is not yet approved or cleared by the [place] FDA and  has been authorized for detection and/or diagnosis of SARS-CoV-2 by FDA under an Emergency Use Authorization (EUA). This EUA will remain  in effect (meaning this test can be used) for the duration of the COVID-19 declaration under Se ction 564(b)(1) of the Act, 21 U.S.C. section 360bbb-3(b)(1), unless the authorization is terminated or revoked sooner.  Performed at [facility], [place] [address], [place], [place] [address]     Renal Function: No results for input(s): CREATININE in the last 168 hours. Estimated Creatinine Clearance: 29 mL/min (A) (by C-G formula based on SCr of 1.02 mg/dL (H)).  Radiologic Imaging: No results found.  I independently reviewed the above imaging studies.  Assessment and Plan Ashlee Warren is a 86 y.o. female with  5 mm distal right ureteral stone here for definitive treatment of her stone.  She denies recent fevers or chills.  She had a negative urine culture.  Okay to proceed.  -The risks,  benefits and alternatives of cystoscopy right ureteroscopy, laser lithotripsy, basket traction of stone with right JJ stent placement was discussed with the patient.  Risks include, but are not limited to: bleeding, urinary tract infection, ureteral injury,  ureteral stricture disease, chronic pain, urinary symptoms, bladder injury, stent migration, the need for nephrostomy tube placement, MI, CVA, DVT, PE and the inherent risks with general anesthesia.  The patient voices understanding and wishes to proceed.   Matt R. Yahayra Geis MD 09/15/2020, 2:47 PM  [facility] Pager: [phone]): [phone]

## 2020-09-16 ENCOUNTER — Encounter (HOSPITAL_COMMUNITY): Payer: Self-pay | Admitting: Urology

## 2020-09-16 ENCOUNTER — Ambulatory Visit (HOSPITAL_COMMUNITY): Payer: Medicare Other | Admitting: Physician Assistant

## 2020-09-16 ENCOUNTER — Encounter (HOSPITAL_COMMUNITY)
Admission: RE | Disposition: A | Discharge status: Home / Self Care | Payer: Self-pay | Source: Home / Self Care | Attending: Urology

## 2020-09-16 ENCOUNTER — Ambulatory Visit (HOSPITAL_COMMUNITY): Payer: Medicare Other

## 2020-09-16 ENCOUNTER — Ambulatory Visit (HOSPITAL_COMMUNITY): Payer: Medicare Other | Admitting: Anesthesiology

## 2020-09-16 ENCOUNTER — Ambulatory Visit (HOSPITAL_COMMUNITY)
Admission: RE | Admit: 2020-09-16 | Discharge: 2020-09-16 | Disposition: A | Discharge status: Home / Self Care | Payer: Medicare Other | Attending: Urology | Admitting: Urology

## 2020-09-16 DIAGNOSIS — N201 Calculus of ureter: Secondary | ICD-10-CM | POA: Diagnosis not present

## 2020-09-16 DIAGNOSIS — Z882 Allergy status to sulfonamides status: Secondary | ICD-10-CM | POA: Insufficient documentation

## 2020-09-16 DIAGNOSIS — K4090 Unilateral inguinal hernia, without obstruction or gangrene, not specified as recurrent: Secondary | ICD-10-CM | POA: Diagnosis not present

## 2020-09-16 DIAGNOSIS — Z888 Allergy status to other drugs, medicaments and biological substances status: Secondary | ICD-10-CM | POA: Diagnosis not present

## 2020-09-16 DIAGNOSIS — Z880 Allergy status to penicillin: Secondary | ICD-10-CM | POA: Insufficient documentation

## 2020-09-16 DIAGNOSIS — Z87442 Personal history of urinary calculi: Secondary | ICD-10-CM | POA: Insufficient documentation

## 2020-09-16 DIAGNOSIS — I4891 Unspecified atrial fibrillation: Secondary | ICD-10-CM | POA: Diagnosis not present

## 2020-09-16 DIAGNOSIS — N281 Cyst of kidney, acquired: Secondary | ICD-10-CM | POA: Diagnosis not present

## 2020-09-16 DIAGNOSIS — I10 Essential (primary) hypertension: Secondary | ICD-10-CM | POA: Diagnosis not present

## 2020-09-16 DIAGNOSIS — I700 Atherosclerosis of aorta: Secondary | ICD-10-CM | POA: Diagnosis not present

## 2020-09-16 DIAGNOSIS — R109 Unspecified abdominal pain: Secondary | ICD-10-CM | POA: Insufficient documentation

## 2020-09-16 DIAGNOSIS — Z7901 Long term (current) use of anticoagulants: Secondary | ICD-10-CM | POA: Diagnosis not present

## 2020-09-16 DIAGNOSIS — F32A Depression, unspecified: Secondary | ICD-10-CM | POA: Diagnosis not present

## 2020-09-16 DIAGNOSIS — R319 Hematuria, unspecified: Secondary | ICD-10-CM | POA: Diagnosis not present

## 2020-09-16 HISTORY — PX: CYSTOSCOPY/URETEROSCOPY/HOLMIUM LASER/STENT PLACEMENT: SHX6546

## 2020-09-16 SURGERY — CYSTOSCOPY/URETEROSCOPY/HOLMIUM LASER/STENT PLACEMENT
Anesthesia: General | Laterality: Right

## 2020-09-16 MED ORDER — CIPROFLOXACIN HCL 500 MG PO TABS: 500.0000 mg | ORAL_TABLET | Freq: Two times a day (BID) | ORAL | 0 refills | Status: AC

## 2020-09-16 MED ORDER — ONDANSETRON HCL 4 MG/2ML IJ SOLN
INTRAMUSCULAR | Status: DC | PRN
  Administered 2020-09-16: 4 mg via INTRAVENOUS

## 2020-09-16 MED ORDER — FENTANYL CITRATE (PF) 100 MCG/2ML IJ SOLN
INTRAMUSCULAR | Status: AC
  Filled 2020-09-16: qty 2

## 2020-09-16 MED ORDER — OXYCODONE HCL 5 MG/5ML PO SOLN: 5.0000 mg | Freq: Once | ORAL | Status: DC | PRN

## 2020-09-16 MED ORDER — PROPOFOL 10 MG/ML IV BOLUS
INTRAVENOUS | Status: DC | PRN
  Administered 2020-09-16: 80 mg via INTRAVENOUS

## 2020-09-16 MED ORDER — LIDOCAINE 2% (20 MG/ML) 5 ML SYRINGE
INTRAMUSCULAR | Status: DC | PRN
  Administered 2020-09-16: 40 mg via INTRAVENOUS

## 2020-09-16 MED ORDER — IOHEXOL 300 MG/ML  SOLN
INTRAMUSCULAR | Status: DC | PRN
  Administered 2020-09-16: 3 mL

## 2020-09-16 MED ORDER — FENTANYL CITRATE (PF) 100 MCG/2ML IJ SOLN: 25.0000 ug | INTRAMUSCULAR | Status: DC | PRN

## 2020-09-16 MED ORDER — SODIUM CHLORIDE 0.9 % IR SOLN
Status: DC | PRN
  Administered 2020-09-16: 3000 mL

## 2020-09-16 MED ORDER — OXYCODONE HCL 5 MG PO TABS: 5.0000 mg | ORAL_TABLET | Freq: Once | ORAL | Status: DC | PRN

## 2020-09-16 MED ORDER — HYDROCODONE-ACETAMINOPHEN 5-325 MG PO TABS
0.5000 | ORAL_TABLET | Freq: Four times a day (QID) | ORAL | 0 refills | Status: DC | PRN
Start: 2020-09-16 — End: 2021-01-20

## 2020-09-16 MED ORDER — ONDANSETRON HCL 4 MG/2ML IJ SOLN
INTRAMUSCULAR | Status: AC
  Filled 2020-09-16: qty 2

## 2020-09-16 MED ORDER — DOCUSATE SODIUM 100 MG PO CAPS: 100.0000 mg | ORAL_CAPSULE | Freq: Every day | ORAL | 0 refills | Status: DC | PRN

## 2020-09-16 MED ORDER — LACTATED RINGERS IV SOLN: INTRAVENOUS | Status: DC

## 2020-09-16 MED ORDER — CHLORHEXIDINE GLUCONATE 0.12 % MT SOLN
15.0000 mL | Freq: Once | OROMUCOSAL | Status: AC
  Administered 2020-09-16: 15 mL via OROMUCOSAL

## 2020-09-16 MED ORDER — ORAL CARE MOUTH RINSE: 15.0000 mL | Freq: Once | OROMUCOSAL | Status: AC

## 2020-09-16 MED ORDER — LIDOCAINE HCL (PF) 2 % IJ SOLN
INTRAMUSCULAR | Status: AC
  Filled 2020-09-16: qty 5

## 2020-09-16 MED ORDER — DEXAMETHASONE SODIUM PHOSPHATE 10 MG/ML IJ SOLN
INTRAMUSCULAR | Status: DC | PRN
  Administered 2020-09-16: 5 mg via INTRAVENOUS

## 2020-09-16 MED ORDER — ONDANSETRON HCL 4 MG/2ML IJ SOLN: 4.0000 mg | Freq: Once | INTRAMUSCULAR | Status: DC | PRN

## 2020-09-16 MED ORDER — FENTANYL CITRATE (PF) 100 MCG/2ML IJ SOLN
INTRAMUSCULAR | Status: DC | PRN
  Administered 2020-09-16: 25 ug via INTRAVENOUS

## 2020-09-16 MED ORDER — DEXAMETHASONE SODIUM PHOSPHATE 10 MG/ML IJ SOLN
INTRAMUSCULAR | Status: AC
  Filled 2020-09-16: qty 1

## 2020-09-16 MED ORDER — PROPOFOL 10 MG/ML IV BOLUS
INTRAVENOUS | Status: AC
  Filled 2020-09-16: qty 20

## 2020-09-16 SURGICAL SUPPLY — 21 items
BAG URO CATCHER STRL LF (MISCELLANEOUS) ×2 IMPLANT
BASKET ZERO TIP NITINOL 2.4FR (BASKET) IMPLANT
CATH URET 5FR 28IN OPEN ENDED (CATHETERS) ×2 IMPLANT
CLOTH BEACON ORANGE TIMEOUT ST (SAFETY) ×2 IMPLANT
FIBER LASER MOSES 200 DFL (Laser) IMPLANT
GLOVE BIOGEL M 7.0 STRL (GLOVE) ×2 IMPLANT
GOWN STRL REUS W/TWL LRG LVL3 (GOWN DISPOSABLE) ×2 IMPLANT
GUIDEWIRE STR DUAL SENSOR (WIRE) ×2 IMPLANT
GUIDEWIRE ZIPWRE .038 STRAIGHT (WIRE) ×2 IMPLANT
IV NS 1000ML (IV SOLUTION) ×2
IV NS 1000ML BAXH (IV SOLUTION) ×1 IMPLANT
KIT TURNOVER KIT A (KITS) IMPLANT
LASER FIB FLEXIVA PULSE ID 365 (Laser) IMPLANT
MANIFOLD NEPTUNE II (INSTRUMENTS) ×2 IMPLANT
PACK CYSTO (CUSTOM PROCEDURE TRAY) ×2 IMPLANT
SHEATH URETERAL 12FRX35CM (MISCELLANEOUS) IMPLANT
STENT URET 6FRX24 CONTOUR (STENTS) ×2 IMPLANT
TRACTIP FLEXIVA PULS ID 200XHI (Laser) IMPLANT
TRACTIP FLEXIVA PULSE ID 200 (Laser)
TUBING CONNECTING 10 (TUBING) ×2 IMPLANT
TUBING UROLOGY SET (TUBING) ×2 IMPLANT

## 2020-09-16 NOTE — Discharge Instructions (Signed)
   Activity:  You are encouraged to ambulate frequently (about every hour during waking hours) to help prevent blood clots from forming in your legs or lungs.  However, you should not engage in any heavy lifting (> 10-15 lbs), strenuous activity, or straining.   Diet: You should advance your diet as instructed by your physician.  It will be normal to have some bloating, nausea, and abdominal discomfort intermittently.   Prescriptions:  You will be provided a prescription for pain medication to take as needed.  If your pain is not severe enough to require the prescription pain medication, you may take extra strength Tylenol instead which will have less side effects.  You should also take a prescribed stool softener to avoid straining with bowel movements as the prescription pain medication may constipate you.   What to call us about: You should call the office [phone]) if you develop fever > 101 or develop persistent vomiting. Activity:  You are encouraged to ambulate frequently (about every hour during waking hours) to help prevent blood clots from forming in your legs or lungs.  However, you should not engage in any heavy lifting (> 10-15 lbs), strenuous activity, or straining.  You have a ureteral stent in place. Remove this on Monday AM by pulling on string taped to your thigh.

## 2020-09-16 NOTE — Op Note (Signed)
Operative Note  Preoperative diagnosis:  1.  Right ureteral stone  Postoperative diagnosis: 1.  No evidence of right ureteral stone  Procedure(s): 1.  Cystoscopy 2. Right retrograde pyelogram 3. Right diagnostic ureteroscopy 4. Right ureteral stent placement 5. Fluoroscopy <1 hour with intraoperative interpretation  Surgeon: Rexene Alberts, MD  Assistants:  None  Anesthesia:  General  Complications:  None  EBL:  Minimal  Specimens: 1. none  Drains/Catheters: 1.  6Fr x 24 cm right ureteral stent  Intraoperative findings:   1. Cystoscopy demonstrated no suspicious bladder lesions, stones, or other pathology.  Her bilateral ureteral orifices were in their normal orthotopic position. 2. Right diagnostic ureteroscopy revealed no evidence of any ureteral stones.  The ureter was surveyed at least 3 separate times with no finding of any stone along the course of the ureter. 3. Systemic right pyeloscopy and each individual calyx multiple times revealed no evidence of any stones within her right collecting system. 4. Right retrograde pyelogram demonstrated no significant hydronephrosis.  There is no filling defects.  The calyces were thin and delicate and drained promptly. 5. Cystoscopy right ureteral stent placement with curl within the renal pelvis and bladder respectively attached to a tether string which exited her urethra meatus. 6. No significant bleeding.  Indication:  Ashlee Warren is a 86 y.o. female with a 5 mm distal right ureteral stone initially seen on CT A/P 07/01/2020.  She initially was placed on medical expulsive therapy however was unable to pass her stone.  KUB 08/03/2020 was unable to identify the stone.  That she was not a candidate for ESWL.  Repeat CT A/P demonstrated some progression of her stone along the course of her distal ureter however she still is under the past this.  Given this, she is being brought to the operating today for definitive treatment of her  right ureteral stone.  Preoperative urine culture was negative on 08/04/2020.  She has had no further dysuria.  She has no fevers or chills.  Description of procedure: After informed consent was obtained from the patient, the patient was identified and taken to the operating room and placed in the supine position.  General anesthesia was administered as well as perioperative IV antibiotics.  At the beginning of the case, a time-out was performed to properly identify the patient, the surgery to be performed, and the surgical site.  Sequential compression devices were applied to the lower extremities at the beginning of the case for DVT prophylaxis.  The patient was then placed in the dorsal lithotomy supine position, prepped and draped in sterile fashion.  Preliminary scout fluoroscopy revealed no calcifications. We then passed the 21-French rigid cystoscope through the urethra and into the bladder under vision without any difficulty, noting a normal urethra without strictures.  A systematic evaluation of the bladder revealed no evidence of any suspicious bladder lesions.  Ureteral orifices were in normal position.    Under cystoscopic and flouroscopic guidance, we cannulated the right ureteral orifice with a 5-French open-ended ureteral catheter along with a 0.038 zip wire.  The wire was passed in the renal pelvis.  This secured as a safety wire.  I then introduced a semirigid ureteroscope and passed this along the ureter.  This passed to the level of the mid ureter.  There were no calculi evident.  I then passed a separate 0.038 sensor wire up to level renal pelvis.  I then withdrew the semirigid ureteroscope leaving both wires in place.  I then passed a digital  ureteroscope over the zip wire.  This was then passed along the course of the ureter were found no evidence of any ureteral stone.  This was then placed into the collecting system and surveyed the calyces independently and thoroughly 3 separate  times.  There is no evidence of any calculi within any of the calyces.  I then performed a right retrograde pyelogram.  There were no filling defects.  There is no segment hydronephrosis.  Each calyx was then independently surveyed again without any evidence of any stone.  We then withdrew the ureteroscope along the course of the ureter revealing no other stone and no evidence of any significant trauma.  Once the ureteroscope was removed, the Glidewire was backloaded through the rigid cystoscope, which was then advanced down the urethra and into the bladder. We then used the Glidewire under direct vision through the rigid cystoscope and under fluoroscopic guidance and passed up a 6-French, 24 cm double-pigtail ureteral stent up ureter, making sure that the proximal and distal ends coiled within the kidney and bladder respectively.  Note that we left a long tether string attached to the distal end of the ureteral stent and it exited the urethral meatus and was secured to the inner thigh with a tegaderm adhesive.  The cystoscope was then advanced back into the bladder under vision.  We were able to see the distal stent coiling nicely within the bladder.  The bladder was then emptied with irrigation solution.  The cystoscope was then removed.    The patient tolerated the procedure well and there was no complication. Patient was awoken from anesthesia and taken to the recovery room in stable condition. I was present and scrubbed for the entirety of the case.  Plan:  Patient will be discharged home.  She will remove her ureteral stent in 3 days.  She will follow-up with [place] in approximately 4-6 weeks for KUB and renal bladder ultrasound.   Matt R. [facility]  Pager: [phone]

## 2020-09-16 NOTE — Anesthesia Postprocedure Evaluation (Signed)
Anesthesia Post Note  Patient: Ashlee Warren  Procedure(s) Performed: CYSTOSCOPY, RETROGRADE PYELOGRAM, URETEROSCOPY, STENT PLACEMENT (Right )     Patient location during evaluation: PACU Anesthesia Type: General Level of consciousness: awake and alert Pain management: pain level controlled Vital Signs Assessment: post-procedure vital signs reviewed and stable Respiratory status: spontaneous breathing, nonlabored ventilation, respiratory function stable and patient connected to nasal cannula oxygen Cardiovascular status: blood pressure returned to baseline and stable Postop Assessment: no apparent nausea or vomiting Anesthetic complications: no   No complications documented.  Last Vitals:  Vitals:   09/16/20 1550 09/16/20 1553  BP:  (!) 145/64  Pulse:  74  Resp:  14  Temp:  36.6 C  SpO2: 97% 98%    Last Pain:  Vitals:   09/16/20 1553  PainSc: 0-No pain                 Deziyah Arvin COKER

## 2020-09-16 NOTE — Anesthesia Procedure Notes (Signed)
Procedure Name: LMA Insertion Date/Time: 09/16/2020 2:38 PM Performed by: Sharlette Dense, CRNA Patient Re-evaluated:Patient Re-evaluated prior to induction Oxygen Delivery Method: Circle system utilized Preoxygenation: Pre-oxygenation with 100% oxygen Induction Type: IV induction Ventilation: Mask ventilation without difficulty LMA: LMA inserted LMA Size: 3.0 Number of attempts: 2 Placement Confirmation: positive ETCO2 and breath sounds checked- equal and bilateral Tube secured with: Tape Dental Injury: Teeth and Oropharynx as per pre-operative assessment

## 2020-09-16 NOTE — Transfer of Care (Signed)
Immediate Anesthesia Transfer of Care Note  Patient: Ashlee Warren  Procedure(s) Performed: CYSTOSCOPY, RETROGRADE PYELOGRAM, URETEROSCOPY, STENT PLACEMENT (Right )  Patient Location: PACU  Anesthesia Type:General  Level of Consciousness: drowsy  Airway & Oxygen Therapy: Patient Spontanous Breathing and Patient connected to face mask oxygen  Post-op Assessment: Report given to RN and Post -op Vital signs reviewed and stable  Post vital signs: Reviewed and stable  Last Vitals:  Vitals Value Taken Time  BP 143/74 09/16/20 1522  Temp    Pulse 84 09/16/20 1523  Resp 15 09/16/20 1523  SpO2 100 % 09/16/20 1523  Vitals shown include unvalidated device data.  Last Pain: There were no vitals filed for this visit.       Complications: No complications documented.

## 2020-09-16 NOTE — Progress Notes (Signed)
Pt states she lost her husband recently and may be teary at times.  Ambulates to short stay without difficulty, HOH.

## 2020-09-17 ENCOUNTER — Encounter (HOSPITAL_COMMUNITY): Payer: Self-pay | Admitting: Urology

## 2020-09-27 ENCOUNTER — Other Ambulatory Visit: Payer: Self-pay | Admitting: Cardiovascular Disease

## 2020-09-28 NOTE — Telephone Encounter (Signed)
Prescription refill request for Xarelto received.   Indication: afib Last office visit: Nishan 07/15/2020 Weight: 56.2 kg Age: 86 yo Scr: 1.02, 09/06/2020 CrCl: 32 ml/min   Prescription refill sent.

## 2020-10-02 IMAGING — DX RIGHT KNEE - COMPLETE 4+ VIEW
4 series · 4 of 4 positions shown · non-contrast
Comparison: None.

CLINICAL DATA: Pain

EXAM:
RIGHT KNEE - COMPLETE 4+ VIEW

[knee ap]
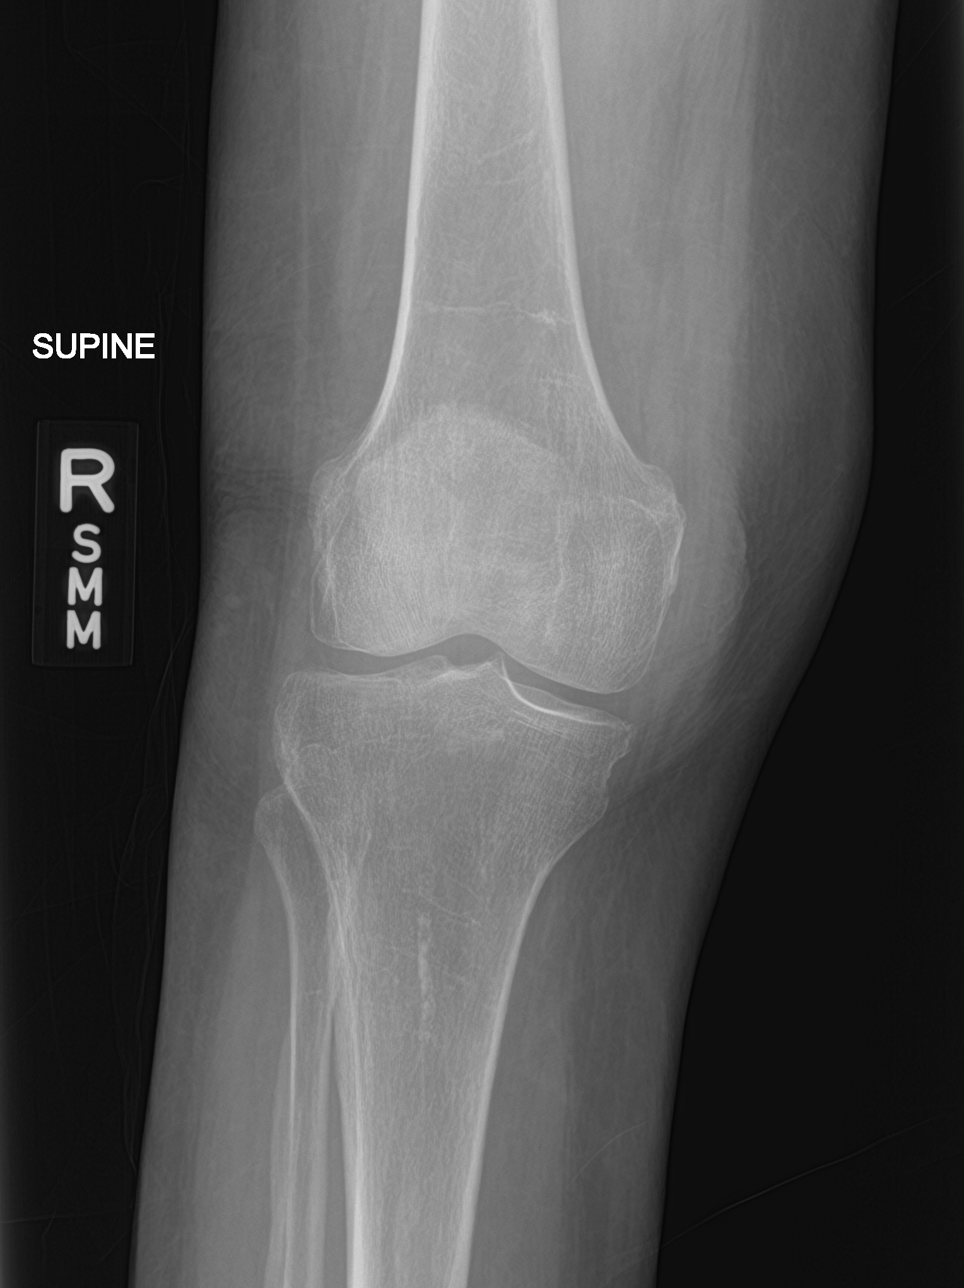

[knee obl (1 of 2)]
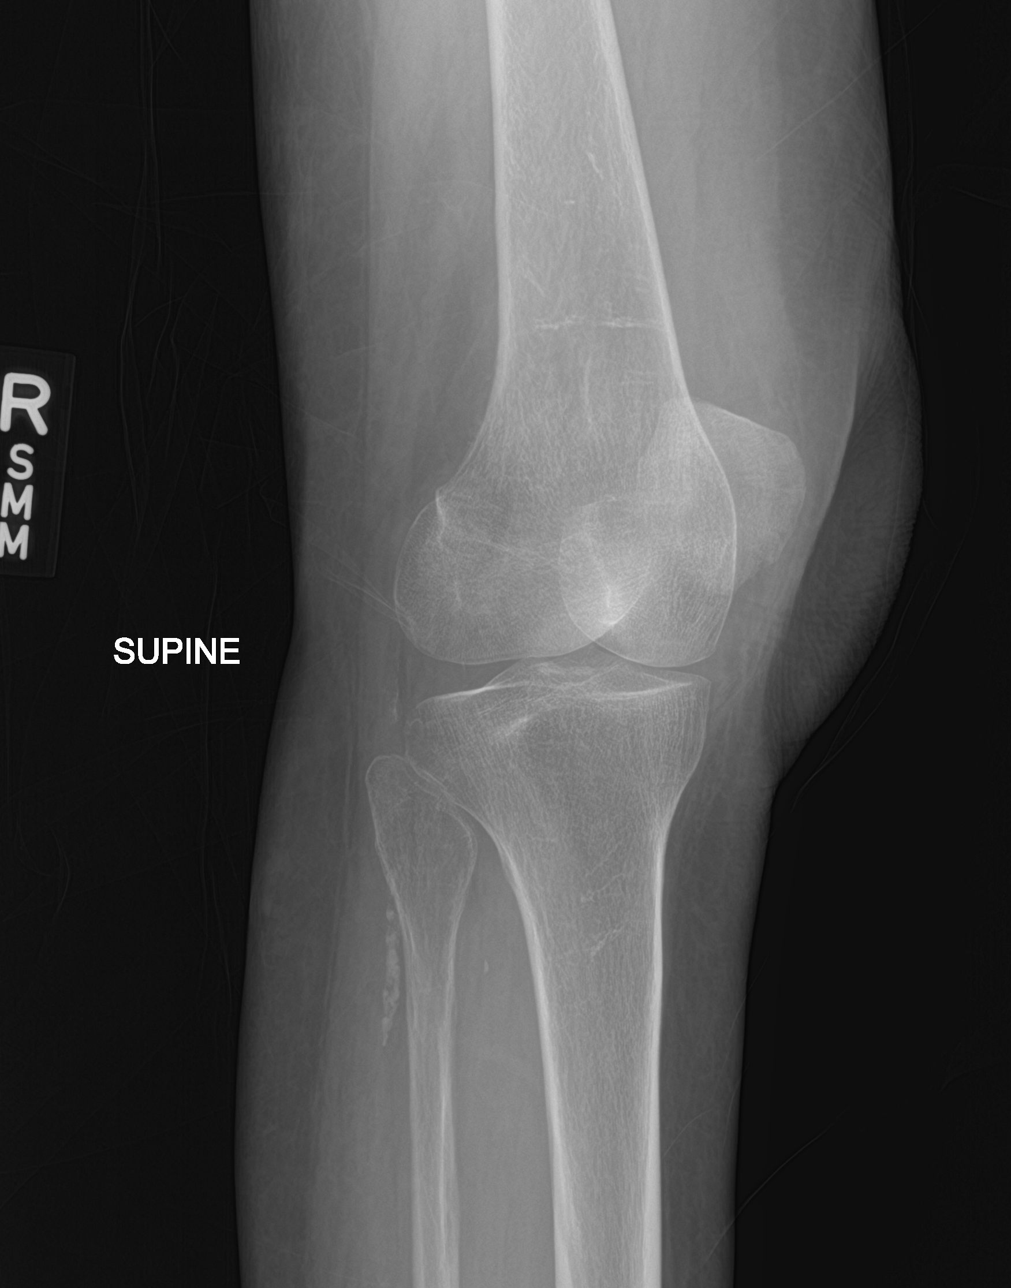

[knee obl (2 of 2)]
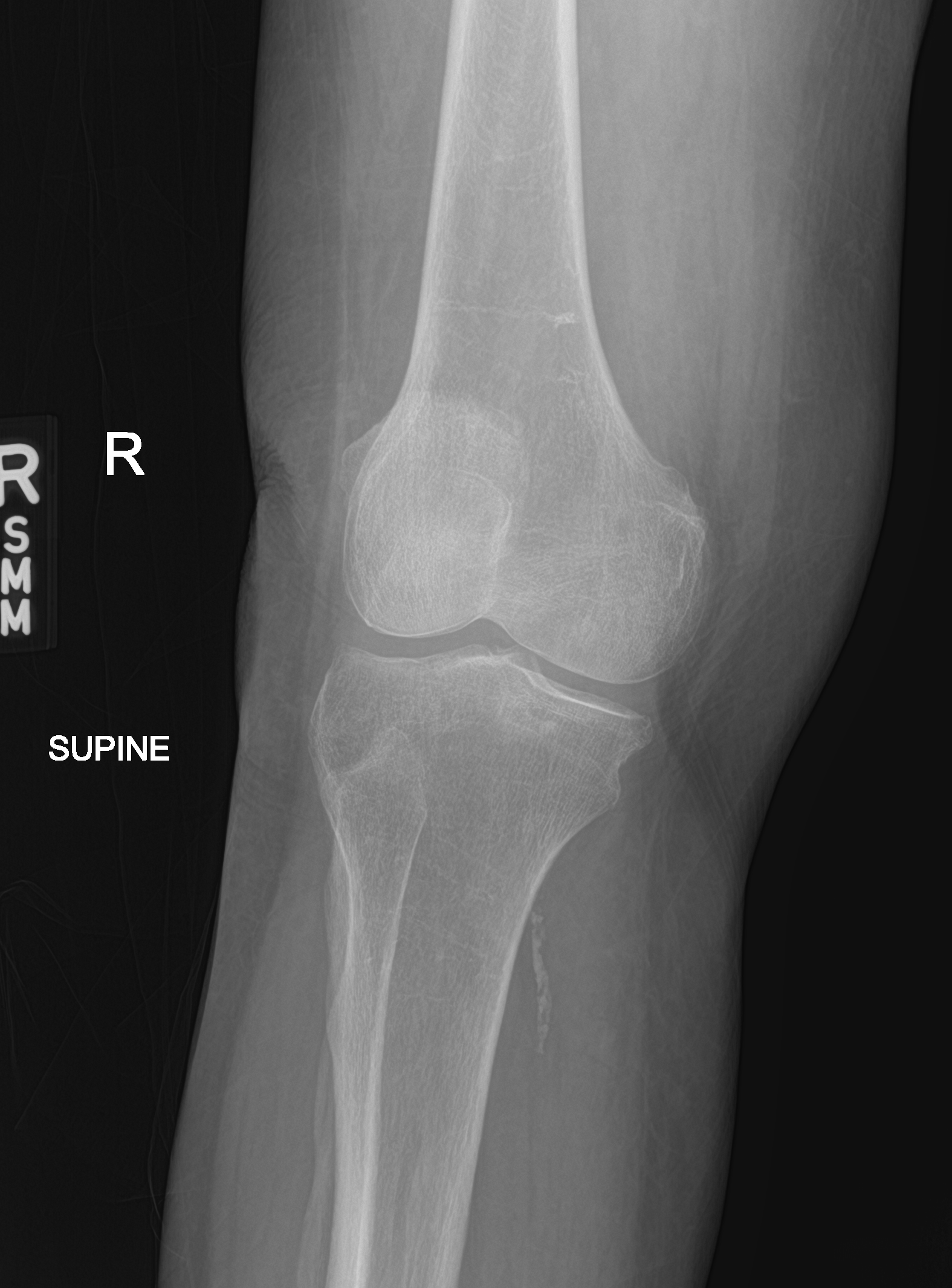

[knee lat]
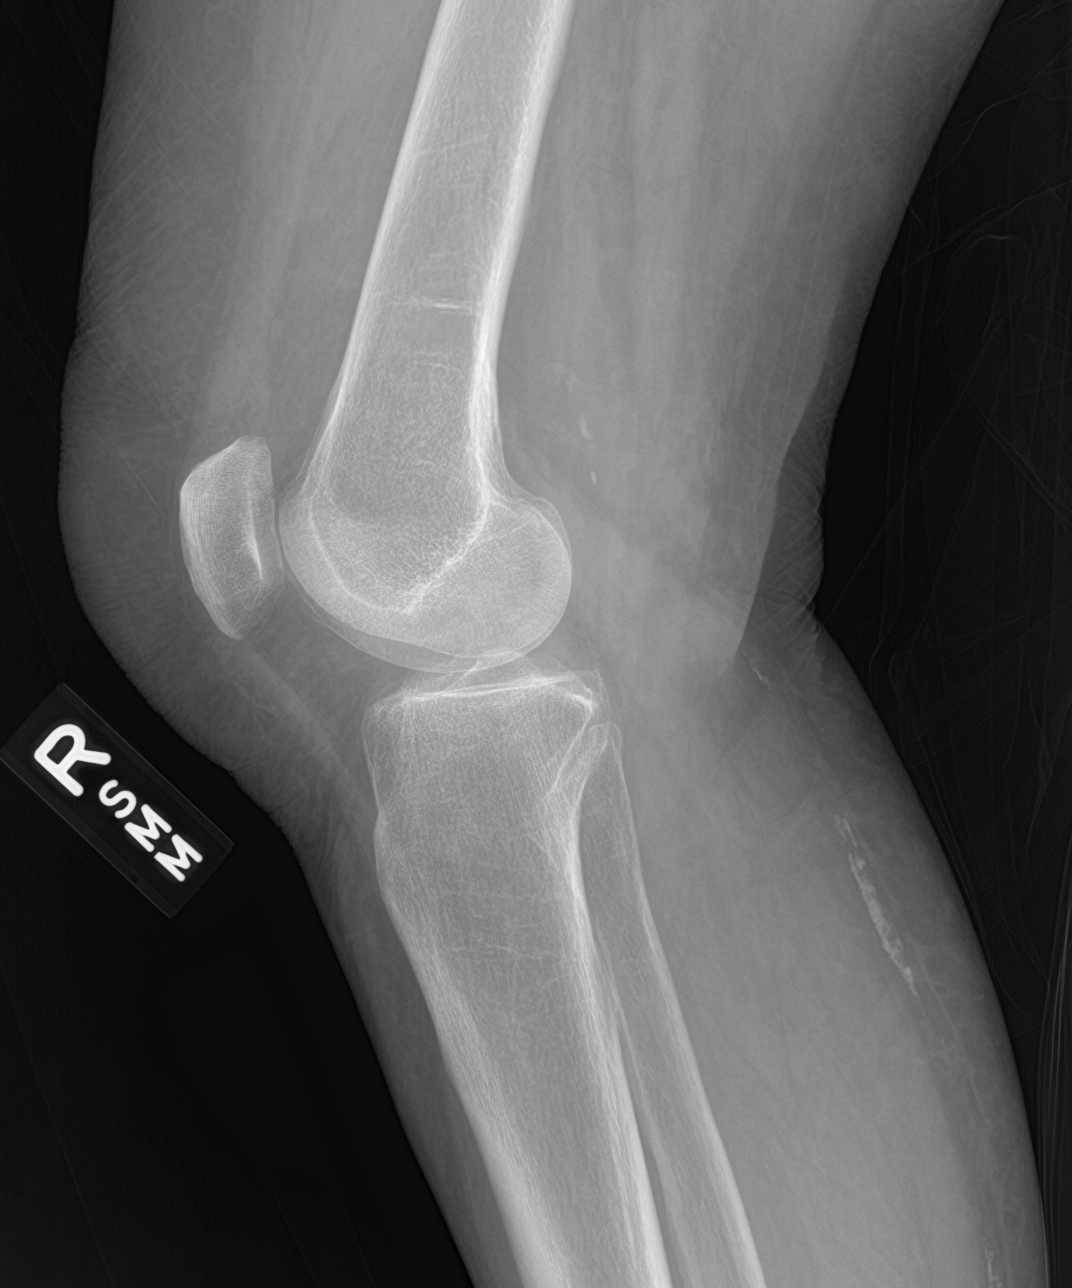

[4 of 4 positions shown; findings below may reference images not displayed]

FINDINGS: Frontal, lateral, and bilateral oblique views were obtained. There
is no fracture or dislocation. No joint effusion. There is
prominence of the prepatellar soft tissue. No increased opacity is
suggest hematoma seen in this area. There Is no appreciable joint
space narrowing. There are foci of popliteal artery atherosclerotic
calcification.
IMPRESSION: Mild soft tissue prominence in the prepatellar region of
questionable significance. No fracture or dislocation. No joint
effusion. Joint spaces appear unremarkable. There is popliteal
artery atherosclerosis.

## 2020-10-02 IMAGING — DX DG HIP (WITH OR WITHOUT PELVIS) 2-3V RIGHT
3 series · 3 of 3 positions shown · non-contrast
Comparison: None.

CLINICAL DATA: Pain

EXAM:
DG HIP (WITH OR WITHOUT PELVIS) 2-3V RIGHT

[pelvis ap]
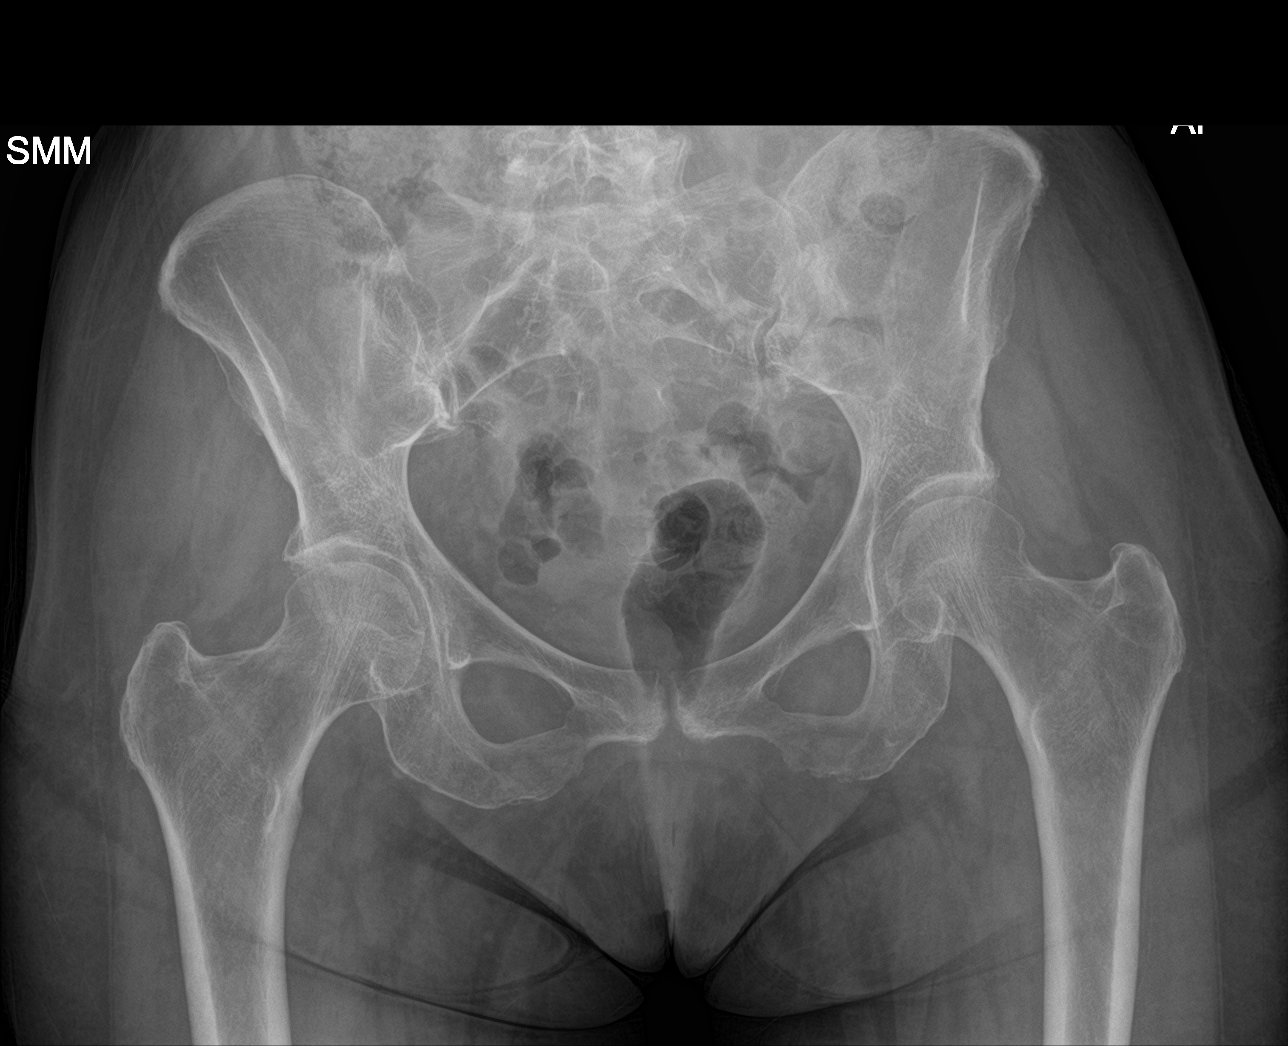

[hip ap]
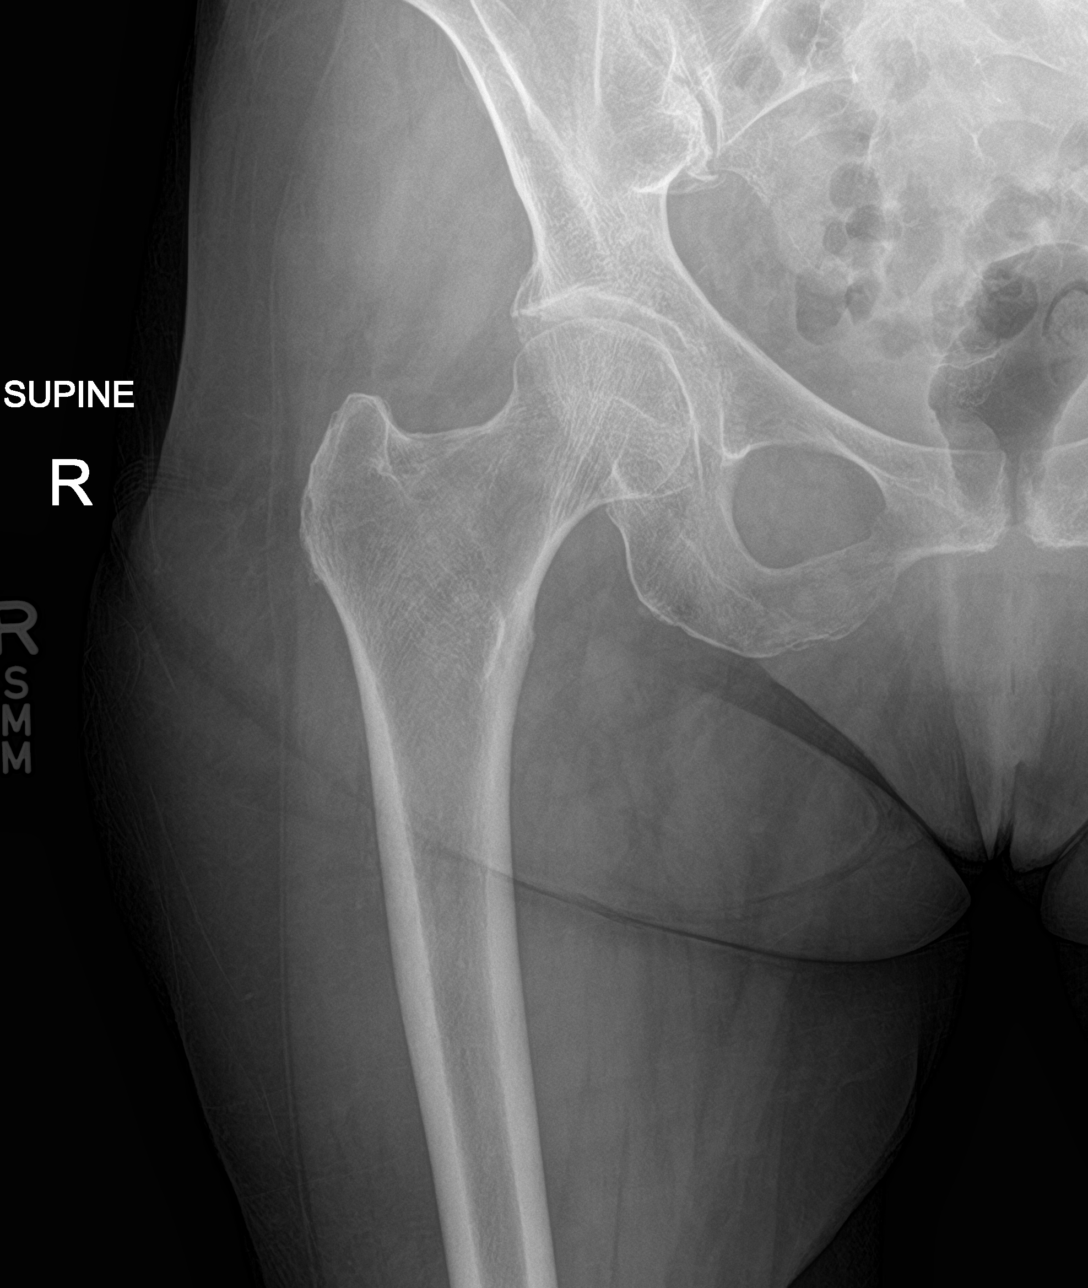

[hip lat]
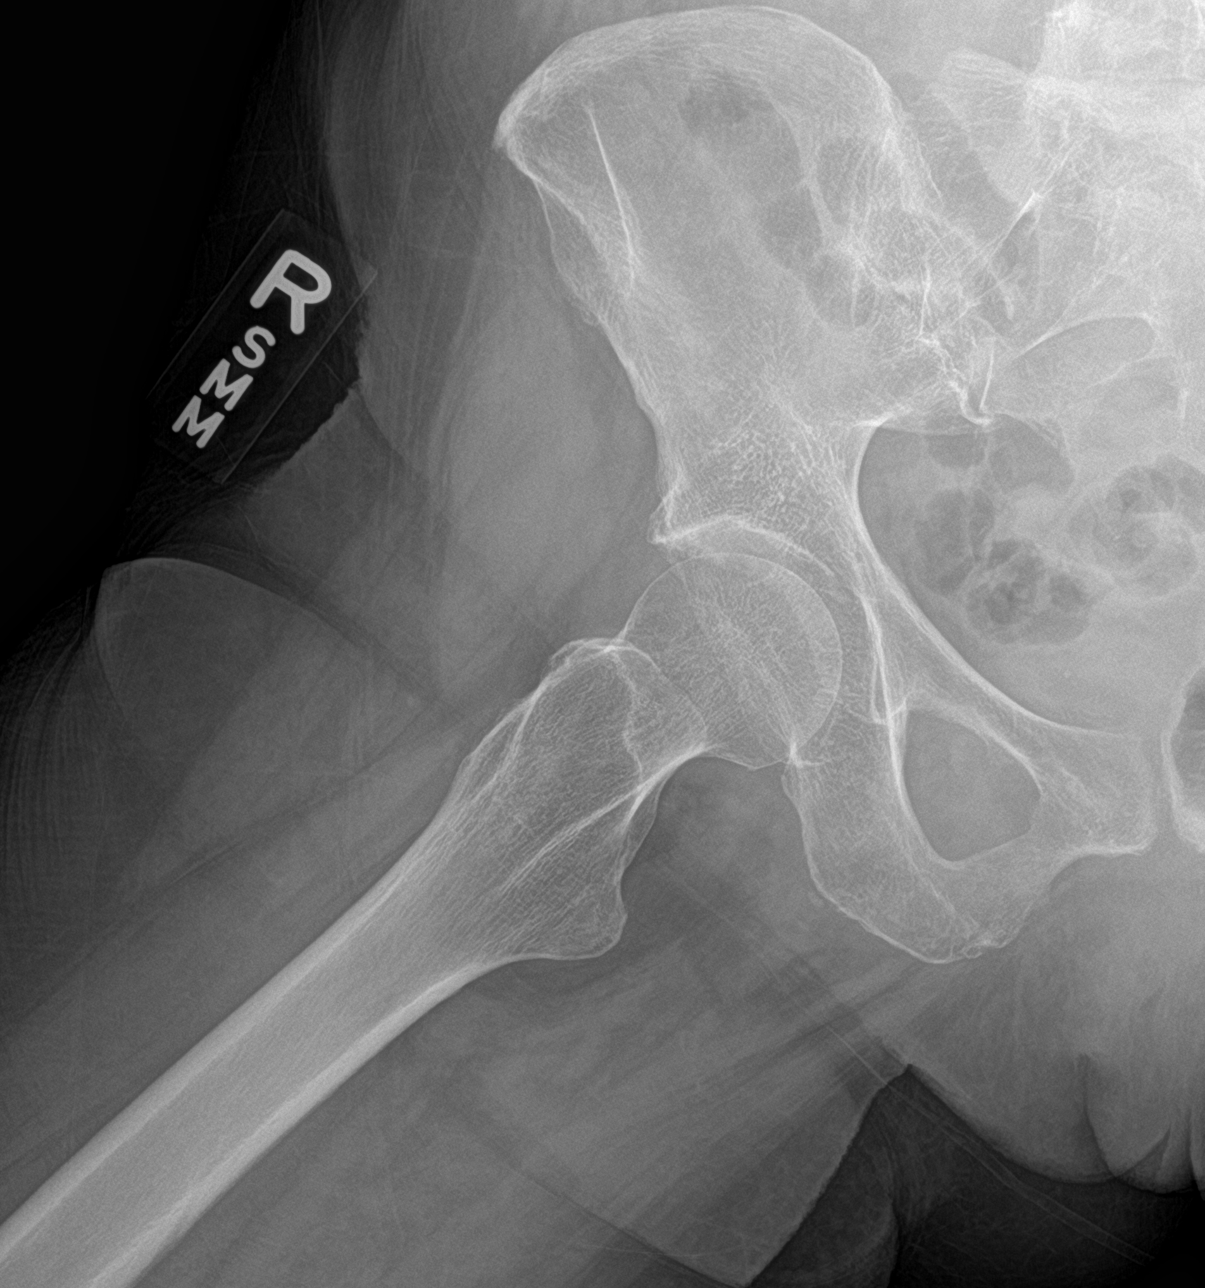

[3 of 3 positions shown; findings below may reference images not displayed]

FINDINGS: Frontal pelvis as well as frontal and lateral right hip images were
obtained. Bones appear somewhat osteoporotic. No fracture or
dislocation. Joint spaces appear normal. No erosive change.
IMPRESSION: Bones appear somewhat osteoporotic. No fracture or dislocation. No
evident arthropathy.

## 2020-10-03 ENCOUNTER — Telehealth: Payer: Self-pay | Admitting: Cardiovascular Disease

## 2020-10-03 NOTE — Telephone Encounter (Signed)
Called patient and her pharmacy. [facility] changed to 30 day supply since 90 DS would have cost ~$330. They changed to 30 day incase she was in the coverage gap. Would save her money to wait until Jan to fill the rest.  I called pt and advised that she should use the 30 tablets she has and in [place] call and make sur they fill for 90 days.

## 2020-10-03 NOTE — Telephone Encounter (Signed)
Pt c/o medication issue:  1. Name of Medication: Rivaroxaban (XARELTO) 15 MG TABS tablet  2. How are you currently taking this medication (dosage and times per day)? 1 tablet by mouth daily   3. Are you having a reaction (difficulty breathing--STAT)? No   4. What is your medication issue? Ashlee Warren is calling stating the pharmacy only gave her a 30 day supply when she really needs a 90 day due to having trouble finding rides. I advised Ashlee Warren it appears we requested a 90 day supply be filled, but that I would send a message for conformation so she could receive a callback. Please advise.

## 2020-11-08 DIAGNOSIS — N200 Calculus of kidney: Secondary | ICD-10-CM | POA: Diagnosis not present

## 2020-12-05 ENCOUNTER — Other Ambulatory Visit: Payer: Self-pay | Admitting: Family Medicine

## 2020-12-05 DIAGNOSIS — I4891 Unspecified atrial fibrillation: Secondary | ICD-10-CM

## 2020-12-06 ENCOUNTER — Encounter: Payer: Self-pay | Admitting: Family Medicine

## 2020-12-11 ENCOUNTER — Telehealth: Payer: Self-pay | Admitting: Family Medicine

## 2020-12-11 NOTE — Telephone Encounter (Signed)
Please see my chart message.  Is it possible to get this patient set up with [facility]?  Thanks.

## 2020-12-13 NOTE — Telephone Encounter (Signed)
I called over to [facility] and all they need is a referral sent over to them for the patient. They will review the case and contact the patient if they can accept them or will call us back to let us know they can not take them. Their fax number is [phone].

## 2020-12-14 NOTE — Telephone Encounter (Addendum)
See below.  Please talk to me about this.  What kind of referral specifically needs to be placed?  Please let me know.  Thanks.

## 2020-12-15 NOTE — Telephone Encounter (Signed)
Spoke with [facility] representative. No orders to be put in the chart. There is a referral form to be filled out. I am placing it on your desk [name] and then just fax it in with patient's demographic list, medication list and OV note if you have one. They will call us after reviewing information if not able to accept patient. Let me know if you have any other questions

## 2020-12-17 NOTE — Telephone Encounter (Signed)
I filled out the form.  Please scan.  Please send the extra information listed below along with the form.  Thanks.

## 2020-12-19 DIAGNOSIS — K4090 Unilateral inguinal hernia, without obstruction or gangrene, not specified as recurrent: Secondary | ICD-10-CM | POA: Diagnosis not present

## 2020-12-20 NOTE — Telephone Encounter (Signed)
Orders, OV note, Demographics and med list have been sent over to [facility]. Referral form sent to scan.

## 2021-01-12 NOTE — Progress Notes (Signed)
Patient ID: Ashlee Warren, female   DOB: [birth year], 87 y.o.   MRN: [id number]     87 y.o. chronic afib on Xarelto with no bleeding issues. Had [facility] to NSR 03/11/12 Flecainide stopped due to QRS widening On ETT. No history of CAD. EF has been normal  CHADVASC 4 Significant anxiety ans somatization  Carotid plaque with no stenosis duplex 07/24/17  Has chronic bifasicular block RBBB/LAFB no high grade AV block or syncope  Xarelto dose decreased 15 mg   Married 20 years  Alveta Warren Husband passed last year  she is trying to cope  They have 2 daughters in [place] and a son in [place]  She has had some pain in right hip from bursitis   Had COVID vaccine x2  Had cystoscopy with right ureteral stent 09/16/20 Hematuria resolved  Bothered by right inguinal hernia. Seen surgeon and not bad enough now to operate   ROS: Denies fever, malais, weight loss, blurry vision, decreased visual acuity, cough, sputum, SOB, hemoptysis, pleuritic pain, palpitaitons, heartburn, abdominal pain, melena, lower extremity edema, claudication, or rash.  All other systems reviewed and negative  General: BP (!) 150/70   Pulse 88   Ht 5\' 2"  (1.575 m)   Wt 53.1 kg   SpO2 98%   BMI 21.40 kg/m  Affect appropriate Healthy:  appears stated age 88: normal Neck supple with no adenopathy JVP normal no bruits no thyromegaly Lungs clear with no wheezing and good diaphragmatic motion Heart:  S1/S2 no murmur, no rub, gallop or click PMI normal Abdomen: benighn, BS positve, no tenderness, no AAA no bruit.  No HSM or HJR left inguinal hernia not bulging  Distal pulses intact with no bruits No edema Neuro non-focal Skin warm and dry No muscular weakness   Current Outpatient Medications  Medication Sig Dispense Refill  . diclofenac sodium (VOLTAREN) 1 % GEL Apply 2 g topically 4 (four) times daily. (Patient taking differently: Apply 2 g topically 4 (four) times daily as needed (pain).) 100 g 3  . diltiazem (CARDIZEM  CD) 180 MG 24 hr capsule TAKE 1 CAPSULE DAILY. PLEASE MAKE AN APPOINTMENT FOR PHYSICAL EXAM PRIOR TO FURTHER REFILLS. 90 capsule 1  . hydroxypropyl methylcellulose / hypromellose (ISOPTO TEARS / GONIOVISC) 2.5 % ophthalmic solution Place 1 drop into both eyes 3 (three) times daily as needed for dry eyes.    . Multiple Vitamins-Minerals (PRESERVISION AREDS 2+MULTI VIT PO) Take 1 capsule by mouth in the morning and at bedtime.     Ashlee Reasons 15 MG TABS tablet TAKE 1 TABLET (15 MG TOTAL) BY MOUTH DAILY WITH SUPPER. 90 tablet 1   No current facility-administered medications for this visit.    Allergies  Penicillins, Sulfonamide derivatives, and Tamsulosin   Electrocardiogram: 06/05/18 AFib rate 92 RBBB/LAFB no changes 12/14/19 afib rate 87 RBBB LAFB 01/20/2021 afib RBBB LAFB rate 88   Assessment and Plan  PAF:  CHADVASC 4 on xarelto  no bleeding issues  Rate control cardizem  HTN:  Well controlled.  Continue current medications and low sodium Dash type diet.   Carotid:  Some plaque no stenosis  Reviewed duplex from 07/24/17  Will update RBBB/LAFB:  Yearly ECG no high grade heart block or syncope stable  Depression: reactive to husbands death f/u [name]  Urology:   F/U Dr Abner Greenspan post cystoscopy with right ureteral stent no stone seen Held xarelto 2 days before without incident GI:  Right inguinal hernia. Not impressive on exam today but she indicates it can  bulge out and be painful She is clear to have surgery if needed Would need to hold xarelto for 48 hours prior   Carotid duplex F/U in a year  [employer]

## 2021-01-17 ENCOUNTER — Other Ambulatory Visit: Payer: Self-pay | Admitting: Cardiovascular Disease

## 2021-01-17 NOTE — Telephone Encounter (Signed)
Pt last last saw Dr Johnsie Cancel 07/15/20, last labs 09/06/20 Creat 1.02, age 87, weight 52.6kg, CrCl 30.44, based on CrCl pt is on appropriate dosage of Xarelto 15mg  QD.  Will refill rx.

## 2021-01-20 ENCOUNTER — Encounter: Payer: Self-pay | Admitting: Cardiovascular Disease

## 2021-01-20 ENCOUNTER — Ambulatory Visit (INDEPENDENT_AMBULATORY_CARE_PROVIDER_SITE_OTHER): Payer: Medicare Other | Admitting: Cardiovascular Disease

## 2021-01-20 ENCOUNTER — Other Ambulatory Visit: Payer: Self-pay

## 2021-01-20 VITALS — BP 150/70 | HR 88 | Ht 62.0 in | Wt 117.0 lb

## 2021-01-20 DIAGNOSIS — I482 Chronic atrial fibrillation, unspecified: Secondary | ICD-10-CM | POA: Diagnosis not present

## 2021-01-20 DIAGNOSIS — R0989 Other specified symptoms and signs involving the circulatory and respiratory systems: Secondary | ICD-10-CM | POA: Diagnosis not present

## 2021-01-20 NOTE — Patient Instructions (Addendum)
Medication Instructions:  *If you need a refill on your cardiac medications before your next appointment, please call your pharmacy*  Lab Work: If you have labs (blood work) drawn today and your tests are completely normal, you will receive your results only by: [place] [facility] MyChart Message (if you have MyChart) OR . A paper copy in the mail If you have any lab test that is abnormal or we need to change your treatment, we will call you to review the results.  Testing/Procedures: Your physician has requested that you have a carotid duplex. This test is an ultrasound of the carotid arteries in your neck. It looks at blood flow through these arteries that supply the brain with blood. Allow one hour for this exam. There are no restrictions or special instructions.  Follow-Up: At [facility], you and your health needs are our priority.  As part of our continuing mission to provide you with exceptional heart care, we have created designated Provider Care Teams.  These Care Teams include your primary Cardiologist (physician) and Advanced Practice Providers (APPs -  Physician Assistants and Nurse Practitioners) who all work together to provide you with the care you need, when you need it.  We recommend signing up for the patient portal called "MyChart".  Sign up information is provided on this After Visit Summary.  MyChart is used to connect with patients for Virtual Visits (Telemedicine).  Patients are able to view lab/test results, encounter notes, upcoming appointments, etc.  Non-urgent messages can be sent to your provider as well.   To learn more about what you can do with MyChart, go to [email or web address].    Your next appointment:   3 month(s)  The format for your next appointment:   In Person  Provider:   You may see Jenkins Rouge, MD or one of the following Advanced Practice Providers on your designated Care Team:    Kathyrn Drown, NP

## 2021-01-25 ENCOUNTER — Other Ambulatory Visit: Payer: Self-pay

## 2021-01-25 ENCOUNTER — Ambulatory Visit (HOSPITAL_COMMUNITY)
Admission: RE | Admit: 2021-01-25 | Discharge: 2021-01-25 | Disposition: A | Discharge status: Home / Self Care | Payer: Medicare Other | Source: Ambulatory Visit | Attending: Cardiology | Admitting: Cardiology

## 2021-01-25 DIAGNOSIS — R0989 Other specified symptoms and signs involving the circulatory and respiratory systems: Secondary | ICD-10-CM | POA: Insufficient documentation

## 2021-01-25 DIAGNOSIS — I482 Chronic atrial fibrillation, unspecified: Secondary | ICD-10-CM | POA: Diagnosis not present

## 2021-03-29 ENCOUNTER — Encounter: Payer: Self-pay | Admitting: Family Medicine

## 2021-03-30 NOTE — Telephone Encounter (Signed)
LInda (DPR signed) said pt started with dry cough about 2 wks ago. Now pt still has a dry cough but sounds wetter cough than before but cough still non productive; pt is not resting at night now and has not taken any OTC for cough.no fever,chills, body aches, H/A, SOB,diarrhea, vomiting, loss of taste or smell,S/T,runny nose and no chest, head or sinus congestion.pt has not been exposed to + covid virus; pt has no other symptoms except a dry hacky cough. Pt wants a [facility] referral for a nurse to come to her home and listen to her lungs. Vaughan Basta said has previously discussed pt getting [facility] nurses due to her age and not wanting to leave the house. Vaughan Basta said if cannot get [facility] referral is there an OTC or prescription med for cough that [name] would recommend. Vaughan Basta said pt does not want to schedule appt but if [name] feels necessary she will bring pt in for visit. [facility] [address] [facility] request cb after note reviewed by [name]. Sending note to [name] and Janett Billow CMA.

## 2021-03-31 ENCOUNTER — Other Ambulatory Visit: Payer: Self-pay | Admitting: Family Medicine

## 2021-03-31 ENCOUNTER — Encounter: Payer: Self-pay | Admitting: Family Medicine

## 2021-03-31 MED ORDER — BENZONATATE 200 MG PO CAPS: 200.0000 mg | ORAL_CAPSULE | Freq: Three times a day (TID) | ORAL | 1 refills | Status: DC | PRN

## 2021-03-31 NOTE — Telephone Encounter (Signed)
Please call patient/family back.  I don't think we have the ability to get home health set up as was prev done with her husband.  My understanding is the prev agency isn't working with the hospital system now- that was not my choice.    I would try plain mucinex first with plenty of water.  If needed, could try tessalon for cough.  I sent the rx.  If not better, would likely need OV.  Please update [place] as needed.  I hope she feels better soon.  Thanks.

## 2021-03-31 NOTE — Telephone Encounter (Signed)
See other mychart message. I sent message from Dr. Damita Dunnings below back to patients daughter.

## 2021-04-10 ENCOUNTER — Encounter: Payer: Self-pay | Admitting: Family Medicine

## 2021-04-20 ENCOUNTER — Other Ambulatory Visit: Payer: Self-pay

## 2021-04-20 ENCOUNTER — Encounter: Payer: Self-pay | Admitting: Family Medicine

## 2021-04-20 ENCOUNTER — Ambulatory Visit (INDEPENDENT_AMBULATORY_CARE_PROVIDER_SITE_OTHER): Payer: Medicare Other | Admitting: Family Medicine

## 2021-04-20 DIAGNOSIS — I4891 Unspecified atrial fibrillation: Secondary | ICD-10-CM | POA: Diagnosis not present

## 2021-04-20 DIAGNOSIS — H6121 Impacted cerumen, right ear: Secondary | ICD-10-CM

## 2021-04-20 MED ORDER — DICLOFENAC SODIUM 1 % EX GEL: 4.0000 g | Freq: Four times a day (QID) | CUTANEOUS | Status: DC | PRN

## 2021-04-20 MED ORDER — DILTIAZEM HCL ER COATED BEADS 120 MG PO CP24: 120.0000 mg | ORAL_CAPSULE | Freq: Every day | ORAL | 5 refills | Status: DC

## 2021-04-20 NOTE — Patient Instructions (Signed)
Change to lower dose of diltiazem and update me as needed.  Take care.  Glad to see you.

## 2021-04-20 NOTE — Progress Notes (Signed)
This visit occurred during the SARS-CoV-2 public health emergency.  Safety protocols were in place, including screening questions prior to the visit, additional usage of staff PPE, and extensive cleaning of exam room while observing appropriate contact time as indicated for disinfecting solutions.  F/u re: cough.  Cough is better in the meantime.  Prev with dry cough.  Took mucinex.  Didn't need to take tessalon.  She thought it was from allergies, or at least had allergies contributing.  Some rhinorrhea.  No fevers now.  Can still take a deep breath.    Had hearing aids.  She was concerned about wax build up.  Hearing is affected recently.  She thought B ears were affected.  No pain.    She had been get episodically lightheaded.  Nighttimes are hard for patient, with her husband having passed away.  D/w pt. condolences offered.  Meds, vitals, and allergies reviewed.   ROS: Per HPI unless specifically indicated in ROS section   GEN: nad, alert and oriented HEENT: ncat, right cerumen impaction noted.  Minimal wax in the left ear canal.  Right impaction resolved with irrigation and tympanic membrane recheck normal.  Left TM normal. NECK: supple w/o LA CV: IRR but not tachycardic PULM: ctab, no inc wob ABD: soft, +bs EXT: no edema SKIN: Well-perfused.  30 minutes were devoted to patient care in this encounter (this includes time spent reviewing the patient's file/history, interviewing and examining the patient, counseling/reviewing plan with patient).

## 2021-04-23 DIAGNOSIS — H6121 Impacted cerumen, right ear: Secondary | ICD-10-CM | POA: Insufficient documentation

## 2021-04-23 NOTE — Assessment & Plan Note (Signed)
With episodic lightheadedness.  She has weight loss that was likely exacerbated by grief at the loss of her husband.  Discussed options.  I suspect she has now on higher than necessary dose of diltiazem, given her weight loss.  Decrease to 120 mg daily with routine cautions given to patient she will update me as needed.  She agrees with plan.  Okay for outpatient follow-up.

## 2021-04-23 NOTE — Assessment & Plan Note (Signed)
Resolved with irrigation.  She felt better afterwards.  Recheck tympanic membrane normal.

## 2021-04-27 ENCOUNTER — Encounter: Payer: Self-pay | Admitting: Family Medicine

## 2021-04-27 DIAGNOSIS — D0472 Carcinoma in situ of skin of left lower limb, including hip: Secondary | ICD-10-CM | POA: Diagnosis not present

## 2021-04-27 DIAGNOSIS — L570 Actinic keratosis: Secondary | ICD-10-CM | POA: Diagnosis not present

## 2021-04-27 DIAGNOSIS — D0471 Carcinoma in situ of skin of right lower limb, including hip: Secondary | ICD-10-CM | POA: Diagnosis not present

## 2021-04-27 DIAGNOSIS — X32XXXD Exposure to sunlight, subsequent encounter: Secondary | ICD-10-CM | POA: Diagnosis not present

## 2021-05-19 NOTE — Progress Notes (Signed)
Patient ID: Ashlee Warren, female   DOB: [birth year], 87 y.o.   MRN: [id number]     87 y.o. chronic afib on Xarelto with no bleeding issues. Had [facility] to NSR 03/11/12 Flecainide stopped due to QRS widening On ETT. No history of CAD. EF has been normal  CHADVASC 4 Significant anxiety ans somatization  Carotid plaque with no stenosis duplex 07/24/17  Has chronic bifasicular block RBBB/LAFB no high grade AV block or syncope  Xarelto dose decreased 15 mg   Married 45 years  Ashlee Warren Husband passed last year  she is trying to cope  They have 2 daughters in [place] and a son in [place]  She has had some pain in right hip from bursitis   Had COVID vaccine x2  Had cystoscopy with right ureteral stent 09/16/20 Hematuria resolved  Bothered by right inguinal hernia. Seen surgeon and not bad enough now to operate   Seen by primary Phillip Heal 04/20/21 hearing aids, wax removed Some dizziness at night with weight loss and cardizem dose decreased   Seen by [facility] and she may opt to get her hernia surgery   Daughter  Vaughan Basta going to [place] with a [employer] friend to help with myeloma Rx at [place]: Denies fever, malais, weight loss, blurry vision, decreased visual acuity, cough, sputum, SOB, hemoptysis, pleuritic pain, palpitaitons, heartburn, abdominal pain, melena, lower extremity edema, claudication, or rash.  All other systems reviewed and negative  General: There were no vitals taken for this visit. Affect appropriate Healthy:  appears stated age 87: normal Neck supple with no adenopathy JVP normal no bruits no thyromegaly Lungs clear with no wheezing and good diaphragmatic motion Heart:  S1/S2 no murmur, no rub, gallop or click PMI normal Abdomen: benighn, BS positve, no tenderness, no AAA no bruit.  No HSM or HJR left inguinal hernia not bulging  Distal pulses intact with no bruits No edema Neuro non-focal Skin warm and dry No muscular weakness   Current Outpatient  Medications  Medication Sig Dispense Refill   diclofenac Sodium (VOLTAREN) 1 % GEL Apply 4 g topically 4 (four) times daily as needed.     diltiazem (CARDIZEM CD) 120 MG 24 hr capsule Take 1 capsule (120 mg total) by mouth daily. 30 capsule 5   Multiple Vitamins-Minerals (PRESERVISION AREDS 2+MULTI VIT PO) Take 1 capsule by mouth in the morning and at bedtime.      XARELTO 15 MG TABS tablet TAKE 1 TABLET (15 MG TOTAL) BY MOUTH DAILY WITH SUPPER. 90 tablet 1   No current facility-administered medications for this visit.    Allergies  Penicillins, Sulfonamide derivatives, and Tamsulosin   Electrocardiogram: 06/05/18 AFib rate 92 RBBB/LAFB no changes 12/14/19 afib rate 87 RBBB LAFB 05/19/2021 afib RBBB LAFB rate 88   Assessment and Plan  PAF:  CHADVASC 4 on xarelto  no bleeding issues  Rate control cardizem dose decreased 04/23/21  HTN:  Well controlled.  Continue current medications and low sodium Dash type diet.   Carotid:  Some plaque no stenosis  duplex 01/25/21  RBBB/LAFB:  Yearly ECG no high grade heart block or syncope stable  Depression: reactive to husbands death f/u [name]  Urology:   F/U Dr Abner Greenspan post cystoscopy with right ureteral stent no stone seen Held xarelto 2 days before without incident GI:  Right inguinal hernia. Not impressive on exam today but she indicates it can bulge out and be painful She is clear to have surgery if needed Would need to hold  xarelto for 48 hours prior   Labs today  F/U in a  6 months   Jenkins Rouge

## 2021-05-24 ENCOUNTER — Ambulatory Visit (INDEPENDENT_AMBULATORY_CARE_PROVIDER_SITE_OTHER): Payer: Medicare Other | Admitting: Cardiovascular Disease

## 2021-05-24 ENCOUNTER — Encounter: Payer: Self-pay | Admitting: Cardiovascular Disease

## 2021-05-24 ENCOUNTER — Other Ambulatory Visit: Payer: Self-pay

## 2021-05-24 VITALS — BP 112/68 | HR 90 | Ht 62.0 in | Wt 113.8 lb

## 2021-05-24 DIAGNOSIS — R0989 Other specified symptoms and signs involving the circulatory and respiratory systems: Secondary | ICD-10-CM | POA: Diagnosis not present

## 2021-05-24 DIAGNOSIS — I480 Paroxysmal atrial fibrillation: Secondary | ICD-10-CM | POA: Diagnosis not present

## 2021-05-24 DIAGNOSIS — K4090 Unilateral inguinal hernia, without obstruction or gangrene, not specified as recurrent: Secondary | ICD-10-CM

## 2021-05-24 DIAGNOSIS — Z7901 Long term (current) use of anticoagulants: Secondary | ICD-10-CM

## 2021-05-24 DIAGNOSIS — Z5181 Encounter for therapeutic drug level monitoring: Secondary | ICD-10-CM | POA: Diagnosis not present

## 2021-05-24 DIAGNOSIS — I482 Chronic atrial fibrillation, unspecified: Secondary | ICD-10-CM

## 2021-05-24 LAB — COMPREHENSIVE METABOLIC PANEL
ALT: 15 IU/L (ref 0–32)
AST: 17 IU/L (ref 0–40)
Albumin/Globulin Ratio: 1.7 (ref 1.2–2.2)
Albumin: 3.9 g/dL (ref 3.5–4.6)
Alkaline Phosphatase: 108 IU/L (ref 44–121)
BUN/Creatinine Ratio: 19 (ref 12–28)
BUN: 20 mg/dL (ref 10–36)
Bilirubin Total: 0.4 mg/dL (ref 0.0–1.2)
CO2: 24 mmol/L (ref 20–29)
Calcium: 9.5 mg/dL (ref 8.7–10.3)
Chloride: 105 mmol/L (ref 96–106)
Creatinine, Ser: 1.06 mg/dL — ABNORMAL HIGH (ref 0.57–1.00)
Globulin, Total: 2.3 g/dL (ref 1.5–4.5)
Glucose: 90 mg/dL (ref 65–99)
Potassium: 3.9 mmol/L (ref 3.5–5.2)
Sodium: 146 mmol/L — ABNORMAL HIGH (ref 134–144)
Total Protein: 6.2 g/dL (ref 6.0–8.5)
eGFR: 50 mL/min/{1.73_m2} — ABNORMAL LOW (ref >59)

## 2021-05-24 LAB — TSH: TSH: 1.84 u[IU]/mL (ref 0.450–4.500)

## 2021-05-24 LAB — CBC WITH DIFFERENTIAL/PLATELET
Basophils Absolute: 0 10*3/uL (ref 0.0–0.2)
Basos: 0 %
EOS (ABSOLUTE): 0.1 10*3/uL (ref 0.0–0.4)
Eos: 1 %
Hematocrit: 38.2 % (ref 34.0–46.6)
Hemoglobin: 12.7 g/dL (ref 11.1–15.9)
Immature Grans (Abs): 0 10*3/uL (ref 0.0–0.1)
Immature Granulocytes: 0 %
Lymphocytes Absolute: 1.8 10*3/uL (ref 0.7–3.1)
Lymphs: 21 %
MCH: 27.9 pg (ref 26.6–33.0)
MCHC: 33.2 g/dL (ref 31.5–35.7)
MCV: 84 fL (ref 79–97)
Monocytes Absolute: 0.9 10*3/uL (ref 0.1–0.9)
Monocytes: 10 %
Neutrophils Absolute: 5.8 10*3/uL (ref 1.4–7.0)
Neutrophils: 68 %
Platelets: 297 10*3/uL (ref 150–450)
RBC: 4.56 x10E6/uL (ref 3.77–5.28)
RDW: 13.3 % (ref 11.7–15.4)
WBC: 8.6 10*3/uL (ref 3.4–10.8)

## 2021-05-24 NOTE — Patient Instructions (Addendum)
Medication Instructions:  *If you need a refill on your cardiac medications before your next appointment, please call your pharmacy*  Lab Work: Your physician recommends that you have lab work today- BMET, CBC, and TSH  If you have labs (blood work) drawn today and your tests are completely normal, you will receive your results only by: [place] (if you have MyChart) OR A paper copy in the mail If you have any lab test that is abnormal or we need to change your treatment, we will call you to review the results.  Testing/Procedures: None ordered today.  Follow-Up: At [facility], you and your health needs are our priority.  As part of our continuing mission to provide you with exceptional heart care, we have created designated Provider Care Teams.  These Care Teams include your primary Cardiologist (physician) and Advanced Practice Providers (APPs -  Physician Assistants and Nurse Practitioners) who all work together to provide you with the care you need, when you need it.  We recommend signing up for the patient portal called "MyChart".  Sign up information is provided on this After Visit Summary.  MyChart is used to connect with patients for Virtual Visits (Telemedicine).  Patients are able to view lab/test results, encounter notes, upcoming appointments, etc.  Non-urgent messages can be sent to your provider as well.   To learn more about what you can do with MyChart, go to [email or web address].    Your next appointment:   6 month(s)  The format for your next appointment:   In Person  Provider:   You may see Jenkins Rouge, MD or one of the following Advanced Practice Providers on your designated Care Team:   Cecilie Kicks, NP

## 2021-05-28 ENCOUNTER — Other Ambulatory Visit: Payer: Self-pay | Admitting: Family Medicine

## 2021-05-28 DIAGNOSIS — I4891 Unspecified atrial fibrillation: Secondary | ICD-10-CM

## 2021-06-02 DIAGNOSIS — L570 Actinic keratosis: Secondary | ICD-10-CM | POA: Diagnosis not present

## 2021-06-02 DIAGNOSIS — D2272 Melanocytic nevi of left lower limb, including hip: Secondary | ICD-10-CM | POA: Diagnosis not present

## 2021-06-02 DIAGNOSIS — Z08 Encounter for follow-up examination after completed treatment for malignant neoplasm: Secondary | ICD-10-CM | POA: Diagnosis not present

## 2021-06-02 DIAGNOSIS — L821 Other seborrheic keratosis: Secondary | ICD-10-CM | POA: Diagnosis not present

## 2021-06-02 DIAGNOSIS — Z85828 Personal history of other malignant neoplasm of skin: Secondary | ICD-10-CM | POA: Diagnosis not present

## 2021-06-02 DIAGNOSIS — X32XXXD Exposure to sunlight, subsequent encounter: Secondary | ICD-10-CM | POA: Diagnosis not present

## 2021-06-02 DIAGNOSIS — D485 Neoplasm of uncertain behavior of skin: Secondary | ICD-10-CM | POA: Diagnosis not present

## 2021-06-14 ENCOUNTER — Telehealth: Payer: Self-pay | Admitting: Cardiovascular Disease

## 2021-06-14 NOTE — Telephone Encounter (Signed)
Pt c/o medication issue:  1. Name of Medication:  XARELTO 15 MG TABS tablet  2. How are you currently taking this medication (dosage and times per day)?  As prescribed  3. Are you having a reaction (difficulty breathing--STAT)?  No  4. What is your medication issue?   Melissa with [facility] would like to know if the patient can take Flexeril while taking Xarelto. She wants to make sure this won't cause a reaction. She states she also wants to start the patient on a regimen of over the counter Aleve (Tylenol if patient is unable to take Aleve).  Please return call to discuss at [phone].

## 2021-06-14 NOTE — Telephone Encounter (Signed)
Called Melissa with [facility] and informed her of PharmD's recommendations. She verbalized understanding and will advise patient.

## 2021-06-14 NOTE — Telephone Encounter (Signed)
Ok to use Flexeril. Would recommend Tylenol over Aleve though to help minimize bleeding risk since pt is on Xarelto.

## 2021-07-11 ENCOUNTER — Telehealth: Payer: Self-pay | Admitting: Cardiovascular Disease

## 2021-07-11 NOTE — Telephone Encounter (Signed)
5-10 days of Aleve, 1-2 tablets a day. Can just do 1 a day if that is best and can also do every other if this is helpful.

## 2021-07-11 NOTE — Telephone Encounter (Signed)
Pt c/o medication issue:  1. Name of Medication:  Eliquis  2. How are you currently taking this medication (dosage and times per day)?   3. Are you having a reaction (difficulty breathing--STAT)?   4. What is your medication issue?   Michael with Dr. Dennard Nip office is following up. 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 The patient has been taking Tylenol, but it isn't helping (see 08/31 phone note). Legrand Como states Dr. [facility] is recommending an aleve regimen. However, the patient informed Dr. [facility] that she takes Eliquis. Dr. [facility] would like to know if the patient can come off of Eliquis, and if so then how long? Will this make the patient high risk? Please advise.

## 2021-07-11 NOTE — Telephone Encounter (Signed)
Pt takes Xarelto 15mg  daily, dosed appropriately for afib indication based on CrCl of 28. Her CHADS2VASc score is 19 (age x2, gender, HTN, CAD). Need to specify how long of a course of Aleve they are recommending. Ok to use for short period of time, but can increase bleeding risk. Do not recommend stopping Xarelto for an extended period of time so she can take an NSAID.

## 2021-07-11 NOTE — Telephone Encounter (Signed)
  Dr. Malcolm Metro requesting a callback from [name] regarding pt.

## 2021-07-12 NOTE — Telephone Encounter (Signed)
Jane Canary w/ Dr. Dennard Nip office, informed of recommendation below: Ok to use 1 tab of Aleve for 5 days, she should take with food. She should continue her Xarelto. Should be on the lookout for signs of GI bleeding (bright red or dark black stool).  Legrand Como appreciates the call. Will fax this phone note to their office as requested. Faxed to [phone]

## 2021-07-12 NOTE — Telephone Encounter (Signed)
Ok to use 1 tab of Aleve for 5 days, she should take with food. She should continue her Xarelto. Should be on the lookout for signs of GI bleeding (bright red or dark black stool).

## 2021-07-12 NOTE — Telephone Encounter (Signed)
Ashlee Warren with Dr. Dennard Nip office is following up, requesting our recommendation. Please return call to discuss.

## 2021-07-14 DIAGNOSIS — D485 Neoplasm of uncertain behavior of skin: Secondary | ICD-10-CM | POA: Diagnosis not present

## 2021-07-14 DIAGNOSIS — Z08 Encounter for follow-up examination after completed treatment for malignant neoplasm: Secondary | ICD-10-CM | POA: Diagnosis not present

## 2021-07-14 DIAGNOSIS — L570 Actinic keratosis: Secondary | ICD-10-CM | POA: Diagnosis not present

## 2021-07-14 DIAGNOSIS — X32XXXD Exposure to sunlight, subsequent encounter: Secondary | ICD-10-CM | POA: Diagnosis not present

## 2021-07-14 DIAGNOSIS — Z85828 Personal history of other malignant neoplasm of skin: Secondary | ICD-10-CM | POA: Diagnosis not present

## 2021-07-18 ENCOUNTER — Ambulatory Visit: Payer: Medicare Other | Admitting: Family Medicine

## 2021-07-18 ENCOUNTER — Telehealth: Payer: Self-pay | Admitting: Cardiovascular Disease

## 2021-07-18 NOTE — Telephone Encounter (Signed)
Pt c/o BP issue: STAT if pt c/o blurred vision, one-sided weakness or slurred speech  1. What are your last 5 BP readings? Yesterday  173/59 pulse 42, 182/48 pulse 43, 190/54 pulse 43 156/52  today it is 218/64,  218/72 pulse 38l  2. Are you having any other symptoms (ex. Dizziness, headache, blurred vision, passed out)?  Very anxious  3. What is your BP issue?  Top number for blood pressure is high and bottom number is low

## 2021-07-18 NOTE — Telephone Encounter (Signed)
Advised patient to change her batteries in her blood pressure machine. Instructed patient and neighbor on placement of cuff. Patient is getting an error reading. Next BP 215/61. Got second machine from neighbor and all the reading says is "lo", this means the batteries are low. Advised patient to change batteries for the 2nd BP cuff. Error reading just says "lo" at the bottom again. Patient is alert and oriented, no signs of distress, just anxious about the machine. Got verbal consent from Ileigh to call Quillian Quince. Spoke to Quillian Quince and advised that I am not convinced that the placement of the cuff is good to get an accurate reading and/or the batteries are low. Quillian Quince discussed going to the patients house and helping with placement of cuff and or getting new machine up the street from her house. Advised to bring machine to PCP on Friday and have them instruct on use and check out accuracy. ED precautions given.

## 2021-07-20 ENCOUNTER — Ambulatory Visit: Payer: Medicare Other

## 2021-07-21 ENCOUNTER — Ambulatory Visit (INDEPENDENT_AMBULATORY_CARE_PROVIDER_SITE_OTHER): Payer: Medicare Other | Admitting: Family Medicine

## 2021-07-21 ENCOUNTER — Encounter: Payer: Self-pay | Admitting: Family Medicine

## 2021-07-21 ENCOUNTER — Other Ambulatory Visit: Payer: Self-pay

## 2021-07-21 VITALS — BP 104/62 | HR 42 | Temp 97.9°F | Ht 62.0 in | Wt 112.0 lb

## 2021-07-21 DIAGNOSIS — R001 Bradycardia, unspecified: Secondary | ICD-10-CM | POA: Diagnosis not present

## 2021-07-21 DIAGNOSIS — I4891 Unspecified atrial fibrillation: Secondary | ICD-10-CM

## 2021-07-21 LAB — COMPREHENSIVE METABOLIC PANEL
ALT: 28 U/L (ref 0–35)
AST: 22 U/L (ref 0–37)
Albumin: 3.7 g/dL (ref 3.5–5.2)
Alkaline Phosphatase: 83 U/L (ref 39–117)
BUN: 24 mg/dL — ABNORMAL HIGH (ref 6–23)
CO2: 30 mEq/L (ref 19–32)
Calcium: 9.5 mg/dL (ref 8.4–10.5)
Chloride: 107 mEq/L (ref 96–112)
Creatinine, Ser: 1.01 mg/dL (ref 0.40–1.20)
GFR: 48.72 mL/min — ABNORMAL LOW (ref >60.00)
Glucose, Bld: 93 mg/dL (ref 70–99)
Potassium: 3.8 mEq/L (ref 3.5–5.1)
Sodium: 143 mEq/L (ref 135–145)
Total Bilirubin: 0.7 mg/dL (ref 0.2–1.2)
Total Protein: 6.1 g/dL (ref 6.0–8.3)

## 2021-07-21 LAB — CBC WITH DIFFERENTIAL/PLATELET
Basophils Absolute: 0 10*3/uL (ref 0.0–0.1)
Basophils Relative: 0.4 % (ref 0.0–3.0)
Eosinophils Absolute: 0.1 10*3/uL (ref 0.0–0.7)
Eosinophils Relative: 1.2 % (ref 0.0–5.0)
HCT: 38.7 % (ref 36.0–46.0)
Hemoglobin: 12.5 g/dL (ref 12.0–15.0)
Lymphocytes Relative: 17 % (ref 12.0–46.0)
Lymphs Abs: 1.4 10*3/uL (ref 0.7–4.0)
MCHC: 32.1 g/dL (ref 30.0–36.0)
MCV: 84.3 fl (ref 78.0–100.0)
Monocytes Absolute: 0.8 10*3/uL (ref 0.1–1.0)
Monocytes Relative: 10.1 % (ref 3.0–12.0)
Neutro Abs: 5.8 10*3/uL (ref 1.4–7.7)
Neutrophils Relative %: 71.3 % (ref 43.0–77.0)
Platelets: 274 10*3/uL (ref 150.0–400.0)
RBC: 4.59 Mil/uL (ref 3.87–5.11)
RDW: 14.9 % (ref 11.5–15.5)
WBC: 8.2 10*3/uL (ref 4.0–10.5)

## 2021-07-21 LAB — TSH: TSH: 2.04 u[IU]/mL (ref 0.35–5.50)

## 2021-07-21 MED ORDER — DILTIAZEM HCL 30 MG PO TABS: 30.0000 mg | ORAL_TABLET | Freq: Two times a day (BID) | ORAL | 3 refills | Status: DC

## 2021-07-21 MED ORDER — RIVAROXABAN 15 MG PO TABS
ORAL_TABLET | ORAL | 1 refills | Status: DC
Start: 2021-07-21 — End: 2022-01-31

## 2021-07-21 NOTE — Progress Notes (Signed)
This visit occurred during the SARS-CoV-2 public health emergency.  Safety protocols were in place, including screening questions prior to the visit, additional usage of staff PPE, and extensive cleaning of exam room while observing appropriate contact time as indicated for disinfecting solutions.  Confusion and anxiety noted.  "I've slowed down a whole lot."  Bradycardia.  Recheck pulse 42.  This is similar to prev recently readings at home.  Unclear when this started; pulse was higher at prev visits earlier in 2022.  Dec in appetite noted.  She had an episode when she couldn't find her husband and had forgotten that he had died.  Her neighbor reassured her.    Family noted that she was taking more notes to reassure herself re: memory.    Jaw symptoms d/w pt. She had 2 hours of dental work done. Sx started after that.  She is better but not back to baseline.  She can eat and brush her teeth but she has dec in ROM compared to baseline.    Meds, vitals, and allergies reviewed.   ROS: Per HPI unless specifically indicated in ROS section   Nad N[place] Neck supple, no LA She does not have full range of motion at TMJ but it is about 50% of opening and this is improved/improving per patient report. Regular but brady.   Ctab Skin well perfused. Extremities without edema  35 minutes were devoted to patient care in this encounter (this includes time spent reviewing the patient's file/history, interviewing and examining the patient, counseling/reviewing plan with patient).

## 2021-07-21 NOTE — Patient Instructions (Signed)
Stop 120mg  diltiazem daily and change to 30mg  twice a day.  Start the change tomorrow.   Update me about your BP and pulse in a few days.   Go to the lab on the way out.   If you have mychart we'll likely use that to update you.    Take care.  Glad to see you.

## 2021-07-23 DIAGNOSIS — R001 Bradycardia, unspecified: Secondary | ICD-10-CM | POA: Insufficient documentation

## 2021-07-23 NOTE — Assessment & Plan Note (Signed)
She has bradycardia and relatively low blood pressure and I question how much of that is contributing to her mood changes.  Discussed decreasing diltiazem down to 30 mg twice a day.  I will update cardiology in the meantime.  She can update me about how she is feeling in the near future.  See notes on labs.  It appears that her jaw symptoms are completely separate and related to prolonged opening for dental work and this is improving.

## 2021-07-28 ENCOUNTER — Other Ambulatory Visit: Payer: Self-pay | Admitting: Family Medicine

## 2021-07-28 ENCOUNTER — Encounter: Payer: Self-pay | Admitting: Family Medicine

## 2021-07-28 DIAGNOSIS — Z23 Encounter for immunization: Secondary | ICD-10-CM | POA: Diagnosis not present

## 2021-07-28 MED ORDER — DILTIAZEM HCL 30 MG PO TABS
ORAL_TABLET | ORAL | Status: DC
Start: 2021-07-28 — End: 2021-08-12

## 2021-08-09 ENCOUNTER — Encounter: Payer: Self-pay | Admitting: Family Medicine

## 2021-08-10 ENCOUNTER — Inpatient Hospital Stay (HOSPITAL_COMMUNITY)
Admission: EM | Admit: 2021-08-10 | Discharge: 2021-08-12 | DRG: 244 | Disposition: A | Discharge status: Home / Self Care | Payer: Medicare Other | Source: Ambulatory Visit | Attending: Internal Medicine | Admitting: Internal Medicine

## 2021-08-10 ENCOUNTER — Encounter (HOSPITAL_COMMUNITY): Payer: Self-pay | Admitting: Emergency Medicine

## 2021-08-10 ENCOUNTER — Emergency Department (HOSPITAL_COMMUNITY): Payer: Medicare Other

## 2021-08-10 DIAGNOSIS — R531 Weakness: Secondary | ICD-10-CM | POA: Diagnosis present

## 2021-08-10 DIAGNOSIS — Z79899 Other long term (current) drug therapy: Secondary | ICD-10-CM | POA: Diagnosis not present

## 2021-08-10 DIAGNOSIS — R001 Bradycardia, unspecified: Secondary | ICD-10-CM | POA: Diagnosis not present

## 2021-08-10 DIAGNOSIS — Z806 Family history of leukemia: Secondary | ICD-10-CM | POA: Diagnosis not present

## 2021-08-10 DIAGNOSIS — R42 Dizziness and giddiness: Secondary | ICD-10-CM | POA: Diagnosis present

## 2021-08-10 DIAGNOSIS — I700 Atherosclerosis of aorta: Secondary | ICD-10-CM | POA: Diagnosis not present

## 2021-08-10 DIAGNOSIS — Z888 Allergy status to other drugs, medicaments and biological substances status: Secondary | ICD-10-CM | POA: Diagnosis not present

## 2021-08-10 DIAGNOSIS — I10 Essential (primary) hypertension: Secondary | ICD-10-CM | POA: Diagnosis present

## 2021-08-10 DIAGNOSIS — F32A Depression, unspecified: Secondary | ICD-10-CM | POA: Diagnosis present

## 2021-08-10 DIAGNOSIS — I4821 Permanent atrial fibrillation: Secondary | ICD-10-CM | POA: Diagnosis not present

## 2021-08-10 DIAGNOSIS — R9431 Abnormal electrocardiogram [ECG] [EKG]: Secondary | ICD-10-CM | POA: Diagnosis not present

## 2021-08-10 DIAGNOSIS — I4891 Unspecified atrial fibrillation: Secondary | ICD-10-CM | POA: Diagnosis present

## 2021-08-10 DIAGNOSIS — I452 Bifascicular block: Secondary | ICD-10-CM | POA: Diagnosis present

## 2021-08-10 DIAGNOSIS — Z20822 Contact with and (suspected) exposure to covid-19: Secondary | ICD-10-CM | POA: Diagnosis present

## 2021-08-10 DIAGNOSIS — Z950 Presence of cardiac pacemaker: Secondary | ICD-10-CM | POA: Diagnosis not present

## 2021-08-10 DIAGNOSIS — Z882 Allergy status to sulfonamides status: Secondary | ICD-10-CM | POA: Diagnosis not present

## 2021-08-10 DIAGNOSIS — Z006 Encounter for examination for normal comparison and control in clinical research program: Secondary | ICD-10-CM | POA: Diagnosis not present

## 2021-08-10 DIAGNOSIS — Z7901 Long term (current) use of anticoagulants: Secondary | ICD-10-CM | POA: Diagnosis not present

## 2021-08-10 DIAGNOSIS — I499 Cardiac arrhythmia, unspecified: Secondary | ICD-10-CM

## 2021-08-10 DIAGNOSIS — I442 Atrioventricular block, complete: Secondary | ICD-10-CM | POA: Diagnosis not present

## 2021-08-10 DIAGNOSIS — Z880 Allergy status to penicillin: Secondary | ICD-10-CM

## 2021-08-10 DIAGNOSIS — R918 Other nonspecific abnormal finding of lung field: Secondary | ICD-10-CM | POA: Diagnosis not present

## 2021-08-10 LAB — CBC WITH DIFFERENTIAL/PLATELET
Abs Immature Granulocytes: 0.01 10*3/uL (ref 0.00–0.07)
Basophils Absolute: 0 10*3/uL (ref 0.0–0.1)
Basophils Relative: 0 %
Eosinophils Absolute: 0.1 10*3/uL (ref 0.0–0.5)
Eosinophils Relative: 2 %
HCT: 40.5 % (ref 36.0–46.0)
Hemoglobin: 12.7 g/dL (ref 12.0–15.0)
Immature Granulocytes: 0 %
Lymphocytes Relative: 22 %
Lymphs Abs: 1.9 10*3/uL (ref 0.7–4.0)
MCH: 27 pg (ref 26.0–34.0)
MCHC: 31.4 g/dL (ref 30.0–36.0)
MCV: 86.2 fL (ref 80.0–100.0)
Monocytes Absolute: 1 10*3/uL (ref 0.1–1.0)
Monocytes Relative: 11 %
Neutro Abs: 5.6 10*3/uL (ref 1.7–7.7)
Neutrophils Relative %: 65 %
Platelets: 293 10*3/uL (ref 150–400)
RBC: 4.7 MIL/uL (ref 3.87–5.11)
RDW: 14.1 % (ref 11.5–15.5)
WBC: 8.6 10*3/uL (ref 4.0–10.5)
nRBC: 0 % (ref 0.0–0.2)

## 2021-08-10 LAB — RESP PANEL BY RT-PCR (FLU A&B, COVID) ARPGX2
Influenza A by PCR: NEGATIVE
Influenza B by PCR: NEGATIVE
SARS Coronavirus 2 by RT PCR: NEGATIVE

## 2021-08-10 LAB — BASIC METABOLIC PANEL
Anion gap: 8 (ref 5–15)
BUN: 27 mg/dL — ABNORMAL HIGH (ref 8–23)
CO2: 24 mmol/L (ref 22–32)
Calcium: 9.6 mg/dL (ref 8.9–10.3)
Chloride: 107 mmol/L (ref 98–111)
Creatinine, Ser: 1.07 mg/dL — ABNORMAL HIGH (ref 0.44–1.00)
GFR, Estimated: 49 mL/min — ABNORMAL LOW (ref >60)
Glucose, Bld: 95 mg/dL (ref 70–99)
Potassium: 4 mmol/L (ref 3.5–5.1)
Sodium: 139 mmol/L (ref 135–145)

## 2021-08-10 LAB — URINALYSIS, ROUTINE W REFLEX MICROSCOPIC
Bacteria, UA: NONE SEEN
Bilirubin Urine: NEGATIVE
Glucose, UA: NEGATIVE mg/dL
Hgb urine dipstick: NEGATIVE
Ketones, ur: 5 mg/dL — AB
Leukocytes,Ua: NEGATIVE
Nitrite: NEGATIVE
Protein, ur: 30 mg/dL — AB
Specific Gravity, Urine: 1.019 (ref 1.005–1.030)
pH: 5 (ref 5.0–8.0)

## 2021-08-10 LAB — PHOSPHORUS: Phosphorus: 2.9 mg/dL (ref 2.5–4.6)

## 2021-08-10 LAB — TROPONIN I (HIGH SENSITIVITY): Troponin I (High Sensitivity): 20 ng/L — ABNORMAL HIGH (ref <18)

## 2021-08-10 LAB — MAGNESIUM: Magnesium: 2.5 mg/dL — ABNORMAL HIGH (ref 1.7–2.4)

## 2021-08-10 MED ORDER — ACETAMINOPHEN 325 MG PO TABS
650.0000 mg | ORAL_TABLET | ORAL | Status: DC | PRN
  Administered 2021-08-11 – 2021-08-12 (×2): 650 mg via ORAL
  Filled 2021-08-10 (×2): qty 2

## 2021-08-10 MED ORDER — ONDANSETRON HCL 4 MG/2ML IJ SOLN
4.0000 mg | Freq: Four times a day (QID) | INTRAMUSCULAR | Status: DC | PRN
  Administered 2021-08-11: 4 mg via INTRAVENOUS
  Filled 2021-08-10: qty 2

## 2021-08-10 MED ORDER — HYDRALAZINE HCL 20 MG/ML IJ SOLN
5.0000 mg | Freq: Four times a day (QID) | INTRAMUSCULAR | Status: DC | PRN
  Filled 2021-08-10: qty 1

## 2021-08-10 NOTE — Telephone Encounter (Signed)
I spoke with Ashlee Warren; Ashlee Warren is in [place] and nephew is with pt; difficult to get BP and Ashlee Warren not sure last BP. Last pulse in high 30's 36 - 39 but pulse ox reading is 98%. on pulse ox.No CP,H/A but gets slightly lightheaded when she gets up and some SOB that has been going on for a while;not a new symptom. Pt has a lot of morning fog and disorientation at times. Pt has not taken diltiazem since 07/29/21. Please see 07-28-21 pt message also.Yesterday her granddaughter took pt to see the leaves in [place] on 08/09/21 and pt got very disorientated and confused.Today Ashlee Warren has not talked with pt or nephew this morning. Pt stares out in space a lot since pts husband passed away last year. Pt still lives by herself. I spoke with Ashlee Warren and he faced timed pt this morning and mental confusion seemed worse.Ashlee Warren said that after pt sat down for a few mins pt was little bit more orientated and less confused.Ashlee Warren and Ashlee Warren does not think pt needs to go to ED but wants to know what [name] thinks needs to do next. Since Ashlee Warren is out of town Ashlee Warren ask that he be called after note reviewed by [name] and Ashlee Warren will get in touch with Ashlee Warren. UC & ED precautions given again and Ashlee Warren and Ashlee Warren both voiced understanding. Sending note to Dr Joanne Gavel CMA and will teams [facility].

## 2021-08-10 NOTE — H&P (Signed)
Cardiology Admission History and Physical:   Patient ID: Ashlee Warren MRN: [id number]; DOB: [birth year]   Admission date: 08/10/2021  PCP:  Tonia Ghent, MD   [facility] Cardiologist:  Jenkins Rouge, MD     Chief Complaint:  Weakness, Bradycardia  Patient Profile:   Ashlee Warren is a 87 y.o. female with permanent A. 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  History of Present Illness:   Ashlee Warren is a 87 year old female who is followed by [name] as an outpatient.  Underwent cardioversion in May 2013 with restoration of sinus rhythm.  Subsequently back into A. fib and was started on flecainide which ultimately was stopped due to QRS widening.  No reported history of CAD.  History of carotid artery disease with no significant stenosis on duplex from 07/2017.  She is known to have a chronic biphasic block, right bundle branch block/left anterior fascicular block with no high-grade AV blocks or history of syncope.  She had a cystoscopy in December 2021 with a right ureteral stent placed, in the setting of hematuria.  Has a known history of a right inguinal hernia and has seen surgery with no plans to intervene previously but had recently seen Dr. Kieth Brightly with potential plans for surgery in the near future. She was last seen in the office on 05/2021 by [name]. Rates were well controlled on Cardizem 120mg  daily ( which was reduced from prior visit 04/2021) and continue on Xarelto 15mg  daily. She was cleared for hernia surgery if needed.   Presented to the ED on 10/27 with weakness and bradycardia. Notes indicate she was instructed to stop her Diltiazem back on 07/28/21 in the setting of bradycardia by her PCP. She called her PCP today and instructed to present to the ED.   Labs on admission showed Na+ 139, K+ 4.0, Cr 1.07, WBC 8.6, Hgb  12.7, Mag 2.5, hsTn 20. EKG showed junctional bradycardia 42 bpm, RBBB/LAFB. CXR with chronic fibrotic changes in the bases. Noted in afib with VR of 35-40 on telemetry in the ED. She denies any syncope PTA, but has had some intermittent dizziness. No chest pain.    Past Medical History:  Diagnosis Date   Atrial fibrillation [facility])    Dysrhythmia    afib   Essential hypertension     Past Surgical History:  Procedure Laterality Date   BREAST BIOPSY     left   CARDIOVERSION N/A 03/11/2013   Procedure: CARDIOVERSION;  Surgeon: Josue Hector, MD;  Location: [place];  Service: Cardiovascular;  Laterality: N/A;   CATARACT EXTRACTION, BILATERAL  2000   CYSTOSCOPY/URETEROSCOPY/HOLMIUM LASER/STENT PLACEMENT Right 09/16/2020   Procedure: CYSTOSCOPY, RETROGRADE PYELOGRAM, URETEROSCOPY, STENT PLACEMENT;  Surgeon: Janith Lima, MD;  Location: [facility];  Service: Urology;  Laterality: Right;  ONLY NEEDS 45 MIN   EYE SURGERY     TONSILLECTOMY       Medications Prior to Admission: Prior to Admission medications   Medication Sig Start Date End Date Taking? Authorizing Provider  diltiazem (CARDIZEM) 30 MG tablet Held as of 07/28/21 07/28/21   Tonia Ghent, MD  Multiple Vitamins-Minerals (PRESERVISION AREDS 2+MULTI VIT PO) Take 1 capsule by mouth in the morning and at bedtime.     [provider]  Rivaroxaban (XARELTO) 15 MG TABS tablet TAKE 1 TABLET (15 MG TOTAL) BY MOUTH DAILY WITH SUPPER. 07/21/21   Tonia Ghent,  MD     Allergies:    Allergies  Allergen Reactions   Penicillins Rash   Sulfonamide Derivatives Rash   Tamsulosin Swelling    Swollen Lips    Social History:   Social History   Socioeconomic History   Marital status: Widowed    Spouse name: Not on file   Number of children: Not on file   Years of education: Not on file   Highest education level: Not on file  Occupational History   Not on file  Tobacco Use   Smoking status: Never   Smokeless tobacco:  Never  Vaping Use   Vaping Use: Never used  Substance and Sexual Activity   Alcohol use: No   Drug use: No   Sexual activity: Not Currently    Birth control/protection: None  Other Topics Concern   Not on file  Social History Narrative   Widowed 2021, was married 11/05/47   Retired Therapist, sports, worked as Government social research officer, then worked with [employer], 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     Social Determinants of Health   Financial Resource Strain: Not on file  Food Insecurity: Not on file  Transportation Needs: Not on file  Physical Activity: Not on file  Stress: Not on file  Social Connections: Not on file  Intimate Partner Violence: Not on file    Family History:   The patient's family history includes Cancer in her mother and sister.    ROS:  Please see the history of present illness.  All other ROS reviewed and negative.     Physical Exam/Data:   Vitals:   08/10/21 1446 08/10/21 1758 08/10/21 1830 08/10/21 1900  BP: (!) 172/57 (!) 190/85 (!) 191/36 102/61  Pulse: (!) 42 (!) 37 (!) 37 (!) 40  Resp: 20 10 20 20   Temp: 98.1 F (36.7 C)     TempSrc: Oral     SpO2: 100% 100% 99% 96%   No intake or output data in the 24 hours ending 08/10/21 1920 Last 3 Weights 07/21/2021 05/24/2021 04/20/2021  Weight (lbs) 112 lb 113 lb 12.8 oz 116 lb  Weight (kg) 50.803 kg 51.619 kg 52.617 kg     There is no height or weight on file to calculate BMI.  General:  Well nourished, well developed, in no acute distress HEENT: normal Neck: no JVD Vascular: No carotid bruits; Distal pulses 2+ bilaterally   Cardiac:  normal S1, S2; RRR; soft systolic murmur  Lungs:  clear to auscultation bilaterally, no wheezing, rhonchi or rales  Abd: soft, nontender, no hepatomegaly  Ext: no edema Musculoskeletal:  No deformities, BUE and BLE strength normal and equal Skin: warm and dry  Neuro:  CNs 2-12 intact, no focal abnormalities noted Psych:  Normal affect    EKG:  The ECG that was done 10/27 was personally  reviewed and demonstrates junctional bradycardia, 42 bpm  Relevant CV Studies:  Echo: 09/2012  Study Conclusions   - Left ventricle: The cavity size was normal. Wall thickness    was normal. Systolic function was normal. The estimated    ejection fraction was in the range of 55% to 60%.  - Mitral valve: Mild regurgitation.  Transthoracic echocardiography.  M-mode, complete 2D,  spectral Doppler, and color Doppler.  Height:  Height:  157.5cm. Height: 62in.  Weight:  Weight: 59.9kg. Weight:  131.7lb.  Body mass index:  BMI: 24.1kg/m^2.  Body surface  area:    BSA: 1.58m^2.  Blood pressure:  146/88.  Patient  status:  Outpatient.  Location:  [facility]   Laboratory Data:  High Sensitivity Troponin:   Recent Labs  Lab 08/10/21 1802  TROPONINIHS 20*      Chemistry Recent Labs  Lab 08/10/21 1635 08/10/21 1802  NA 139  --   K 4.0  --   CL 107  --   CO2 24  --   GLUCOSE 95  --   BUN 27*  --   CREATININE 1.07*  --   CALCIUM 9.6  --   MG  --  2.5*  GFRNONAA 49*  --   ANIONGAP 8  --     No results for input(s): PROT, ALBUMIN, AST, ALT, ALKPHOS, BILITOT in the last 168 hours. Lipids No results for input(s): CHOL, TRIG, HDL, LABVLDL, LDLCALC, CHOLHDL in the last 168 hours. Hematology Recent Labs  Lab 08/10/21 1635  WBC 8.6  RBC 4.70  HGB 12.7  HCT 40.5  MCV 86.2  MCH 27.0  MCHC 31.4  RDW 14.1  PLT 293   Thyroid No results for input(s): TSH, FREET4 in the last 168 hours. BNPNo results for input(s): BNP, PROBNP in the last 168 hours.  DDimer No results for input(s): DDIMER in the last 168 hours.   Radiology/Studies:  DG Chest 2 View  Result Date: 08/10/2021 CLINICAL DATA:  Per triage notes: "Patient with history of afib sent to ED for further evaluation after trying multiple dose adjustments at home of diltiazem, but even after completing stopping the medication patient's heart rate still 35-45 EXAM: CHEST - 2 VIEW COMPARISON:  12/09/2014 FINDINGS:  Coarse bilateral micronodular interstitial opacities involving bases more than apices, progressive since previous exam. No confluent airspace disease. Mild pulmonary hyperinflation. Heart size and mediastinal contours are within normal limits. Aortic Atherosclerosis (ICD10-170.0). No effusion. Visualized bones unremarkable. IMPRESSION: 1. Progressive probable chronic fibrotic changes involving the bases more than apices. No definite acute findings. Electronically Signed   By: Lucrezia Europe M.D.   On: 08/10/2021 16:57     Assessment and Plan:   Ashlee Warren is a 87 y.o. female with permanent A. 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  Bradycardia: previously on diltiazem 120mg  daily which was stopped about 2 weeks prior. She denies weakness, but has not been herself. Has not had her usual energy to work around her house like usual. No syncope, but intermittent episodes of dizziness and light-headedness at times. These do seem to resolve quickly per her report.  -- admit to tele -- check echo, TSH -- will need EP consult in am to determine PPM -- NPO at midnight  Permanent Afib: known hx of the same for many years. Previously tried on flecainide but stopped with widening QRS. Has been on rate control strategy. As above, dilt stopped 2 weeks prior in the setting of ongoing bradycardia. She has been compliant with Xarelto PTA -- hold Xarelto on admission with plans for PPM  HTN: Blood pressures are quite elevated in the ED. Continue tom oral BP meds for now -- PRN hydralazine for BP >616 systolic   Risk Assessment/Risk Scores:   CHA2DS2-VASc Score = 5  :This indicates a 7.2% annual risk of stroke. The patient's score is based upon: CHF History: 0 HTN History: 1 Diabetes History: 0 Stroke History: 0 Vascular Disease History: 1 Age Score: 2 Gender Score: 1    Severity of Illness:  The appropriate patient status for this patient is OBSERVATION. Observation status is judged to be reasonable and necessary in order to provide the required intensity of service to ensure the patient's safety. The patient's presenting symptoms, physical exam findings, and initial radiographic and laboratory data in the context of their medical condition is felt to place them at decreased risk for further clinical deterioration. Furthermore, it is anticipated that the patient will be medically stable for discharge from the hospital within 2 midnights of admission.    For questions or updates, please contact [place] Please consult [email or web address] for contact info under     Signed, Reino Bellis, NP  08/10/2021 7:20 PM

## 2021-08-10 NOTE — Telephone Encounter (Signed)
Spoke with Patients nephew Quillian Quince and advised ER recommendation; he agreed and stated they will take her in.

## 2021-08-10 NOTE — ED Provider Notes (Signed)
Emergency Medicine Provider Triage Evaluation Note  Ashlee Warren , a 87 y.o. female  was evaluated in triage.  Pt complains of weakness and bradycardia.  She has chronic A. fib.  Previously on Cardizem and DOAC.  I been dealing with some bradycardia at home over the last month.  Cardiology took her off her Cardizem 2 weeks ago.  Noted to have increased weakness due to bradycardia.  Call PCP and [name] who told her to come here for the emergency department  Review of Systems  Positive: Weakness, bradycardia Negative: Chest pain, shortness of breath, back pain, numbness  Physical Exam  BP (!) 172/57 (BP Location: Left Arm)   Pulse (!) 42   Temp 98.1 F (36.7 C) (Oral)   Resp 20   SpO2 100%  Gen:   Awake, no distress   Resp:  Normal effort  MSK:   Moves extremities without difficulty  Other:    Medical Decision Making  Medically screening exam initiated at 4:27 PM.  Appropriate orders placed.  Ashlee Warren was informed that the remainder of the evaluation will be completed by another provider, this initial triage assessment does not replace that evaluation, and the importance of remaining in the ED until their evaluation is complete.  Patient with symptomatic bradycardia, heart rate high 30s in room.  Work-up started.  Charge nurse Lovena Le notified patient needs room in back   Etna Green, Ashlee Warren A, PA-C 08/10/21 1628    Pattricia Boss, MD 08/11/21 1141

## 2021-08-10 NOTE — ED Triage Notes (Signed)
Patient with history of afib sent to ED for further evaluation after trying multiple dose adjustments at home of diltiazem, but even after completing stopping the medication patient's heart rate still 35-45. Patient reports intermittent lightheadedness but is not lightheaded at this time. Patient alert, oriented, and in no apparent distress at this time.

## 2021-08-10 NOTE — ED Provider Notes (Signed)
Emergency Department Provider Note   I have reviewed the triage vital signs and the nursing notes.   HISTORY  Chief Complaint Bradycardia   HPI Ashlee Warren is a 87 y.o. female with past medical history reviewed below including A. fib and hypertension, followed by [name] presents to the ED with persistent bradycardia and fatigue/lightheadedness.  Patient has had this issue for the past 6 weeks.  Most of the history is given by the patient but also family member at bedside.  She had been on diltiazem for her atrial fibrillation but family began noticing low heart rate and elevated blood pressure.  She was titrated off of her diltiazem and has been off entirely for the past 2 weeks.  The family note continued bradycardia, often into the 30s.  Patient is feeling fatigue and lightheadedness especially with standing, although, family report blood pressures have remained elevated at home.  She is not having fever or chills.  No chest pain.  The family has noticed some mild confusion/forgetfulness especially in the morning but notes this has not been severe.  Patient states when she is at rest she is feeling mostly well.  The patient reached out to her family medicine doctor and was referred to the emergency department with continued bradycardia and symptoms.   Past Medical History:  Diagnosis Date   Atrial fibrillation [facility])    Dysrhythmia    afib   Essential hypertension     Patient Active Problem List   Diagnosis Date Noted   Bradycardia 07/23/2021   Impacted cerumen of right ear 04/23/2021   Aortic atherosclerosis ([place]) 07/03/2020   Coronary atherosclerosis 07/03/2020   Hematuria 06/29/2020   Medicare annual wellness visit, subsequent 06/08/2019   Bursitis 04/05/2019   Edema 04/05/2019   Right hip pain 06/20/2018   Dysfunction of right eustachian tube 03/25/2018   Zoster 05/07/2017   Advance care planning 10/27/2015   Depression 07/28/2014   Carotid bruit 06/08/2013    Atrial fibrillation ([place]) 09/30/2012   Essential hypertension 09/30/2012    Past Surgical History:  Procedure Laterality Date   BREAST BIOPSY     left   CARDIOVERSION N/A 03/11/2013   Procedure: CARDIOVERSION;  Surgeon: Josue Hector, MD;  Location: [place];  Service: Cardiovascular;  Laterality: N/A;   CATARACT EXTRACTION, BILATERAL  2000   CYSTOSCOPY/URETEROSCOPY/HOLMIUM LASER/STENT PLACEMENT Right 09/16/2020   Procedure: CYSTOSCOPY, RETROGRADE PYELOGRAM, URETEROSCOPY, STENT PLACEMENT;  Surgeon: Janith Lima, MD;  Location: [facility];  Service: Urology;  Laterality: Right;  ONLY NEEDS 45 MIN   EYE SURGERY     TONSILLECTOMY      Allergies Penicillins, Sulfonamide derivatives, and Tamsulosin  Family History  Problem Relation Age of Onset   Cancer Mother        leukemia   Cancer Sister     Social History Social History   Tobacco Use   Smoking status: Never   Smokeless tobacco: Never  Vaping Use   Vaping Use: Never used  Substance Use Topics   Alcohol use: No   Drug use: No    Review of Systems  Constitutional: No fever/chills. Positive fatigue and lightheadedness.  Eyes: No visual changes. ENT: No sore throat. Cardiovascular: Denies chest pain. Positive bradycardia.  Respiratory: Denies shortness of breath. Gastrointestinal: No abdominal pain.  No nausea, no vomiting.  No diarrhea.  No constipation. Genitourinary: Negative for dysuria. Musculoskeletal: Negative for back pain. Skin: Negative for rash. Neurological: Negative for headaches, focal weakness or numbness.  10-point ROS otherwise  negative.  ____________________________________________   PHYSICAL EXAM:  VITAL SIGNS: ED Triage Vitals  Enc Vitals Group     BP 08/10/21 1446 (!) 172/57     Pulse Rate 08/10/21 1446 (!) 42     Resp 08/10/21 1446 20     Temp 08/10/21 1446 98.1 F (36.7 C)     Temp Source 08/10/21 1446 Oral     SpO2 08/10/21 1446 100 %   Constitutional: Alert and oriented. Well  appearing and in no acute distress. Eyes: Conjunctivae are normal.  Head: Atraumatic. Nose: No congestion/rhinnorhea. Mouth/Throat: Mucous membranes are moist.  Neck: No stridor.   Cardiovascular: Bradycardia, A-fib. Good peripheral circulation. Grossly normal heart sounds.   Respiratory: Normal respiratory effort.  No retractions. Lungs CTAB. Gastrointestinal: Soft and nontender. No distention.  Musculoskeletal: No lower extremity tenderness nor edema. No gross deformities of extremities. Neurologic:  Normal speech and language. No gross focal neurologic deficits are appreciated.  Skin:  Skin is warm, dry and intact. No rash noted.   ____________________________________________   LABS (all labs ordered are listed, but only abnormal results are displayed)  Labs Reviewed  BASIC METABOLIC PANEL - Abnormal; Notable for the following components:      Result Value   BUN 27 (*)    Creatinine, Ser 1.07 (*)    GFR, Estimated 49 (*)    All other components within normal limits  MAGNESIUM - Abnormal; Notable for the following components:   Magnesium 2.5 (*)    All other components within normal limits  URINALYSIS, ROUTINE W REFLEX MICROSCOPIC - Abnormal; Notable for the following components:   APPearance HAZY (*)    Ketones, ur 5 (*)    Protein, ur 30 (*)    All other components within normal limits  TROPONIN I (HIGH SENSITIVITY) - Abnormal; Notable for the following components:   Troponin I (High Sensitivity) 20 (*)    All other components within normal limits  RESP PANEL BY RT-PCR (FLU A&B, COVID) [id number]  URINE CULTURE  CBC WITH DIFFERENTIAL/PLATELET  PHOSPHORUS  TSH  TROPONIN I (HIGH SENSITIVITY)   ____________________________________________  EKG   EKG Interpretation  Date/Time:  Thursday August 10 2021 14:58:09 EDT Ventricular Rate:  42 PR Interval:    QRS Duration: 116 QT Interval:  486 QTC Calculation: 405 R Axis:   -75 Text Interpretation: Junctional  bradycardia Right bundle branch block Left anterior fascicular block Bifasicular block Cannot rule out Inferior infarct (masked by fascicular block?) , age undetermined Anterior infarct , age undetermined Abnormal ECG Confirmed by Nanda Quinton [phone]) on 08/10/2021 5:19:10 PM        ____________________________________________  RADIOLOGY  DG Chest 2 View  Result Date: 08/10/2021 CLINICAL DATA:  Per triage notes: "Patient with history of afib sent to ED for further evaluation after trying multiple dose adjustments at home of diltiazem, but even after completing stopping the medication patient's heart rate still 35-45 EXAM: CHEST - 2 VIEW COMPARISON:  12/09/2014 FINDINGS: Coarse bilateral micronodular interstitial opacities involving bases more than apices, progressive since previous exam. No confluent airspace disease. Mild pulmonary hyperinflation. Heart size and mediastinal contours are within normal limits. Aortic Atherosclerosis (ICD10-170.0). No effusion. Visualized bones unremarkable. IMPRESSION: 1. Progressive probable chronic fibrotic changes involving the bases more than apices. No definite acute findings. Electronically Signed   By: Lucrezia Europe M.D.   On: 08/10/2021 16:57    ____________________________________________   PROCEDURES  Procedure(s) performed:   Procedures  CRITICAL CARE Performed by: Margette Fast Total critical  care time: 35 minutes Critical care time was exclusive of separately billable procedures and treating other patients. Critical care was necessary to treat or prevent imminent or life-threatening deterioration. Critical care was time spent personally by me on the following activities: development of treatment plan with patient and/or surrogate as well as nursing, discussions with consultants, evaluation of patient's response to treatment, examination of patient, obtaining history from patient or surrogate, ordering and performing treatments and interventions,  ordering and review of laboratory studies, ordering and review of radiographic studies, pulse oximetry and re-evaluation of patient's condition.  Nanda Quinton, MD Emergency Medicine  ____________________________________________   INITIAL IMPRESSION / ASSESSMENT AND PLAN / ED COURSE  Pertinent labs & imaging results that were available during my care of the patient were reviewed by me and considered in my medical decision making (see chart for details).   Patient presents emergency department with bradycardia and fatigue.  Initially, this was thought to be due to her diltiazem but was titrated off and has been off entirely for the past 2 weeks with continued symptoms.  Heart rate in the 30s to 40s here.  Patient is hypertensive.  She has no focal deficits to suspect stroke.  No infection symptoms.  She has some chronic fibrotic changes on chest x-ray but no infiltrate or pulmonary edema.  Have sent additional labs including electrolytes and TSH which are pending.  Will discuss symptomatic bradycardia with cardiology.  Mg is slightly high. Normal phos. Troponin not significantly elevated. COVID and Flu are negative. No clear electrolyte abnormality. TSH is pending.   Discussed patient's case with Dr. Debara Pickett with Cardiology to request admission. Patient and family (if present) updated with plan. Care transferred to Cardiology service.  I reviewed all nursing notes, vitals, pertinent old records, EKGs, labs, imaging (as available).  ____________________________________________  FINAL CLINICAL IMPRESSION(S) / ED DIAGNOSES  Final diagnoses:  Symptomatic bradycardia     MEDICATIONS GIVEN DURING THIS VISIT:  Medications  hydrALAZINE (APRESOLINE) injection 5 mg (has no administration in time range)     Note:  This document was prepared using Dragon voice recognition software and may include unintentional dictation errors.  Nanda Quinton, MD, [facility] Emergency Medicine    Cherree Conerly, [facility],  MD 08/10/21 [phone]

## 2021-08-10 NOTE — Telephone Encounter (Signed)
Please call them about getting to the ER, esp with bradycardia in spite of stopping diltiazem.  Thanks.

## 2021-08-11 ENCOUNTER — Inpatient Hospital Stay (HOSPITAL_COMMUNITY)
Admission: EM | Disposition: A | Discharge status: Home / Self Care | Payer: Self-pay | Source: Ambulatory Visit | Attending: Internal Medicine

## 2021-08-11 ENCOUNTER — Inpatient Hospital Stay (HOSPITAL_COMMUNITY): Payer: Medicare Other

## 2021-08-11 DIAGNOSIS — Z950 Presence of cardiac pacemaker: Secondary | ICD-10-CM

## 2021-08-11 DIAGNOSIS — R9431 Abnormal electrocardiogram [ECG] [EKG]: Secondary | ICD-10-CM | POA: Diagnosis not present

## 2021-08-11 DIAGNOSIS — I4891 Unspecified atrial fibrillation: Secondary | ICD-10-CM | POA: Diagnosis not present

## 2021-08-11 DIAGNOSIS — I442 Atrioventricular block, complete: Secondary | ICD-10-CM | POA: Diagnosis not present

## 2021-08-11 HISTORY — PX: PACEMAKER IMPLANT: EP1218

## 2021-08-11 LAB — ECHOCARDIOGRAM COMPLETE
AR max vel: 1.62 cm2
AV Area VTI: 1.7 cm2
AV Area mean vel: 1.69 cm2
AV Mean grad: 5 mmHg
AV Peak grad: 8.8 mmHg
Ao pk vel: 1.48 m/s
S' Lateral: 2.8 cm

## 2021-08-11 LAB — URINE CULTURE: Culture: <10000 — AB

## 2021-08-11 LAB — TROPONIN I (HIGH SENSITIVITY): Troponin I (High Sensitivity): 27 ng/L — ABNORMAL HIGH (ref <18)

## 2021-08-11 SURGERY — PACEMAKER IMPLANT

## 2021-08-11 MED ORDER — LIDOCAINE HCL (PF) 1 % IJ SOLN
INTRAMUSCULAR | Status: DC | PRN
  Administered 2021-08-11: 25 mL

## 2021-08-11 MED ORDER — DILTIAZEM HCL 60 MG PO TABS
60.0000 mg | ORAL_TABLET | Freq: Three times a day (TID) | ORAL | Status: DC
  Administered 2021-08-11 – 2021-08-12 (×2): 60 mg via ORAL
  Filled 2021-08-11 (×2): qty 1

## 2021-08-11 MED ORDER — LIDOCAINE HCL 1 % IJ SOLN
INTRAMUSCULAR | Status: AC
  Filled 2021-08-11: qty 60

## 2021-08-11 MED ORDER — VANCOMYCIN HCL IN DEXTROSE 1-5 GM/200ML-% IV SOLN
INTRAVENOUS | Status: AC
  Filled 2021-08-11: qty 200

## 2021-08-11 MED ORDER — HEPARIN (PORCINE) IN NACL 1000-0.9 UT/500ML-% IV SOLN
INTRAVENOUS | Status: DC | PRN
  Administered 2021-08-11: 500 mL

## 2021-08-11 MED ORDER — VANCOMYCIN HCL IN DEXTROSE 1-5 GM/200ML-% IV SOLN
1000.0000 mg | INTRAVENOUS | Status: AC
Start: 2021-08-11 — End: 2021-08-11
  Administered 2021-08-11: 1000 mg via INTRAVENOUS
  Filled 2021-08-11: qty 200

## 2021-08-11 MED ORDER — CHLORHEXIDINE GLUCONATE 4 % EX LIQD: 60.0000 mL | Freq: Once | CUTANEOUS | Status: DC

## 2021-08-11 MED ORDER — SODIUM CHLORIDE 0.9 % IV SOLN
80.0000 mg | INTRAVENOUS | Status: AC
  Administered 2021-08-11: 80 mg
  Filled 2021-08-11: qty 2

## 2021-08-11 MED ORDER — SODIUM CHLORIDE 0.9 % IV SOLN: INTRAVENOUS | Status: DC

## 2021-08-11 MED ORDER — HEPARIN (PORCINE) IN NACL 1000-0.9 UT/500ML-% IV SOLN
INTRAVENOUS | Status: AC
  Filled 2021-08-11: qty 500

## 2021-08-11 MED ORDER — HYDRALAZINE HCL 20 MG/ML IJ SOLN
INTRAMUSCULAR | Status: AC
  Filled 2021-08-11: qty 1

## 2021-08-11 MED ORDER — DIPHENHYDRAMINE HCL 50 MG/ML IJ SOLN
INTRAMUSCULAR | Status: DC | PRN
  Administered 2021-08-11: 25 mg via INTRAVENOUS

## 2021-08-11 MED ORDER — SODIUM CHLORIDE 0.9 % IV SOLN
INTRAVENOUS | Status: AC
  Filled 2021-08-11: qty 2

## 2021-08-11 MED ORDER — SODIUM CHLORIDE 0.9 % IV SOLN
INTRAVENOUS | Status: DC
Start: 2021-08-11 — End: 2021-08-11

## 2021-08-11 MED ORDER — HYDRALAZINE HCL 20 MG/ML IJ SOLN
INTRAMUSCULAR | Status: DC | PRN
  Administered 2021-08-11 (×2): 10 mg via INTRAVENOUS

## 2021-08-11 MED ORDER — IOHEXOL 350 MG/ML SOLN
INTRAVENOUS | Status: DC | PRN
  Administered 2021-08-11 (×2): 15 mL

## 2021-08-11 MED ORDER — DIPHENHYDRAMINE HCL 50 MG/ML IJ SOLN
INTRAMUSCULAR | Status: AC
  Filled 2021-08-11: qty 1

## 2021-08-11 SURGICAL SUPPLY — 12 items
CABLE SURGICAL S-101-97-12 (CABLE) ×2 IMPLANT
CATH RIGHTSITE C315HIS02 (CATHETERS) ×1 IMPLANT
HEMOSTAT SNOW SURGICEL 2X4 (HEMOSTASIS) ×1 IMPLANT
IPG PACE AZUR XT SR MRI W1SR01 (Pacemaker) IMPLANT
KIT MICROPUNCTURE NIT STIFF (SHEATH) ×1 IMPLANT
LEAD ISOFLEX OPT 1948-52CM (Lead) ×1 IMPLANT
LEAD SELECT SECURE 3830 383069 (Lead) IMPLANT
PACE AZURE XT SR MRI W1SR01 (Pacemaker) ×2 IMPLANT
PAD PRO RADIOLUCENT 2001M-C (PAD) ×2 IMPLANT
SELECT SECURE 3830 383069 (Lead) ×2 IMPLANT
SHEATH 7FR PRELUDE SNAP 13 (SHEATH) ×1 IMPLANT
TRAY PACEMAKER INSERTION (PACKS) ×2 IMPLANT

## 2021-08-11 NOTE — Progress Notes (Signed)
DAILY PROGRESS NOTE   Patient Name: Ashlee Warren Date of Encounter: 08/11/2021 Cardiologist: Jenkins Rouge, MD  Chief Complaint   No complaints  Patient Profile   Ashlee Warren is a 87 y.o. female with permanent A. 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  Subjective   HR remained in the 30's overnight -dizzy this morning. She is having PVC's as well. EP to evaluate for pacemaker.  Objective   Vitals:   08/11/21 0200 08/11/21 0245 08/11/21 0545 08/11/21 0915  BP: (!) 185/39 (!) 168/45 (!) 180/63   Pulse: (!) 35 (!) 37 (!) 36 (!) 38  Resp: 16 12 12 13   Temp:      TempSrc:      SpO2: 98% 98% 100% 98%   No intake or output data in the 24 hours ending 08/11/21 0921 There were no vitals filed for this visit.  Physical Exam   General appearance: alert and no distress Neck: no carotid bruit, no JVD, and thyroid not enlarged, symmetric, no tenderness/mass/nodules Lungs: clear to auscultation bilaterally Heart: irregular bradycardia Abdomen: soft, non-tender; bowel sounds normal; no masses,  no organomegaly Extremities: extremities normal, atraumatic, no cyanosis or edema Pulses: 2+ and symmetric Skin: Skin color, texture, turgor normal. No rashes or lesions Neurologic: Grossly normal Psych: Pleasant  Inpatient Medications    Scheduled Meds:   Continuous Infusions:   PRN Meds: acetaminophen, hydrALAZINE, ondansetron (ZOFRAN) IV   Labs   Results for orders placed or performed during the hospital encounter of 08/10/21 (from the past 48 hour(s))  CBC with Differential     Status: None   Collection Time: 08/10/21  4:35 PM  Result Value Ref Range   WBC 8.6 4.0 - 10.5 K/uL   RBC 4.70 3.87 - 5.11 MIL/uL   Hemoglobin 12.7 12.0 - 15.0 g/dL   HCT 40.5 36.0 - 46.0 %   MCV 86.2 80.0 - 100.0 fL   MCH 27.0 26.0 - 34.0 pg   MCHC 31.4  30.0 - 36.0 g/dL   RDW 14.1 11.5 - 15.5 %   Platelets 293 150 - 400 K/uL   nRBC 0.0 0.0 - 0.2 %   Neutrophils Relative % 65 %   Neutro Abs 5.6 1.7 - 7.7 K/uL   Lymphocytes Relative 22 %   Lymphs Abs 1.9 0.7 - 4.0 K/uL   Monocytes Relative 11 %   Monocytes Absolute 1.0 0.1 - 1.0 K/uL   Eosinophils Relative 2 %   Eosinophils Absolute 0.1 0.0 - 0.5 K/uL   Basophils Relative 0 %   Basophils Absolute 0.0 0.0 - 0.1 K/uL   Immature Granulocytes 0 %   Abs Immature Granulocytes 0.01 0.00 - 0.07 K/uL    Comment: Performed at [facility], [address] [place], [place] [address]  Basic metabolic panel     Status: Abnormal   Collection Time: 08/10/21  4:35 PM  Result Value Ref Range   Sodium 139 135 - 145 mmol/L   Potassium 4.0 3.5 - 5.1 mmol/L   Chloride 107 98 - 111 mmol/L   CO2 24 22 - 32 mmol/L   Glucose, Bld 95 70 - 99 mg/dL    Comment: Glucose reference range applies only to samples taken after fasting for at least 8 hours.   BUN 27 (H) 8 - 23 mg/dL   Creatinine, Ser 1.07 (H) 0.44 - 1.00 mg/dL   Calcium 9.6 8.9 -  10.3 mg/dL   GFR, Estimated 49 (L) >60 mL/min    Comment: (NOTE) Calculated using the CKD-EPI Creatinine Equation (2021)    Anion gap 8 5 - 15    Comment: Performed at [facility], [place] [address], [place], [place] [address]  Resp Panel by RT-PCR (Flu A&B, Covid) Nasopharyngeal Swab     Status: None   Collection Time: 08/10/21  6:01 PM   Specimen: Nasopharyngeal Swab; Nasopharyngeal(NP) swabs in vial transport medium  Result Value Ref Range   SARS Coronavirus 2 by RT PCR NEGATIVE NEGATIVE    Comment: (NOTE) SARS-CoV-2 target nucleic acids are NOT DETECTED.  The SARS-CoV-2 RNA is generally detectable in upper respiratory specimens during the acute phase of infection. The lowest concentration of SARS-CoV-2 viral copies this assay can detect is 138 copies/mL. A negative result does not preclude SARS-Cov-2 infection and should not be used as the sole basis  for treatment or other patient management decisions. A negative result may occur with  improper specimen collection/handling, submission of specimen other than nasopharyngeal swab, presence of viral mutation(s) within the areas targeted by this assay, and inadequate number of viral copies(<138 copies/mL). A negative result must be combined with clinical observations, patient history, and epidemiological information. The expected result is Negative.  Fact Sheet for Patients:  EntrepreneurPulse.com.au  Fact Sheet for Healthcare Providers:  IncredibleEmployment.be  This test is no t yet approved or cleared by the [place] FDA and  has been authorized for detection and/or diagnosis of SARS-CoV-2 by FDA under an Emergency Use Authorization (EUA). This EUA will remain  in effect (meaning this test can be used) for the duration of the COVID-19 declaration under Section 564(b)(1) of the Act, 21 U.S.C.section 360bbb-3(b)(1), unless the authorization is terminated  or revoked sooner.       Influenza A by PCR NEGATIVE NEGATIVE   Influenza B by PCR NEGATIVE NEGATIVE    Comment: (NOTE) The Xpert Xpress SARS-CoV-2/FLU/RSV plus assay is intended as an aid in the diagnosis of influenza from Nasopharyngeal swab specimens and should not be used as a sole basis for treatment. Nasal washings and aspirates are unacceptable for Xpert Xpress SARS-CoV-2/FLU/RSV testing.  Fact Sheet for Patients: EntrepreneurPulse.com.au  Fact Sheet for Healthcare Providers: IncredibleEmployment.be  This test is not yet approved or cleared by the [place] FDA and has been authorized for detection and/or diagnosis of SARS-CoV-2 by FDA under an Emergency Use Authorization (EUA). This EUA will remain in effect (meaning this test can be used) for the duration of the COVID-19 declaration under Section 564(b)(1) of the Act, 21  U.S.C. section 360bbb-3(b)(1), unless the authorization is terminated or revoked.  Performed at [facility], [place] [address] [place], [place] [address]   Urinalysis, Routine w reflex microscopic Nasopharyngeal Swab     Status: Abnormal   Collection Time: 08/10/21  6:01 PM  Result Value Ref Range   Color, Urine YELLOW YELLOW   APPearance HAZY (A) CLEAR   Specific Gravity, Urine 1.019 1.005 - 1.030   pH 5.0 5.0 - 8.0   Glucose, UA NEGATIVE NEGATIVE mg/dL   Hgb urine dipstick NEGATIVE NEGATIVE   Bilirubin Urine NEGATIVE NEGATIVE   Ketones, ur 5 (A) NEGATIVE mg/dL   Protein, ur 30 (A) NEGATIVE mg/dL   Nitrite NEGATIVE NEGATIVE   Leukocytes,Ua NEGATIVE NEGATIVE   RBC / HPF 0-5 0 - 5 RBC/hpf   WBC, UA 0-5 0 - 5 WBC/hpf   Bacteria, UA NONE SEEN NONE SEEN  Squamous Epithelial / LPF 0-5 0 - 5   Hyaline Casts, UA PRESENT     Comment: Performed at [facility], [place] [address], [place], [place] [address]  Troponin I (High Sensitivity)     Status: Abnormal   Collection Time: 08/10/21  6:02 PM  Result Value Ref Range   Troponin I (High Sensitivity) 20 (H) <18 ng/L    Comment: (NOTE) Elevated high sensitivity troponin I (hsTnI) values and significant  changes across serial measurements may suggest ACS but many other  chronic and acute conditions are known to elevate hsTnI results.  Refer to the "Links" section for chest pain algorithms and additional  guidance. Performed at [facility], [place] [address] [place], [place] [address]   Magnesium     Status: Abnormal   Collection Time: 08/10/21  6:02 PM  Result Value Ref Range   Magnesium 2.5 (H) 1.7 - 2.4 mg/dL    Comment: Performed at [place] [address] [place], [place] [address]  Phosphorus     Status: None   Collection Time: 08/10/21  6:02 PM  Result Value Ref Range   Phosphorus 2.9 2.5 - 4.6 mg/dL    Comment: Performed at [facility], [place] [address], [place], [place] [address]  Troponin I  (High Sensitivity)     Status: Abnormal   Collection Time: 08/10/21 11:26 PM  Result Value Ref Range   Troponin I (High Sensitivity) 27 (H) <18 ng/L    Comment: (NOTE) Elevated high sensitivity troponin I (hsTnI) values and significant  changes across serial measurements may suggest ACS but many other  chronic and acute conditions are known to elevate hsTnI results.  Refer to the "Links" section for chest pain algorithms and additional  guidance. Performed at [facility], [place] [address], [place], [place] [address]     ECG   N/A  Telemetry   Afib with slow ventricular response, PVC's - Personally Reviewed  Radiology    DG Chest 2 View  Result Date: 08/10/2021 CLINICAL DATA:  Per triage notes: "Patient with history of afib sent to ED for further evaluation after trying multiple dose adjustments at home of diltiazem, but even after completing stopping the medication patient's heart rate still 35-45 EXAM: CHEST - 2 VIEW COMPARISON:  12/09/2014 FINDINGS: Coarse bilateral micronodular interstitial opacities involving bases more than apices, progressive since previous exam. No confluent airspace disease. Mild pulmonary hyperinflation. Heart size and mediastinal contours are within normal limits. Aortic Atherosclerosis (ICD10-170.0). No effusion. Visualized bones unremarkable. IMPRESSION: 1. Progressive probable chronic fibrotic changes involving the bases more than apices. No definite acute findings. Electronically Signed   By: Lucrezia Europe M.D.   On: 08/10/2021 16:57    Cardiac Studies   None  Assessment   Principal Problem:   Atrial fibrillation with slow ventricular response (HCC) Active Problems:   Bradycardia   Plan   Mrs. Cardy continues in afib with slow ventricular response. She has been off of all AVN blocking meds for more than 2 weeks - she is symptomatic. Asked EP to consult as pacemaker is indicated. She indicated willingness to proceed.  Time Spent Directly  with Patient:  I have spent a total of 25 minutes with the patient reviewing hospital notes, telemetry, EKGs, labs and examining the patient as well as establishing an assessment and plan that was discussed personally with the patient.  > 50% of time was spent in direct patient care.  Length  of Stay:  LOS: 1 day   Pixie Casino, MD, [facility], [place] Director of the Advanced Lipid Disorders &  Cardiovascular Risk Reduction Clinic Diplomate of the American Board of Clinical Lipidology Attending Cardiologist  Direct Dial: [phone]  Fax: [phone]  Website:  [email or web address] 08/11/2021, 9:21 AM

## 2021-08-11 NOTE — Plan of Care (Signed)

## 2021-08-11 NOTE — Consult Note (Addendum)
Cardiology Consultation:   Patient ID: Ashlee Warren MRN: [id number]; DOB: 04-29-1928  Admit date: 08/10/2021 Date of Consult: 08/11/2021  PCP:  Tonia Ghent, MD   [facility] Providers Cardiologist:  Jenkins Rouge, MD   {  Patient Profile:   Ashlee Warren is a 87 y.o. female with a hx of AFib (permanent), HTN,  who is being seen 08/11/2021 for the evaluation of symptomatic bradycardia at the request of Dr. Debara Pickett.  History of Present Illness:   Ms. Bedonie last saw [name] Aug 2022, discussed that historically was on Flecainide for rhythm control though stopped 2/2 widening of her QRS (this seems to be very remotely).  In the last several weeks or longer they have noticed that her HR when checking her BP was low perhaps 40's and reached out to PMD whoe over a period of time has been reducing her diltiazem dose, ultimately to a stop 2 weeks ago. Despite stopping the medicine, she has remained more fatigued, taking naps, and some lightheaded spells that have been upon standing, but new. In discussion with the PMD yesterday with ongoing reports of low HRs and some symptoms advised to the ER  HRs here have maintained 30's mostly some into 40 range with her baseline RBBB, LAD  LABS K+ 3.8 and 4.0 BUN/Creat 27/1.07 Mag 2.5 HS Trop 20, 27 WBC 8.5 H/H 12/40 Plts 293  TSH 07/21/21 2.04   Past Medical History:  Diagnosis Date   Atrial fibrillation ([place])    Dysrhythmia    afib   Essential hypertension     Past Surgical History:  Procedure Laterality Date   BREAST BIOPSY     left   CARDIOVERSION N/A 03/11/2013   Procedure: CARDIOVERSION;  Surgeon: Josue Hector, MD;  Location: [place];  Service: Cardiovascular;  Laterality: N/A;   CATARACT EXTRACTION, BILATERAL  2000   CYSTOSCOPY/URETEROSCOPY/HOLMIUM LASER/STENT PLACEMENT Right 09/16/2020   Procedure: CYSTOSCOPY, RETROGRADE PYELOGRAM, URETEROSCOPY, STENT PLACEMENT;  Surgeon: Janith Lima, MD;  Location: [facility];  Service: Urology;  Laterality: Right;  ONLY NEEDS 45 MIN   EYE SURGERY     TONSILLECTOMY       Home Medications:  Prior to Admission medications   Medication Sig Start Date End Date Taking? Authorizing Provider  acetaminophen (TYLENOL) 325 MG tablet Take 650 mg by mouth every 6 (six) hours as needed for moderate pain or headache.   Yes [provider]  Multiple Vitamins-Minerals (PRESERVISION AREDS 2+MULTI VIT PO) Take 1 capsule by mouth in the morning and at bedtime.    Yes [provider]  naproxen sodium (ALEVE) 220 MG tablet Take 220 mg by mouth daily as needed (lock jaw).   Yes [provider]  Rivaroxaban (XARELTO) 15 MG TABS tablet TAKE 1 TABLET (15 MG TOTAL) BY MOUTH DAILY WITH SUPPER. Patient taking differently: Take 15 mg by mouth daily with supper. 07/21/21  Yes Tonia Ghent, MD  diltiazem (CARDIZEM) 30 MG tablet Held as of 07/28/21 Patient not taking: Reported on 08/10/2021 07/28/21   Tonia Ghent, MD    Inpatient Medications: Scheduled Meds:  Continuous Infusions:  PRN Meds: acetaminophen, hydrALAZINE, ondansetron (ZOFRAN) IV  Allergies:    Allergies  Allergen Reactions   Penicillins Rash   Sulfonamide Derivatives Rash   Tamsulosin Swelling    Swollen Lips    Social History:   Social History   Socioeconomic History   Marital status: Widowed    Spouse name: Not on file   Number of  children: Not on file   Years of education: Not on file   Highest education level: Not on file  Occupational History   Not on file  Tobacco Use   Smoking status: Never   Smokeless tobacco: Never  Vaping Use   Vaping Use: Never used  Substance and Sexual Activity   Alcohol use: No   Drug use: No   Sexual activity: Not Currently    Birth control/protection: None  Other Topics Concern   Not on file  Social History Narrative   Widowed 2021, was married 11/05/47   Retired Therapist, sports, worked as Government social research officer, then worked with [employer],  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     Social Determinants of Health   Financial Resource Strain: Not on file  Food Insecurity: Not on file  Transportation Needs: Not on file  Physical Activity: Not on file  Stress: Not on file  Social Connections: Not on file  Intimate Partner Violence: Not on file    Family History:   Family History  Problem Relation Age of Onset   Cancer Mother        leukemia   Cancer Sister      ROS:  Please see the history of present illness.  All other ROS reviewed and negative.     Physical Exam/Data:   Vitals:   08/11/21 0245 08/11/21 0545 08/11/21 0915 08/11/21 0930  BP: (!) 168/45 (!) 180/63  (!) 151/97  Pulse: (!) 37 (!) 36 (!) 38 (!) 37  Resp: 12 12 13 16   Temp:      TempSrc:      SpO2: 98% 100% 98% 96%   No intake or output data in the 24 hours ending 08/11/21 0953 Last 3 Weights 07/21/2021 05/24/2021 04/20/2021  Weight (lbs) 112 lb 113 lb 12.8 oz 116 lb  Weight (kg) 50.803 kg 51.619 kg 52.617 kg     There is no height or weight on file to calculate BMI.  General:  Well nourished, well developed, in no acute distress HEENT: normal Neck: no JVD Vascular: No carotid bruits; Distal pulses 2+ bilaterally Cardiac:  regular RR  no murmurs, gallops or rubs Lungs:  clear to auscultation bilaterally, no wheezing, rhonchi or rales  Abd: soft, nontender, no hepatomegaly  Ext: no edema Musculoskeletal:  No deformities, BUE and BLE strength normal and equal Skin: warm and dry  Neuro:  CNs 2-12 intact, no focal abnormalities noted  alert and oriented x 3  Psych:  Normal affect   EKG:  The EKG was personally reviewed and demonstrates:   AFib, CHB 42bpm, RBBB, LAD  Telemetry:  Telemetry was personally reviewed and demonstrates:   AFib 30's-40, intermittently with frequent PVCs  Relevant CV Studies:  09/30/2012: TTE Study Conclusions  - Left ventricle: The cavity size was normal. Wall thickness    was normal. Systolic function was normal. The  estimated    ejection fraction was in the range of 55% to 60%.  - Mitral valve: Mild regurgitation.   Laboratory Data:  High Sensitivity Troponin:   Recent Labs  Lab 08/10/21 1802 08/10/21 2326  TROPONINIHS 20* 27*     Chemistry Recent Labs  Lab 08/10/21 1635 08/10/21 1802  NA 139  --   K 4.0  --   CL 107  --   CO2 24  --   GLUCOSE 95  --   BUN 27*  --   CREATININE 1.07*  --   CALCIUM 9.6  --  MG  --  2.5*  GFRNONAA 49*  --   ANIONGAP 8  --     No results for input(s): PROT, ALBUMIN, AST, ALT, ALKPHOS, BILITOT in the last 168 hours. Lipids No results for input(s): CHOL, TRIG, HDL, LABVLDL, LDLCALC, CHOLHDL in the last 168 hours.  Hematology Recent Labs  Lab 08/10/21 1635  WBC 8.6  RBC 4.70  HGB 12.7  HCT 40.5  MCV 86.2  MCH 27.0  MCHC 31.4  RDW 14.1  PLT 293   Thyroid No results for input(s): TSH, FREET4 in the last 168 hours.  BNPNo results for input(s): BNP, PROBNP in the last 168 hours.  DDimer No results for input(s): DDIMER in the last 168 hours.   Radiology/Studies:  DG Chest 2 View  Result Date: 08/10/2021 CLINICAL DATA:  Per triage notes: "Patient with history of afib sent to ED for further evaluation after trying multiple dose adjustments at home of diltiazem, but even after completing stopping the medication patient's heart rate still 35-45 EXAM: CHEST - 2 VIEW COMPARISON:  12/09/2014 FINDINGS: Coarse bilateral micronodular interstitial opacities involving bases more than apices, progressive since previous exam. No confluent airspace disease. Mild pulmonary hyperinflation. Heart size and mediastinal contours are within normal limits. Aortic Atherosclerosis (ICD10-170.0). No effusion. Visualized bones unremarkable. IMPRESSION: 1. Progressive probable chronic fibrotic changes involving the bases more than apices. No definite acute findings. Electronically Signed   By: Lucrezia Europe M.D.   On: 08/10/2021 16:57     Assessment and Plan:   Symptomatic  bradycardia CHB Difficult to get a clear idea of symptoms from the patient She mentions some fleeting "spells" of lightheadedness that are new, she has not fainted or felt like fainting Her grandson that sees her every morning has noticed with the observation of the low HRs that she is not as spry and a little more "foggy" in her thinking as well.  She is taking naps that is also unusual for her, and just not quite herself   She has baseline conduction system disease EKG is AFib 42bpm, and regular, likely is CHB  I have discussed with the patient and her grandson at bedside recommend PPM Discussed the rational for PPM and the procedure, including risks/benefots They are both in agreement to proceed  Permanent Afib CHA2DS2Vasc is 4, on Xarelto outpatient, held on admission with likely need for pacing Dosed appropriately  at 15mg   HTN Has been off her dilt for a couple weeks Follow post pacing    Risk Assessment/Risk Scores:     For questions or updates, please contact [place] Please consult [email or web address] for contact info under    Signed, Baldwin Jamaica, PA-C  08/11/2021 9:53 AM   Complete heart block  Atrial fibrillation-permanent  Symptomatic bradycardia  Hypertension   New onset complete heart block with junctional escape but without chronotropic competence and symptomatic bradycardia.  Her CCB have been held for days and so there is no reversible cause  ( TSH normal 07/21/21)  The benefits and risks were reviewed including but not limited to death,  perforation, infection, lead dislodgement and device malfunction.  The patient understands agrees and is willing to proceed.  Single chamber pacing.    Anticipate observation overnight and resumption of her CCB post pacing

## 2021-08-11 NOTE — ED Notes (Signed)
Assisted pt with ambulating to the restroom. Pt reported some dizziness, however pt ambulated with no issue.

## 2021-08-11 NOTE — Discharge Instructions (Signed)
    Supplemental Discharge Instructions for  Pacemaker/Defibrillator Patients   Activity No heavy lifting or vigorous activity with your left/right arm for 6 to 8 weeks.  Do not raise your left/right arm above your head for one week.  Gradually raise your affected arm as drawn below.             08/16/21                      08/17/21                    08/18/21                  08/19/21 __  NO DRIVING (patient does not drive)  WOUND CARE Keep the wound area clean and dry.  Do not get this area wet , no showers for 24 hours; you may shower on  1030/22  . Dr. Caryl Comes used DERMABOND to close your wound.  DO NOT peel this off.  Do not rub/scrub the area, pat dry. No bandage is needed on the site.  DO  NOT apply any creams, oils, or ointments to the wound area. If you notice any drainage or discharge from the wound, any swelling or bruising at the site, or you develop a fever > 101? F after you are discharged home, call the office at once.  Special Instructions You are still able to use cellular telephones; use the ear opposite the side where you have your pacemaker/defibrillator.  Avoid carrying your cellular phone near your device. When traveling through airports, show security personnel your identification card to avoid being screened in the metal detectors.  Ask the security personnel to use the hand wand. Avoid arc welding equipment, MRI testing (magnetic resonance imaging), TENS units (transcutaneous nerve stimulators).  Call the office for questions about other devices. Avoid electrical appliances that are in poor condition or are not properly grounded. Microwave ovens are safe to be near or to operate.

## 2021-08-11 NOTE — Progress Notes (Signed)
  Echocardiogram 2D Echocardiogram has been performed.  Merrie Roof F 08/11/2021, 12:48 PM

## 2021-08-12 ENCOUNTER — Inpatient Hospital Stay (HOSPITAL_COMMUNITY): Payer: Medicare Other

## 2021-08-12 DIAGNOSIS — Z20822 Contact with and (suspected) exposure to covid-19: Secondary | ICD-10-CM | POA: Diagnosis not present

## 2021-08-12 DIAGNOSIS — F32A Depression, unspecified: Secondary | ICD-10-CM | POA: Diagnosis not present

## 2021-08-12 DIAGNOSIS — Z006 Encounter for examination for normal comparison and control in clinical research program: Secondary | ICD-10-CM | POA: Diagnosis not present

## 2021-08-12 DIAGNOSIS — I4891 Unspecified atrial fibrillation: Secondary | ICD-10-CM | POA: Diagnosis not present

## 2021-08-12 DIAGNOSIS — I442 Atrioventricular block, complete: Secondary | ICD-10-CM | POA: Diagnosis not present

## 2021-08-12 MED ORDER — DILTIAZEM HCL ER COATED BEADS 120 MG PO CP24: 120.0000 mg | ORAL_CAPSULE | Freq: Every day | ORAL | 11 refills | Status: DC

## 2021-08-12 NOTE — Progress Notes (Addendum)
DAILY PROGRESS NOTE   Patient Name: Ashlee Warren Date of Encounter: 08/12/2021 Cardiologist: Jenkins Rouge, MD  Chief Complaint   No complaints  Patient Profile   Ashlee Warren is a 87 y.o. female with permanent A. Fib (on Xarelto), hypertension, chronic right bundle branch block/left anterior fascicular block, depression who is being seen for the evaluation of bradycardia.     Subjective   S/P PPM, feeling good and anxious to go home  Objective   Vitals:   08/11/21 2000 08/11/21 2358 08/12/21 0401 08/12/21 0845  BP: (!) 159/46 (!) 149/49 (!) 169/56 (!) 123/31  Pulse: 62 60 (!) 59 60  Resp: 18 18 20 20   Temp: 98 F (36.7 C) (!) 97.5 F (36.4 C) 97.7 F (36.5 C) 97.8 F (36.6 C)  TempSrc:    Oral  SpO2: 98% 98% 97% 96%  Weight:   49.6 kg     Intake/Output Summary (Last 24 hours) at 08/12/2021 1002 Last data filed at 08/12/2021 0848 Gross per 24 hour  Intake 360 ml  Output --  Net 360 ml   Filed Weights   08/12/21 0401  Weight: 49.6 kg    Physical Exam  GEN: Well nourished, well developed in no acute distress HEENT: Normal NECK: No JVD; No carotid bruits LYMPHATICS: No lymphadenopathy CARDIAC:RRR, no murmurs, rubs, gallops RESPIRATORY:  Clear to auscultation without rales, wheezing or rhonchi  ABDOMEN: Soft, non-tender, non-distended MUSCULOSKELETAL:  No edema; No deformity  SKIN: Warm and dry.  Left upper chest wall pacer site clean and dry with no hematoma NEUROLOGIC:  Alert and oriented x 3 PSYCHIATRIC:  Normal affect   Inpatient Medications    Scheduled Meds:  diltiazem  60 mg Oral Q8H    Continuous Infusions:   PRN Meds: acetaminophen, hydrALAZINE, ondansetron (ZOFRAN) IV   Labs   Results for orders placed or performed during the hospital encounter of 08/10/21 (from the past 48 hour(s))  CBC with Differential     Status: None   Collection Time: 08/10/21  4:35 PM  Result Value Ref Range   WBC 8.6 4.0 - 10.5 K/uL   RBC 4.70 3.87 -  5.11 MIL/uL   Hemoglobin 12.7 12.0 - 15.0 g/dL   HCT 40.5 36.0 - 46.0 %   MCV 86.2 80.0 - 100.0 fL   MCH 27.0 26.0 - 34.0 pg   MCHC 31.4 30.0 - 36.0 g/dL   RDW 14.1 11.5 - 15.5 %   Platelets 293 150 - 400 K/uL   nRBC 0.0 0.0 - 0.2 %   Neutrophils Relative % 65 %   Neutro Abs 5.6 1.7 - 7.7 K/uL   Lymphocytes Relative 22 %   Lymphs Abs 1.9 0.7 - 4.0 K/uL   Monocytes Relative 11 %   Monocytes Absolute 1.0 0.1 - 1.0 K/uL   Eosinophils Relative 2 %   Eosinophils Absolute 0.1 0.0 - 0.5 K/uL   Basophils Relative 0 %   Basophils Absolute 0.0 0.0 - 0.1 K/uL   Immature Granulocytes 0 %   Abs Immature Granulocytes 0.01 0.00 - 0.07 K/uL    Comment: Performed at [facility], [address] [place], [place] [address]  Basic metabolic panel     Status: Abnormal   Collection Time: 08/10/21  4:35 PM  Result Value Ref Range   Sodium 139 135 - 145 mmol/L   Potassium 4.0 3.5 - 5.1 mmol/L   Chloride 107 98 - 111 mmol/L   CO2 24 22 - 32 mmol/L  Glucose, Bld 95 70 - 99 mg/dL    Comment: Glucose reference range applies only to samples taken after fasting for at least 8 hours.   BUN 27 (H) 8 - 23 mg/dL   Creatinine, Ser 1.07 (H) 0.44 - 1.00 mg/dL   Calcium 9.6 8.9 - 10.3 mg/dL   GFR, Estimated 49 (L) >60 mL/min    Comment: (NOTE) Calculated using the CKD-EPI Creatinine Equation (2021)    Anion gap 8 5 - 15    Comment: Performed at [facility] [address], [place], [place] [address]  Urine Culture     Status: Abnormal   Collection Time: 08/10/21  5:39 PM   Specimen: Urine, Clean Catch  Result Value Ref Range   Specimen Description URINE, CLEAN CATCH    Special Requests NONE    Culture (A)     <10,000 COLONIES/mL INSIGNIFICANT GROWTH Performed at [facility], [address], [place], [place] [address]    Report Status 08/11/2021 FINAL   Resp Panel by RT-PCR (Flu A&B, Covid) Nasopharyngeal Swab     Status: None   Collection Time: 08/10/21  6:01 PM   Specimen:  Nasopharyngeal Swab; Nasopharyngeal(NP) swabs in vial transport medium  Result Value Ref Range   SARS Coronavirus 2 by RT PCR NEGATIVE NEGATIVE    Comment: (NOTE) SARS-CoV-2 target nucleic acids are NOT DETECTED.  The SARS-CoV-2 RNA is generally detectable in upper respiratory specimens during the acute phase of infection. The lowest concentration of SARS-CoV-2 viral copies this assay can detect is 138 copies/mL. A negative result does not preclude SARS-Cov-2 infection and should not be used as the sole basis for treatment or other patient management decisions. A negative result may occur with  improper specimen collection/handling, submission of specimen other than nasopharyngeal swab, presence of viral mutation(s) within the areas targeted by this assay, and inadequate number of viral copies(<138 copies/mL). A negative result must be combined with clinical observations, patient history, and epidemiological information. The expected result is Negative.  Fact Sheet for Patients:  EntrepreneurPulse.com.au  Fact Sheet for Healthcare Providers:  IncredibleEmployment.be  This test is no t yet approved or cleared by the [place] FDA and  has been authorized for detection and/or diagnosis of SARS-CoV-2 by FDA under an Emergency Use Authorization (EUA). This EUA will remain  in effect (meaning this test can be used) for the duration of the COVID-19 declaration under Section 564(b)(1) of the Act, 21 U.S.C.section 360bbb-3(b)(1), unless the authorization is terminated  or revoked sooner.       Influenza A by PCR NEGATIVE NEGATIVE   Influenza B by PCR NEGATIVE NEGATIVE    Comment: (NOTE) The Xpert Xpress SARS-CoV-2/FLU/RSV plus assay is intended as an aid in the diagnosis of influenza from Nasopharyngeal swab specimens and should not be used as a sole basis for treatment. Nasal washings and aspirates are unacceptable for Xpert Xpress  SARS-CoV-2/FLU/RSV testing.  Fact Sheet for Patients: EntrepreneurPulse.com.au  Fact Sheet for Healthcare Providers: IncredibleEmployment.be  This test is not yet approved or cleared by the [place] FDA and has been authorized for detection and/or diagnosis of SARS-CoV-2 by FDA under an Emergency Use Authorization (EUA). This EUA will remain in effect (meaning this test can be used) for the duration of the COVID-19 declaration under Section 564(b)(1) of the Act, 21 U.S.C. section 360bbb-3(b)(1), unless the authorization is terminated or revoked.  Performed at [facility], [place] [address] [place], [place] [address]   Urinalysis, Routine  w reflex microscopic Nasopharyngeal Swab     Status: Abnormal   Collection Time: 08/10/21  6:01 PM  Result Value Ref Range   Color, Urine YELLOW YELLOW   APPearance HAZY (A) CLEAR   Specific Gravity, Urine 1.019 1.005 - 1.030   pH 5.0 5.0 - 8.0   Glucose, UA NEGATIVE NEGATIVE mg/dL   Hgb urine dipstick NEGATIVE NEGATIVE   Bilirubin Urine NEGATIVE NEGATIVE   Ketones, ur 5 (A) NEGATIVE mg/dL   Protein, ur 30 (A) NEGATIVE mg/dL   Nitrite NEGATIVE NEGATIVE   Leukocytes,Ua NEGATIVE NEGATIVE   RBC / HPF 0-5 0 - 5 RBC/hpf   WBC, UA 0-5 0 - 5 WBC/hpf   Bacteria, UA NONE SEEN NONE SEEN   Squamous Epithelial / LPF 0-5 0 - 5   Hyaline Casts, UA PRESENT     Comment: Performed at [facility], [address], [place], [place] [address]  Troponin I (High Sensitivity)     Status: Abnormal   Collection Time: 08/10/21  6:02 PM  Result Value Ref Range   Troponin I (High Sensitivity) 20 (H) <18 ng/L    Comment: (NOTE) Elevated high sensitivity troponin I (hsTnI) values and significant  changes across serial measurements may suggest ACS but many other  chronic and acute conditions are known to elevate hsTnI results.  Refer to the "Links" section for chest pain algorithms and additional   guidance. Performed at [facility], [place] [address] [place], [place] [address]   Magnesium     Status: Abnormal   Collection Time: 08/10/21  6:02 PM  Result Value Ref Range   Magnesium 2.5 (H) 1.7 - 2.4 mg/dL    Comment: Performed at [facility] [address], [place], [place] [address]  Phosphorus     Status: None   Collection Time: 08/10/21  6:02 PM  Result Value Ref Range   Phosphorus 2.9 2.5 - 4.6 mg/dL    Comment: Performed at [facility], [place] [address], [place], [place] [address]  Troponin I (High Sensitivity)     Status: Abnormal   Collection Time: 08/10/21 11:26 PM  Result Value Ref Range   Troponin I (High Sensitivity) 27 (H) <18 ng/L    Comment: (NOTE) Elevated high sensitivity troponin I (hsTnI) values and significant  changes across serial measurements may suggest ACS but many other  chronic and acute conditions are known to elevate hsTnI results.  Refer to the "Links" section for chest pain algorithms and additional  guidance. Performed at [facility], [place] [address] [place], [place] [address]     ECG   N/A  Telemetry   Atrial fibrillation with SVR- Personally Reviewed  Radiology    DG Chest 2 View  Result Date: 08/10/2021 CLINICAL DATA:  Per triage notes: "Patient with history of afib sent to ED for further evaluation after trying multiple dose adjustments at home of diltiazem, but even after completing stopping the medication patient's heart rate still 35-45 EXAM: CHEST - 2 VIEW COMPARISON:  12/09/2014 FINDINGS: Coarse bilateral micronodular interstitial opacities involving bases more than apices, progressive since previous exam. No confluent airspace disease. Mild pulmonary hyperinflation. Heart size and mediastinal contours are within normal limits. Aortic Atherosclerosis (ICD10-170.0). No effusion. Visualized bones unremarkable. IMPRESSION: 1. Progressive probable chronic fibrotic changes involving the bases more than  apices. No definite acute findings. Electronically Signed   By: Lucrezia Europe M.D.   On: 08/10/2021 16:57   ECHOCARDIOGRAM COMPLETE  Result Date: 08/11/2021    ECHOCARDIOGRAM REPORT   Patient Name:   Ashlee Warren Date of Exam: 08/11/2021 Medical Rec #:  [id number]       Height:       62.0 in Accession #:    [id number]      Weight:       112.0 lb Date of Birth:  Mar 01, 1928        BSA:          1.494 m Patient Age:    23 years        BP:           191/49 mmHg Patient Gender: F               HR:           37 bpm. Exam Location:  Inpatient Procedure: 2D Echo, Cardiac Doppler and Color Doppler Indications:    for pacemaker today  History:        Patient has prior history of Echocardiogram examinations.                 Abnormal ECG; Arrythmias:Atrial Fibrillation, Tachycardia and                 Bradycardia.  Sonographer:    Merrie Roof RDCS Referring Phys: [place]  1. Left ventricular ejection fraction, by estimation, is 60 to 65%. The left ventricle has normal function. The left ventricle has no regional wall motion abnormalities. Left ventricular diastolic function could not be evaluated.  2. Right ventricular systolic function is normal. The right ventricular size is normal. There is moderately elevated pulmonary artery systolic pressure. The estimated right ventricular systolic pressure is 00.8 mmHg.  3. Left atrial size was severely dilated.  4. Right atrial size was mildly dilated.  5. The mitral valve is normal in structure. Mild mitral valve regurgitation.  6. Tricuspid valve regurgitation is mild to moderate.  7. The aortic valve is tricuspid. Aortic valve regurgitation is trivial. FINDINGS  Left Ventricle: Left ventricular ejection fraction, by estimation, is 60 to 65%. The left ventricle has normal function. The left ventricle has no regional wall motion abnormalities. The left ventricular internal cavity size was normal in size. There is  no left ventricular hypertrophy. Left  ventricular diastolic function could not be evaluated due to atrial fibrillation. Left ventricular diastolic function could not be evaluated. Right Ventricle: The right ventricular size is normal. Right vetricular wall thickness was not well visualized. Right ventricular systolic function is normal. There is moderately elevated pulmonary artery systolic pressure. The tricuspid regurgitant velocity is 3.58 m/s, and with an assumed right atrial pressure of 3 mmHg, the estimated right ventricular systolic pressure is 67.6 mmHg. Left Atrium: Left atrial size was severely dilated. Right Atrium: Right atrial size was mildly dilated. Pericardium: There is no evidence of pericardial effusion. Mitral Valve: The mitral valve is normal in structure. Mild mitral valve regurgitation. Tricuspid Valve: The tricuspid valve is normal in structure. Tricuspid valve regurgitation is mild to moderate. Aortic Valve: The aortic valve is tricuspid. Aortic valve regurgitation is trivial. Aortic valve mean gradient measures 5.0 mmHg. Aortic valve peak gradient measures 8.8 mmHg. Aortic valve area, by VTI measures 1.70 cm. Pulmonic Valve: The pulmonic valve was grossly normal. Pulmonic valve regurgitation is trivial. Aorta: The aortic root and ascending aorta are structurally normal, with no evidence of dilitation. IAS/Shunts: The atrial septum is grossly normal.  LEFT VENTRICLE PLAX 2D LVIDd:  4.40 cm LVIDs:         2.80 cm LV PW:         0.90 cm LV IVS:        0.80 cm LVOT diam:     1.80 cm LV SV:         71 LV SV Index:   48 LVOT Area:     2.54 cm  RIGHT VENTRICLE             IVC RV Basal diam:  4.30 cm     IVC diam: 1.70 cm RV Mid diam:    3.60 cm RV S prime:     13.20 cm/s TAPSE (M-mode): 1.3 cm LEFT ATRIUM              Index        RIGHT ATRIUM           Index LA diam:        3.90 cm  2.61 cm/m   RA Area:     20.90 cm LA Vol (A2C):   109.0 ml 72.95 ml/m  RA Volume:   62.10 ml  41.56 ml/m LA Vol (A4C):   57.2 ml  38.28 ml/m  LA Biplane Vol: 79.3 ml  53.07 ml/m  AORTIC VALVE AV Area (Vmax):    1.62 cm AV Area (Vmean):   1.69 cm AV Area (VTI):     1.70 cm AV Vmax:           148.00 cm/s AV Vmean:          104.000 cm/s AV VTI:            0.417 m AV Peak Grad:      8.8 mmHg AV Mean Grad:      5.0 mmHg LVOT Vmax:         94.00 cm/s LVOT Vmean:        69.000 cm/s LVOT VTI:          0.279 m LVOT/AV VTI ratio: 0.67  AORTA Ao Root diam: 2.60 cm Ao Asc diam:  2.70 cm TRICUSPID VALVE TR Peak grad:   51.3 mmHg TR Vmax:        358.00 cm/s  SHUNTS Systemic VTI:  0.28 m Systemic Diam: 1.80 cm Mertie Moores MD Electronically signed by Mertie Moores MD Signature Date/Time: 08/11/2021/4:17:22 PM    Final     Cardiac Studies   None  Assessment   Principal Problem:   Atrial fibrillation with slow ventricular response (HCC) Active Problems:   Bradycardia   Plan   TSH is normal She has symptomatic bradycardia  S/P single chamber PPM yesterday EP to see today with likely discharge Will order PA and lat Cxray  Time Spent Directly with Patient:  Fransico Him 08/12/2021, 10:02 AM

## 2021-08-12 NOTE — Progress Notes (Signed)
Progress Note  Patient Name: Ashlee Warren Date of Encounter: 08/12/2021  Primary Cardiologist: Jenkins Rouge, MD   Subjective   No chest pain or sob after PPM insertion. Feels well and wants to go home.  Inpatient Medications    Scheduled Meds:  diltiazem  60 mg Oral Q8H   Continuous Infusions:  PRN Meds: acetaminophen, hydrALAZINE, ondansetron (ZOFRAN) IV   Vital Signs    Vitals:   08/11/21 2000 08/11/21 2358 08/12/21 0401 08/12/21 0845  BP: (!) 159/46 (!) 149/49 (!) 169/56 (!) 123/31  Pulse: 62 60 (!) 59 60  Resp: 18 18 20 20   Temp: 98 F (36.7 C) (!) 97.5 F (36.4 C) 97.7 F (36.5 C) 97.8 F (36.6 C)  TempSrc:    Oral  SpO2: 98% 98% 97% 96%  Weight:   49.6 kg     Intake/Output Summary (Last 24 hours) at 08/12/2021 1143 Last data filed at 08/12/2021 0848 Gross per 24 hour  Intake 360 ml  Output --  Net 360 ml   Filed Weights   08/12/21 0401  Weight: 49.6 kg    Telemetry    Atrial fib with ventricular pacing - Personally Reviewed  ECG    Atrial fib with ventricular pacing. - Personally Reviewed  Physical Exam   GEN: No acute distress.   Neck: No JVD Cardiac: RRR, no murmurs, rubs, or gallops.  Respiratory: Clear to auscultation bilaterally. No hematoma. GI: Soft, nontender, non-distended  MS: No edema; No deformity. Neuro:  Nonfocal  Psych: Normal affect   Labs    Chemistry Recent Labs  Lab 08/10/21 1635  NA 139  K 4.0  CL 107  CO2 24  GLUCOSE 95  BUN 27*  CREATININE 1.07*  CALCIUM 9.6  GFRNONAA 49*  ANIONGAP 8     Hematology Recent Labs  Lab 08/10/21 1635  WBC 8.6  RBC 4.70  HGB 12.7  HCT 40.5  MCV 86.2  MCH 27.0  MCHC 31.4  RDW 14.1  PLT 293    Cardiac EnzymesNo results for input(s): TROPONINI in the last 168 hours. No results for input(s): TROPIPOC in the last 168 hours.   BNPNo results for input(s): BNP, PROBNP in the last 168 hours.   DDimer No results for input(s): DDIMER in the last 168 hours.    Radiology    DG Chest 2 View  Result Date: 08/12/2021 CLINICAL DATA:  Arrhythmia EXAM: CHEST - 2 VIEW COMPARISON:  Two days ago FINDINGS: Interstitial lung disease with coarse interstitial opacities in the subpleural and basal lungs. There is no edema, consolidation, effusion, or pneumothorax. Normal heart size and mediastinal contours. Single chamber pacer into the right ventricle. IMPRESSION: Interstitial lung disease.  No acute superimposed finding. Electronically Signed   By: Jorje Guild M.D.   On: 08/12/2021 11:27   DG Chest 2 View  Result Date: 08/10/2021 CLINICAL DATA:  Per triage notes: "Patient with history of afib sent to ED for further evaluation after trying multiple dose adjustments at home of diltiazem, but even after completing stopping the medication patient's heart rate still 35-45 EXAM: CHEST - 2 VIEW COMPARISON:  12/09/2014 FINDINGS: Coarse bilateral micronodular interstitial opacities involving bases more than apices, progressive since previous exam. No confluent airspace disease. Mild pulmonary hyperinflation. Heart size and mediastinal contours are within normal limits. Aortic Atherosclerosis (ICD10-170.0). No effusion. Visualized bones unremarkable. IMPRESSION: 1. Progressive probable chronic fibrotic changes involving the bases more than apices. No definite acute findings. Electronically Signed   By: Lucrezia Europe  M.D.   On: 08/10/2021 16:57   ECHOCARDIOGRAM COMPLETE  Result Date: 08/11/2021    ECHOCARDIOGRAM REPORT   Patient Name:   Ashlee Warren Date of Exam: 08/11/2021 Medical Rec #:  [id number]       Height:       62.0 in Accession #:    [id number]      Weight:       112.0 lb Date of Birth:  19-Apr-1928        BSA:          1.494 m Patient Age:    87 years        BP:           191/49 mmHg Patient Gender: F               HR:           37 bpm. Exam Location:  Inpatient Procedure: 2D Echo, Cardiac Doppler and Color Doppler Indications:    for pacemaker today  History:         Patient has prior history of Echocardiogram examinations.                 Abnormal ECG; Arrythmias:Atrial Fibrillation, Tachycardia and                 Bradycardia.  Sonographer:    Merrie Roof RDCS Referring Phys: [place]  1. Left ventricular ejection fraction, by estimation, is 60 to 65%. The left ventricle has normal function. The left ventricle has no regional wall motion abnormalities. Left ventricular diastolic function could not be evaluated.  2. Right ventricular systolic function is normal. The right ventricular size is normal. There is moderately elevated pulmonary artery systolic pressure. The estimated right ventricular systolic pressure is 95.0 mmHg.  3. Left atrial size was severely dilated.  4. Right atrial size was mildly dilated.  5. The mitral valve is normal in structure. Mild mitral valve regurgitation.  6. Tricuspid valve regurgitation is mild to moderate.  7. The aortic valve is tricuspid. Aortic valve regurgitation is trivial. FINDINGS  Left Ventricle: Left ventricular ejection fraction, by estimation, is 60 to 65%. The left ventricle has normal function. The left ventricle has no regional wall motion abnormalities. The left ventricular internal cavity size was normal in size. There is  no left ventricular hypertrophy. Left ventricular diastolic function could not be evaluated due to atrial fibrillation. Left ventricular diastolic function could not be evaluated. Right Ventricle: The right ventricular size is normal. Right vetricular wall thickness was not well visualized. Right ventricular systolic function is normal. There is moderately elevated pulmonary artery systolic pressure. The tricuspid regurgitant velocity is 3.58 m/s, and with an assumed right atrial pressure of 3 mmHg, the estimated right ventricular systolic pressure is 93.2 mmHg. Left Atrium: Left atrial size was severely dilated. Right Atrium: Right atrial size was mildly dilated. Pericardium:  There is no evidence of pericardial effusion. Mitral Valve: The mitral valve is normal in structure. Mild mitral valve regurgitation. Tricuspid Valve: The tricuspid valve is normal in structure. Tricuspid valve regurgitation is mild to moderate. Aortic Valve: The aortic valve is tricuspid. Aortic valve regurgitation is trivial. Aortic valve mean gradient measures 5.0 mmHg. Aortic valve peak gradient measures 8.8 mmHg. Aortic valve area, by VTI measures 1.70 cm. Pulmonic Valve: The pulmonic valve was grossly normal. Pulmonic valve regurgitation is trivial. Aorta: The aortic root and ascending aorta are structurally normal, with no evidence of dilitation. IAS/Shunts: The atrial septum  is grossly normal.  LEFT VENTRICLE PLAX 2D LVIDd:         4.40 cm LVIDs:         2.80 cm LV PW:         0.90 cm LV IVS:        0.80 cm LVOT diam:     1.80 cm LV SV:         71 LV SV Index:   48 LVOT Area:     2.54 cm  RIGHT VENTRICLE             IVC RV Basal diam:  4.30 cm     IVC diam: 1.70 cm RV Mid diam:    3.60 cm RV S prime:     13.20 cm/s TAPSE (M-mode): 1.3 cm LEFT ATRIUM              Index        RIGHT ATRIUM           Index LA diam:        3.90 cm  2.61 cm/m   RA Area:     20.90 cm LA Vol (A2C):   109.0 ml 72.95 ml/m  RA Volume:   62.10 ml  41.56 ml/m LA Vol (A4C):   57.2 ml  38.28 ml/m LA Biplane Vol: 79.3 ml  53.07 ml/m  AORTIC VALVE AV Area (Vmax):    1.62 cm AV Area (Vmean):   1.69 cm AV Area (VTI):     1.70 cm AV Vmax:           148.00 cm/s AV Vmean:          104.000 cm/s AV VTI:            0.417 m AV Peak Grad:      8.8 mmHg AV Mean Grad:      5.0 mmHg LVOT Vmax:         94.00 cm/s LVOT Vmean:        69.000 cm/s LVOT VTI:          0.279 m LVOT/AV VTI ratio: 0.67  AORTA Ao Root diam: 2.60 cm Ao Asc diam:  2.70 cm TRICUSPID VALVE TR Peak grad:   51.3 mmHg TR Vmax:        358.00 cm/s  SHUNTS Systemic VTI:  0.28 m Systemic Diam: 1.80 cm Mertie Moores MD Electronically signed by Mertie Moores MD Signature Date/Time:  08/11/2021/4:17:22 PM    Final     Cardiac Studies   See above  Patient Profile     87 y.o. female admitted with CHB and syncope, s/p PPM insertion  Assessment & Plan    PPM - interrogation of her single chamber under my direction demonstrates normal PPM function.  Atrial fib - her rates are well controlled.  Coags - she will go back on the xarelto on Wednesday.   For questions or updates, please contact [place] Please consult [email or web address] for contact info under Cardiology/STEMI.      Signed, Cristopher Peru, MD  08/12/2021, 11:43 AM

## 2021-08-12 NOTE — Progress Notes (Signed)
Patient discharged: Home with family.  Left via: Wheelchair  Discharge paperwork reviewed and given: to patient and family. Teach back completed. IV and telemetry disconnected. Belongings given to patient.  

## 2021-08-12 NOTE — Discharge Summary (Addendum)
Discharge Summary    Patient ID: Ashlee Warren MRN: [id number]; DOB: Jul 14, 1928  Admit date: 08/10/2021 Discharge date: 08/12/2021  PCP:  Tonia Ghent, MD   [facility] Providers Cardiologist:  Jenkins Rouge, MD  Electrophysiologist:  Virl Axe, MD  {    Discharge Diagnoses    Principal Problem:   Atrial fibrillation with slow ventricular response [facility]) Active Problems:   Bradycardia   HTN   Diagnostic Studies/Procedures     Echo 08/11/21  1. Left ventricular ejection fraction, by estimation, is 60 to 65%. The  left ventricle has normal function. The left ventricle has no regional  wall motion abnormalities. Left ventricular diastolic function could not  be evaluated.   2. Right ventricular systolic function is normal. The right ventricular  size is normal. There is moderately elevated pulmonary artery systolic  pressure. The estimated right ventricular systolic pressure is 61.6 mmHg.   3. Left atrial size was severely dilated.   4. Right atrial size was mildly dilated.   5. The mitral valve is normal in structure. Mild mitral valve  regurgitation.   6. Tricuspid valve regurgitation is mild to moderate.   7. The aortic valve is tricuspid. Aortic valve regurgitation is trivial.    Preop WV:PXTGGYIR heart block  Postop Dx same/  Cx: none apparent   Procedure single pacemaker implantation Contrast venogram  After routine prep and drape, lidocaine was infiltrated in the prepectoral subclavicular region on the left side an incision was made and carried down to later the prepectoral fascia using electrocautery and sharp dissection a pocket was formed similarly. Hemostasis was obtained.  After this, we turned our attention to gaining accessm to the extrathoracic,left subclavian vein. This was accomplished with difficulty and so venogram was done, proceeded then without the aspiration of air or puncture of the artery. A single micropuncture Venipuncture was  accomplished; guidewire was placed and a 7 French sheath placed  through which was passed an Abbott   ventricular lead serial [id number]  .  The ventricular lead was manipulated to the right ventricular apex with a bipolar R wave was 2.3,  the pacing impedance was 714, the threshold was 0.9 @ 0.5 msec     The lead was affixed to the prepectoral fascia and attached to a Medtronic MRI compatible  pulse generator  Serial number [id number] .  Hemostasis was obtained. The pocket was copiously irrigated with antibiotic containing saline solution. The leads and the pulse generator were placed in the pocket and affixed to the prepectoral fascia. Surgicell placed in pocket .   The wound iwas then closed in 2 layers in normal fashion. The wound was washed dried and Dermabond dressing was applied   Needle count, sponge count  and instrument counts were correct at the end of the procedure     History of Present Illness     Ashlee Warren is a 87 y.o. female with  permanent A. 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  Rried on flecainide but stopped with widening QRS in past. Has been on rate control strategy.   Presented to the ED on 10/27 with weakness and bradycardia. Notes indicate she was instructed to stop her Diltiazem back on 07/28/21 in the setting of bradycardia by her PCP. She called her PCP today and instructed to present to the ED.    Labs on admission showed Na+ 139, K+ 4.0, Cr 1.07, WBC  8.6, Hgb 12.7, Mag 2.5, hsTn 20. EKG showed junctional bradycardia 42 bpm, RBBB/LAFB. CXR with chronic fibrotic changes in the bases. Noted in afib with VR of 35-40 on telemetry in the ED. She denies any syncope PTA, but has had some intermittent dizziness. No chest pain.    Hospital Course     Consultants: EP    Heart rate remained in 30s with PVCs on telemetry. She  had some dizziness while admitted. She had baseline conduction abnormality and seen by EP.  Echo with preserved LVEF. TSH normal. 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 No immediate or overnight complications. Chest x-ray without pneumothorax. Device check normal. She was placed on Cardizem CD 120mg  qd. Plan to resume Xarelto on 08/16/21.    The patient been seen by Dr. Lovena Le today and deemed ready for discharge home. All follow-up appointments have been scheduled. Discharge medications are listed below.     Did the patient have an acute coronary syndrome (MI, NSTEMI, STEMI, etc) this admission?:  No                               Did the patient have a percutaneous coronary intervention (stent / angioplasty)?:  No.      Discharge Vitals Blood pressure (!) 123/31, pulse 60, temperature 97.8 F (36.6 C), temperature source Oral, resp. rate 20, weight 49.6 kg, SpO2 96 %.  Filed Weights   08/12/21 0401  Weight: 49.6 kg    Labs & Radiologic Studies    CBC Recent Labs    08/10/21 1635  WBC 8.6  NEUTROABS 5.6  HGB 12.7  HCT 40.5  MCV 86.2  PLT 093   Basic Metabolic Panel Recent Labs    08/10/21 1635 08/10/21 1802  NA 139  --   K 4.0  --   CL 107  --   CO2 24  --   GLUCOSE 95  --   BUN 27*  --   CREATININE 1.07*  --   CALCIUM 9.6  --   MG  --  2.5*  PHOS  --  2.9    High Sensitivity Troponin:   Recent Labs  Lab 08/10/21 1802 08/10/21 2326  TROPONINIHS 20* 27*    _____________  DG Chest 2 View  Result Date: 08/10/2021 CLINICAL DATA:  Per triage notes: "Patient with history of afib sent to ED for further evaluation after trying multiple dose adjustments at home of diltiazem, but even after completing stopping the medication patient's heart rate still 35-45 EXAM: CHEST - 2 VIEW COMPARISON:  12/09/2014  FINDINGS: Coarse bilateral micronodular interstitial opacities involving bases more than apices, progressive since previous exam. No confluent airspace disease. Mild pulmonary hyperinflation. Heart size and mediastinal contours are within normal limits. Aortic Atherosclerosis (ICD10-170.0). No effusion. Visualized bones unremarkable. IMPRESSION: 1. Progressive probable chronic fibrotic changes involving the bases more than apices. No definite acute findings. Electronically Signed   By: Lucrezia Europe M.D.   On: 08/10/2021 16:57   ECHOCARDIOGRAM COMPLETE  Result Date: 08/11/2021    ECHOCARDIOGRAM REPORT   Patient Name:   Ashlee Warren Date of Exam: 08/11/2021 Medical Rec #:  [id number]       Height:       62.0 in Accession #:    [id number]      Weight:  112.0 lb Date of Birth:  21-Jul-1928        BSA:          1.494 m Patient Age:    79 years        BP:           191/49 mmHg Patient Gender: F               HR:           37 bpm. Exam Location:  Inpatient Procedure: 2D Echo, Cardiac Doppler and Color Doppler Indications:    for pacemaker today  History:        Patient has prior history of Echocardiogram examinations.                 Abnormal ECG; Arrythmias:Atrial Fibrillation, Tachycardia and                 Bradycardia.  Sonographer:    Merrie Roof RDCS Referring Phys: [place]  1. Left ventricular ejection fraction, by estimation, is 60 to 65%. The left ventricle has normal function. The left ventricle has no regional wall motion abnormalities. Left ventricular diastolic function could not be evaluated.  2. Right ventricular systolic function is normal. The right ventricular size is normal. There is moderately elevated pulmonary artery systolic pressure. The estimated right ventricular systolic pressure is 21.1 mmHg.  3. Left atrial size was severely dilated.  4. Right atrial size was mildly dilated.  5. The mitral valve is normal in structure. Mild mitral valve regurgitation.  6.  Tricuspid valve regurgitation is mild to moderate.  7. The aortic valve is tricuspid. Aortic valve regurgitation is trivial. FINDINGS  Left Ventricle: Left ventricular ejection fraction, by estimation, is 60 to 65%. The left ventricle has normal function. The left ventricle has no regional wall motion abnormalities. The left ventricular internal cavity size was normal in size. There is  no left ventricular hypertrophy. Left ventricular diastolic function could not be evaluated due to atrial fibrillation. Left ventricular diastolic function could not be evaluated. Right Ventricle: The right ventricular size is normal. Right vetricular wall thickness was not well visualized. Right ventricular systolic function is normal. There is moderately elevated pulmonary artery systolic pressure. The tricuspid regurgitant velocity is 3.58 m/s, and with an assumed right atrial pressure of 3 mmHg, the estimated right ventricular systolic pressure is 94.1 mmHg. Left Atrium: Left atrial size was severely dilated. Right Atrium: Right atrial size was mildly dilated. Pericardium: There is no evidence of pericardial effusion. Mitral Valve: The mitral valve is normal in structure. Mild mitral valve regurgitation. Tricuspid Valve: The tricuspid valve is normal in structure. Tricuspid valve regurgitation is mild to moderate. Aortic Valve: The aortic valve is tricuspid. Aortic valve regurgitation is trivial. Aortic valve mean gradient measures 5.0 mmHg. Aortic valve peak gradient measures 8.8 mmHg. Aortic valve area, by VTI measures 1.70 cm. Pulmonic Valve: The pulmonic valve was grossly normal. Pulmonic valve regurgitation is trivial. Aorta: The aortic root and ascending aorta are structurally normal, with no evidence of dilitation. IAS/Shunts: The atrial septum is grossly normal.  LEFT VENTRICLE PLAX 2D LVIDd:         4.40 cm LVIDs:         2.80 cm LV PW:         0.90 cm LV IVS:        0.80 cm LVOT diam:     1.80 cm LV SV:  71 LV  SV Index:   48 LVOT Area:     2.54 cm  RIGHT VENTRICLE             IVC RV Basal diam:  4.30 cm     IVC diam: 1.70 cm RV Mid diam:    3.60 cm RV S prime:     13.20 cm/s TAPSE (M-mode): 1.3 cm LEFT ATRIUM              Index        RIGHT ATRIUM           Index LA diam:        3.90 cm  2.61 cm/m   RA Area:     20.90 cm LA Vol (A2C):   109.0 ml 72.95 ml/m  RA Volume:   62.10 ml  41.56 ml/m LA Vol (A4C):   57.2 ml  38.28 ml/m LA Biplane Vol: 79.3 ml  53.07 ml/m  AORTIC VALVE AV Area (Vmax):    1.62 cm AV Area (Vmean):   1.69 cm AV Area (VTI):     1.70 cm AV Vmax:           148.00 cm/s AV Vmean:          104.000 cm/s AV VTI:            0.417 m AV Peak Grad:      8.8 mmHg AV Mean Grad:      5.0 mmHg LVOT Vmax:         94.00 cm/s LVOT Vmean:        69.000 cm/s LVOT VTI:          0.279 m LVOT/AV VTI ratio: 0.67  AORTA Ao Root diam: 2.60 cm Ao Asc diam:  2.70 cm TRICUSPID VALVE TR Peak grad:   51.3 mmHg TR Vmax:        358.00 cm/s  SHUNTS Systemic VTI:  0.28 m Systemic Diam: 1.80 cm Mertie Moores MD Electronically signed by Mertie Moores MD Signature Date/Time: 08/11/2021/4:17:22 PM    Final    Disposition   Pt is being discharged home today in good condition.  Follow-up Plans & Appointments     Follow-up Information     [facility] up.   Specialty: Cardiology Why: 08/24/21 @ 9:20AM, wound check visit Contact information: [address], [address] [place]        Deboraha Sprang, MD Follow up.   Specialty: Cardiology Why: 11/14/21 @ 3:30PM Contact information: [address] [place] [address] [phone]                Discharge Instructions     Diet - low sodium heart healthy   Complete by: As directed    Discharge instructions   Complete by: As directed    Hold Xarelto and resume on Wednesday night 08/16/21   Increase activity slowly   Complete by: As directed        Discharge  Medications   Allergies as of 08/12/2021       Reactions   Penicillins Rash   Sulfonamide Derivatives Rash   Tamsulosin Swelling   Swollen Lips        Medication List     STOP taking these medications    diltiazem 30 MG tablet Commonly known as: Cardizem       TAKE these medications    acetaminophen 325 MG tablet Commonly known  as: TYLENOL Take 650 mg by mouth every 6 (six) hours as needed for moderate pain or headache.   diltiazem 120 MG 24 hr capsule Commonly known as: Cardizem CD Take 1 capsule (120 mg total) by mouth daily.   naproxen sodium 220 MG tablet Commonly known as: ALEVE Take 220 mg by mouth daily as needed (lock jaw).   PRESERVISION AREDS 2+MULTI VIT PO Take 1 capsule by mouth in the morning and at bedtime.   Rivaroxaban 15 MG Tabs tablet Commonly known as: Xarelto TAKE 1 TABLET (15 MG TOTAL) BY MOUTH DAILY WITH SUPPER. What changed:  how much to take how to take this when to take this additional instructions           Outstanding Labs/Studies   None Duration of Discharge Encounter   Greater than 30 minutes including physician time.  Jarrett Soho, PA 08/12/2021, 11:25 AM   EP Attending  Patient seen and examined. Agree with above. See my note as well. She is stable for DC home with usual followup.  Carleene Overlie Angle Dirusso,MD

## 2021-08-14 ENCOUNTER — Encounter (HOSPITAL_COMMUNITY): Payer: Self-pay | Admitting: Internal Medicine

## 2021-08-14 MED FILL — Lidocaine HCl Local Inj 1%: INTRAMUSCULAR | Qty: 20 | Status: AC

## 2021-08-14 MED FILL — Lidocaine HCl Local Inj 1%: INTRAMUSCULAR | Qty: 25 | Status: AC

## 2021-08-15 ENCOUNTER — Telehealth: Payer: Self-pay

## 2021-08-15 NOTE — Telephone Encounter (Signed)
Transition Care Management Follow-up Telephone Call Date of discharge and from where: 08/12/2021 / [facility]  How have you been since you were released from the hospital? " Doing ok per daughter, Casimira Sutphin" Any questions or concerns? Yes,  " family needs to look into having additional help at home with patient."  RNCM discussed embedded case management program with daughter.  Daughter interested in program for patient and requested referral to be made.   Items Reviewed: Did the pt receive and understand the discharge instructions provided? Yes  Medications obtained and verified? Yes  Other? No  Any new allergies since your discharge? No  Dietary orders reviewed? Yes Do you have support at home? Yes   Home Care and Equipment/Supplies: Were home health services ordered? no If so, what is the name of the agency? N/a  Has the agency set up a time to come to the patient's home? not applicable Were any new equipment or medical supplies ordered?  No What is the name of the medical supply agency? N/a Were you able to get the supplies/equipment? not applicable Do you have any questions related to the use of the equipment or supplies? No  Functional Questionnaire: (I = Independent and D = Dependent) ADLs: I  Bathing/Dressing- I  Meal Prep- I  Eating- I  Maintaining continence- I  Transferring/Ambulation- I  Managing Meds- I  Follow up appointments reviewed:  PCP Hospital f/u appt confirmed? No  Scheduled to see  Specialist Hospital f/u appt confirmed? Yes  Scheduled to see Cardiovascular clinic for wound check  on 08/24/2021 @ 9:20am. Are transportation arrangements needed? Yes  If their condition worsens, is the pt aware to call PCP or go to the Emergency Dept.? Yes Was the patient provided with contact information for the PCP's office or ED? Yes Was to pt encouraged to call back with questions or concerns? Yes   Quinn Plowman RN,BSN,CCM RN Case Manager [place] [phone]

## 2021-08-16 ENCOUNTER — Other Ambulatory Visit: Payer: Self-pay | Admitting: Family Medicine

## 2021-08-16 DIAGNOSIS — R001 Bradycardia, unspecified: Secondary | ICD-10-CM

## 2021-08-16 DIAGNOSIS — I4891 Unspecified atrial fibrillation: Secondary | ICD-10-CM

## 2021-08-17 ENCOUNTER — Telehealth: Payer: Self-pay | Admitting: *Deleted

## 2021-08-17 NOTE — Chronic Care Management (AMB) (Signed)
  Chronic Care Management   Note  08/17/2021 Name: Ashlee Warren MRN: [id number] DOB: [birth year]  Ashlee Warren is a 87 y.o. year old female who is a primary care patient of Tonia Ghent, MD. I reached out to Claybon Jabs by phone today in response to a referral sent by Ms. Ashlee Warren PCP.  Ms. Kludt was given information about Chronic Care Management services today including:  CCM service includes personalized support from designated clinical staff supervised by her physician, including individualized plan of care and coordination with other care providers 24/7 contact phone numbers for assistance for urgent and routine care needs. Service will only be billed when office clinical staff spend 20 minutes or more in a month to coordinate care. Only one practitioner may furnish and bill the service in a calendar month. The patient may stop CCM services at any time (effective at the end of the month) by phone call to the office staff. The patient is responsible for co-pay (up to 20% after annual deductible is met) if co-pay is required by the individual health plan.   Patient agreed to services and verbal consent obtained.   Follow up plan: Telephone appointment with care management team member scheduled for: 08/21/2021 and 08/23/2021  Julian Hy, [place] Management  Direct Dial: [phone]

## 2021-08-21 ENCOUNTER — Ambulatory Visit (INDEPENDENT_AMBULATORY_CARE_PROVIDER_SITE_OTHER): Payer: Medicare Other

## 2021-08-21 DIAGNOSIS — I4891 Unspecified atrial fibrillation: Secondary | ICD-10-CM

## 2021-08-21 DIAGNOSIS — I10 Essential (primary) hypertension: Secondary | ICD-10-CM

## 2021-08-21 NOTE — Patient Instructions (Signed)
Visit Information:  Thank you for taking the time to speak with me today.  Next follow up visit with RN Case manager:  September 19, 2021 at 10:00 am.   PATIENT GOALS/PLAN OF CARE:  Care Plan : RN Care Manager Plan of Care  Updates made by Dannielle Karvonen, RN since 08/21/2021 12:00 AM     Problem: Chronic Disease management education and care coordination needs ( A-fib, Hypertension_   Priority: High     Long-Range Goal: Development of plan of care to address crhonic disease management and care coordination needs ( a-fib, hypertension)   Start Date: 08/21/2021  Expected End Date: 12/12/2021  Priority: High  Note:   Current Barriers:  Knowledge Deficits related to plan of care for management of Atrial Fibrillation and HTN Chronic Disease Management support and education needs related to Atrial Fibrillation and HTN Patient reports she lives alone. Husband passed in August 2021. Patient reports having strong family support/ help from her children.  Patient reports having pacemaker insertion on August 10, 2021.   Continues with xeralto. Next cardiology follow up visit 08/24/2021.  Patient reports she monitors her blood pressure daily.  Recent blood pressure on 08/20/2021 was 184/64 with pulse of 60.  Patient states she has noticed her pulse has been in the 60's since having the pacemaker.   RNCM Clinical Goal(s):  Patient will verbalize basic understanding of  Atrial Fibrillation and HTN disease process and self health management plan   take all medications exactly as prescribed and will call provider for medication related questions attend all scheduled medical appointments:   continue to work with RN Care Manager to address care management and care coordination needs related to  Atrial Fibrillation and HTN through collaboration with RN Care manager, provider, and care team.  Monitor blood pressure and pulse daily  Interventions: 1:1 collaboration with primary care provider regarding  development and update of comprehensive plan of care as evidenced by provider attestation and co-signature Inter-disciplinary care team collaboration (see longitudinal plan of care) Evaluation of current treatment plan related to  self management and patient's adherence to plan as established by provider  Hypertension Interventions: Last practice recorded BP readings:  BP Readings from Last 3 Encounters:  08/12/21 (!) 123/31  07/21/21 104/62  05/24/21 112/68  Most recent eGFR/CrCl:  Lab Results  Component Value Date   EGFR 50 (L) 05/24/2021    No components found for: CRCL  Evaluation of current treatment plan related to hypertension self management and patient's adherence to plan as established by provider; Provided education to patient re: stroke prevention, s/s of heart attack and stroke; Reviewed medications with patient and discussed importance of compliance; Discussed plans with patient for ongoing care management follow up and provided patient with direct contact information for care management team; Reviewed scheduled/upcoming provider appointments including:  RNCM sent education article in MyChart on Hypertension management to patient AFIB Interventions:    Counseled on increased risk of stroke due to Afib and benefits of anticoagulation for stroke prevention; Reviewed importance of adherence to anticoagulant exactly as prescribed; Counseled on bleeding risk associated with Afib and importance of self-monitoring for signs/symptoms of bleeding; Counseled on avoidance of NSAIDs due to increased bleeding risk with anticoagulants; Counseled on seeking medical attention after a head injury or if there is blood in the urine/stool; Afib action plan reviewed; RNCM sent education article in MyChart on Atrial fibrillation to patient.   Patient Goals/Self-Care Activities: Patient will self administer medications as prescribed Patient will attend  all scheduled provider  appointments Patient will call pharmacy for medication refills Patient will call provider office for new concerns or questions Will continue to work with clinical team to address health care and disease management needs.  Patient will monitor blood pressure daily and record results.  Notify provider for elevated blood pressures above 140/90 or less than 90/60.  Take your recorded blood pressure readings to your provider visits.  Notify your provider for signs/ symptoms of bleeding in urine or stool, from gums, excessive bruising  Avoid taking NSAID ( Advil, aleve, ibuprofen ) due to increase bleeding risk with anticoagulants ( blood thinning medications) Review education articles sent to you in MyChart on Hypertension and Atrial fibrillation      Atrial Fibrillation Atrial fibrillation is a type of heartbeat that is irregular or fast. If you have this condition, your heart beats without any order. This makes it hard for your heart to pump blood in a normal way. Atrial fibrillation may come and go, or it may become a long-lasting problem. If this condition is not treated, it can put you at higher risk for stroke, heart failure, and other heart problems. What are the causes? This condition may be caused by diseases that damage the heart. They include: High blood pressure. Heart failure. Heart valve disease. Heart surgery. Other causes include: Diabetes. Thyroid disease. Being overweight. Kidney disease. Sometimes the cause is not known. What increases the risk? You are more likely to develop this condition if: You are older. You smoke. You exercise often and very hard. You have a family history of this condition. You are a man. You use drugs. You drink a lot of alcohol. You have lung conditions, such as emphysema, pneumonia, or COPD. You have sleep apnea. What are the signs or symptoms? Common symptoms of this condition include: A feeling that your heart is beating very  fast. Chest pain or discomfort. Feeling short of breath. Suddenly feeling light-headed or weak. Getting tired easily during activity. Fainting. Sweating. In some cases, there are no symptoms. How is this treated? Treatment for this condition depends on underlying conditions and how you feel when you have atrial fibrillation. They include: Medicines to: Prevent blood clots. Treat heart rate or heart rhythm problems. Using devices, such as a pacemaker, to correct heart rhythm problems. Doing surgery to remove the part of the heart that sends bad signals. Closing an area where clots can form in the heart (left atrial appendage). In some cases, your doctor will treat other underlying conditions. Follow these instructions at home: Medicines Take over-the-counter and prescription medicines only as told by your doctor. Do not take any new medicines without first talking to your doctor. If you are taking blood thinners: Talk with your doctor before you take any medicines that have aspirin or NSAIDs, such as ibuprofen, in them. Take your medicine exactly as told by your doctor. Take it at the same time each day. Avoid activities that could hurt or bruise you. Follow instructions about how to prevent falls. Wear a bracelet that says you are taking blood thinners. Or, carry a card that lists what medicines you take. Lifestyle   Do not use any products that have nicotine or tobacco in them. These include cigarettes, e-cigarettes, and chewing tobacco. If you need help quitting, ask your doctor. Eat heart-healthy foods. Talk with your doctor about the right eating plan for you. Exercise regularly as told by your doctor. Do not drink alcohol. Lose weight if you are overweight. Do  not use drugs, including cannabis. General instructions If you have a condition that causes breathing to stop for a short period of time (apnea), treat it as told by your doctor. Keep a healthy weight. Do not use diet  pills unless your doctor says they are safe for you. Diet pills may make heart problems worse. Keep all follow-up visits as told by your doctor. This is important. Contact a doctor if: You notice a change in the speed, rhythm, or strength of your heartbeat. You are taking a blood-thinning medicine and you get more bruising. You get tired more easily when you move or exercise. You have a sudden change in weight. Get help right away if:  You have pain in your chest or your belly (abdomen). You have trouble breathing. You have side effects of blood thinners, such as blood in your vomit, poop (stool), or pee (urine), or bleeding that cannot stop. You have any signs of a stroke. "BE FAST" is an easy way to remember the main warning signs: B - Balance. Signs are dizziness, sudden trouble walking, or loss of balance. E - Eyes. Signs are trouble seeing or a change in how you see. F - Face. Signs are sudden weakness or loss of feeling in the face, or the face or eyelid drooping on one side. A - Arms. Signs are weakness or loss of feeling in an arm. This happens suddenly and usually on one side of the body. S - Speech. Signs are sudden trouble speaking, slurred speech, or trouble understanding what people say. T - Time. Time to call emergency services. Write down what time symptoms started. You have other signs of a stroke, such as: A sudden, very bad headache with no known cause. Feeling like you may vomit (nausea). Vomiting. A seizure. These symptoms may be an emergency. Do not wait to see if the symptoms will go away. Get medical help right away. Call your local emergency services (911 in the [place] Do not drive yourself to the hospital. Summary Atrial fibrillation is a type of heartbeat that is irregular or fast. You are at higher risk of this condition if you smoke, are older, have diabetes, or are overweight. Follow your doctor's instructions about medicines, diet, exercise, and follow-up  visits. Get help right away if you have signs or symptoms of a stroke. Get help right away if you cannot catch your breath, or you have chest pain or discomfort. This information is not intended to replace advice given to you by your health care provider. Make sure you discuss any questions you have with your health care provider. Document Revised: 03/25/2019 Document Reviewed: 03/25/2019 Elsevier Patient Education  2022 [place] Your Hypertension Hypertension, also called high blood pressure, is when the force of the blood pressing against the walls of the arteries is too strong. Arteries are blood vessels that carry blood from your heart throughout your body. Hypertension forces the heart to work harder to pump blood and may cause the arteries to become narrow or stiff. Understanding blood pressure readings Your personal target blood pressure may vary depending on your medical conditions, your age, and other factors. A blood pressure reading includes a higher number over a lower number. Ideally, your blood pressure should be below 120/80. You should know that: The first, or top, number is called the systolic pressure. It is a measure of the pressure in your arteries as your heart beats. The second, or bottom number, is called the diastolic pressure. It is  a measure of the pressure in your arteries as the heart relaxes. Blood pressure is classified into four stages. Based on your blood pressure reading, your health care provider may use the following stages to determine what type of treatment you need, if any. Systolic pressure and diastolic pressure are measured in a unit called mmHg. Normal Systolic pressure: below 852. Diastolic pressure: below 80. Elevated Systolic pressure: 778-242. Diastolic pressure: below 80. Hypertension stage 1 Systolic pressure: 353-614. Diastolic pressure: 43-15. Hypertension stage 2 Systolic pressure: 400 or above. Diastolic pressure: 90 or  above. How can this condition affect me? Managing your hypertension is an important responsibility. Over time, hypertension can damage the arteries and decrease blood flow to important parts of the body, including the brain, heart, and kidneys. Having untreated or uncontrolled hypertension can lead to: A heart attack. A stroke. A weakened blood vessel (aneurysm). Heart failure. Kidney damage. Eye damage. Metabolic syndrome. Memory and concentration problems. Vascular dementia. What actions can I take to manage this condition? Hypertension can be managed by making lifestyle changes and possibly by taking medicines. Your health care provider will help you make a plan to bring your blood pressure within a normal range. Nutrition  Eat a diet that is high in fiber and potassium, and low in salt (sodium), added sugar, and fat. An example eating plan is called the Dietary Approaches to Stop Hypertension (DASH) diet. To eat this way: Eat plenty of fresh fruits and vegetables. Try to fill one-half of your plate at each meal with fruits and vegetables. Eat whole grains, such as whole-wheat pasta, brown rice, or whole-grain bread. Fill about one-fourth of your plate with whole grains. Eat low-fat dairy products. Avoid fatty cuts of meat, processed or cured meats, and poultry with skin. Fill about one-fourth of your plate with lean proteins such as fish, chicken without skin, beans, eggs, and tofu. Avoid pre-made and processed foods. These tend to be higher in sodium, added sugar, and fat. Reduce your daily sodium intake. Most people with hypertension should eat less than 1,500 mg of sodium a day. Lifestyle  Work with your health care provider to maintain a healthy body weight or to lose weight. Ask what an ideal weight is for you. Get at least 30 minutes of exercise that causes your heart to beat faster (aerobic exercise) most days of the week. Activities may include walking, swimming, or  biking. Include exercise to strengthen your muscles (resistance exercise), such as weight lifting, as part of your weekly exercise routine. Try to do these types of exercises for 30 minutes at least 3 days a week. Do not use any products that contain nicotine or tobacco, such as cigarettes, e-cigarettes, and chewing tobacco. If you need help quitting, ask your health care provider. Control any long-term (chronic) conditions you have, such as high cholesterol or diabetes. Identify your sources of stress and find ways to manage stress. This may include meditation, deep breathing, or making time for fun activities. Alcohol use Do not drink alcohol if: Your health care provider tells you not to drink. You are pregnant, may be pregnant, or are planning to become pregnant. If you drink alcohol: Limit how much you use to: 0-1 drink a day for women. 0-2 drinks a day for men. Be aware of how much alcohol is in your drink. In the [place] one drink equals one 12 oz bottle of beer (355 mL), one 5 oz glass of wine (148 mL), or one 1 oz glass of hard liquor (  44 mL). Medicines Your health care provider may prescribe medicine if lifestyle changes are not enough to get your blood pressure under control and if: Your systolic blood pressure is 130 or higher. Your diastolic blood pressure is 80 or higher. Take medicines only as told by your health care provider. Follow the directions carefully. Blood pressure medicines must be taken as told by your health care provider. The medicine does not work as well when you skip doses. Skipping doses also puts you at risk for problems. Monitoring Before you monitor your blood pressure: Do not smoke, drink caffeinated beverages, or exercise within 30 minutes before taking a measurement. Use the bathroom and empty your bladder (urinate). Sit quietly for at least 5 minutes before taking measurements. Monitor your blood pressure at home as told by your health care provider. To  do this: Sit with your back straight and supported. Place your feet flat on the floor. Do not cross your legs. Support your arm on a flat surface, such as a table. Make sure your upper arm is at heart level. Each time you measure, take two or three readings one minute apart and record the results. You may also need to have your blood pressure checked regularly by your health care provider. General information Talk with your health care provider about your diet, exercise habits, and other lifestyle factors that may be contributing to hypertension. Review all the medicines you take with your health care provider because there may be side effects or interactions. Keep all visits as told by your health care provider. Your health care provider can help you create and adjust your plan for managing your high blood pressure. Where to find more information National Heart, Lung, and Blood Institute: https://wilson-eaton.com/ American Heart Association: www.heart.org Contact a health care provider if: You think you are having a reaction to medicines you have taken. You have repeated (recurrent) headaches. You feel dizzy. You have swelling in your ankles. You have trouble with your vision. Get help right away if: You develop a severe headache or confusion. You have unusual weakness or numbness, or you feel faint. You have severe pain in your chest or abdomen. You vomit repeatedly. You have trouble breathing. These symptoms may represent a serious problem that is an emergency. Do not wait to see if the symptoms will go away. Get medical help right away. Call your local emergency services (911 in the [place] Do not drive yourself to the hospital. Summary Hypertension is when the force of blood pumping through your arteries is too strong. If this condition is not controlled, it may put you at risk for serious complications. Your personal target blood pressure may vary depending on your medical conditions, your  age, and other factors. For most people, a normal blood pressure is less than 120/80. Hypertension is managed by lifestyle changes, medicines, or both. Lifestyle changes to help manage hypertension include losing weight, eating a healthy, low-sodium diet, exercising more, stopping smoking, and limiting alcohol. This information is not intended to replace advice given to you by your health care provider. Make sure you discuss any questions you have with your health care provider. Document Revised: 10/19/2019 Document Reviewed: 09/01/2019 Elsevier Patient Education  2022 [employer].    Consent to CCM Services: Ms. Cozzolino was given information about Chronic Care Management services including:  CCM service includes personalized support from designated clinical staff supervised by her physician, including individualized plan of care and coordination with other care providers 24/7 contact phone numbers for  assistance for urgent and routine care needs. Service will only be billed when office clinical staff spend 20 minutes or more in a month to coordinate care. Only one practitioner may furnish and bill the service in a calendar month. The patient may stop CCM services at any time (effective at the end of the month) by phone call to the office staff. The patient will be responsible for cost sharing (co-pay) of up to 20% of the service fee (after annual deductible is met).  Patient agreed to services and verbal consent obtained.   Patient verbalizes understanding of instructions provided today and agrees to view in [place].   The patient has been provided with contact information for the care management team and has been advised to call with any health related questions or concerns.  The care management team will reach out to the patient on September 19, 2021.  Quinn Plowman RN,BSN,CCM RN Case Manager [place]  [phone]

## 2021-08-21 NOTE — Chronic Care Management (AMB) (Signed)
Chronic Care Management   CCM RN Visit Note  08/21/2021 Name: Ashlee Warren MRN: [id number] DOB: 1928-06-20  Subjective: Ashlee Warren is a 87 y.o. year old female who is a primary care patient of Tonia Ghent, MD. The care management team was consulted for assistance with disease management and care coordination needs.    Engaged with patient by telephone for initial visit in response to provider referral for case management and/or care coordination services.   Consent to Services:  The patient was given the following information about Chronic Care Management services today, agreed to services, and gave verbal consent: 1. CCM service includes personalized support from designated clinical staff supervised by the primary care provider, including individualized plan of care and coordination with other care providers 2. 24/7 contact phone numbers for assistance for urgent and routine care needs. 3. Service will only be billed when office clinical staff spend 20 minutes or more in a month to coordinate care. 4. Only one practitioner may furnish and bill the service in a calendar month. 5.The patient may stop CCM services at any time (effective at the end of the month) by phone call to the office staff. 6. The patient will be responsible for cost sharing (co-pay) of up to 20% of the service fee (after annual deductible is met). Patient agreed to services and consent obtained.  Patient agreed to services and verbal consent obtained.   Assessment: Review of patient past medical history, allergies, medications, health status, including review of consultants reports, laboratory and other test data, was performed as part of comprehensive evaluation and provision of chronic care management services.   SDOH (Social Determinants of Health) assessments and interventions performed:  SDOH Interventions    Flowsheet Row Most Recent Value  SDOH Interventions   Food Insecurity Interventions Intervention  Not Indicated  Housing Interventions Intervention Not Indicated  Transportation Interventions Intervention Not Indicated        CCM Care Plan  Allergies  Allergen Reactions   Penicillins Rash   Sulfonamide Derivatives Rash   Tamsulosin Swelling    Swollen Lips    Outpatient Encounter Medications as of 08/21/2021  Medication Sig Note   acetaminophen (TYLENOL) 325 MG tablet Take 650 mg by mouth every 6 (six) hours as needed for moderate pain or headache.    diltiazem (CARDIZEM CD) 120 MG 24 hr capsule Take 1 capsule (120 mg total) by mouth daily.    Multiple Vitamins-Minerals (PRESERVISION AREDS 2+MULTI VIT PO) Take 1 capsule by mouth in the morning and at bedtime.  08/21/2021: Patient states she takes 1 in the am and 1 in the pm   Rivaroxaban (XARELTO) 15 MG TABS tablet TAKE 1 TABLET (15 MG TOTAL) BY MOUTH DAILY WITH SUPPER. (Patient taking differently: Take 15 mg by mouth daily with supper.)    naproxen sodium (ALEVE) 220 MG tablet Take 220 mg by mouth daily as needed (lock jaw). (Patient not taking: Reported on 08/21/2021)    No facility-administered encounter medications on file as of 08/21/2021.    Patient Active Problem List   Diagnosis Date Noted   Bradycardia 07/23/2021   Impacted cerumen of right ear 04/23/2021   Aortic atherosclerosis ([place]) 07/03/2020   Coronary atherosclerosis 07/03/2020   Hematuria 06/29/2020   Medicare annual wellness visit, subsequent 06/08/2019   Bursitis 04/05/2019   Edema 04/05/2019   Right hip pain 06/20/2018   Dysfunction of right eustachian tube 03/25/2018   Zoster 05/07/2017   Advance care planning 10/27/2015   Depression 07/28/2014  Carotid bruit 06/08/2013   Atrial fibrillation with slow ventricular response ([place]) 09/30/2012   Essential hypertension 09/30/2012    Conditions to be addressed/monitored:Atrial Fibrillation and HTN  Care Plan : RN Care Manager Plan of Care  Updates made by Dannielle Karvonen, RN since 08/21/2021 12:00 AM      Problem: Chronic Disease management education and care coordination needs ( A-fib, Hypertension_   Priority: High     Long-Range Goal: Development of plan of care to address crhonic disease management and care coordination needs ( a-fib, hypertension)   Start Date: 08/21/2021  Expected End Date: 12/12/2021  Priority: High  Note:   Current Barriers:  Knowledge Deficits related to plan of care for management of Atrial Fibrillation and HTN Chronic Disease Management support and education needs related to Atrial Fibrillation and HTN Patient reports she lives alone. Husband passed in August 2021. Patient reports having strong family support/ help from her children.  Patient reports having pacemaker insertion on August 10, 2021.   Continues with xeralto. Next cardiology follow up visit 08/24/2021.  Patient reports she monitors her blood pressure daily.  Recent blood pressure on 08/20/2021 was 184/64 with pulse of 60.  Patient states she has noticed her pulse has been in the 60's since having the pacemaker.   RNCM Clinical Goal(s):  Patient will verbalize basic understanding of  Atrial Fibrillation and HTN disease process and self health management plan   take all medications exactly as prescribed and will call provider for medication related questions attend all scheduled medical appointments:   continue to work with RN Care Manager to address care management and care coordination needs related to  Atrial Fibrillation and HTN through collaboration with RN Care manager, provider, and care team.  Monitor blood pressure and pulse daily  Interventions: 1:1 collaboration with primary care provider regarding development and update of comprehensive plan of care as evidenced by provider attestation and co-signature Inter-disciplinary care team collaboration (see longitudinal plan of care) Evaluation of current treatment plan related to  self management and patient's adherence to plan as established by  provider  Hypertension Interventions: Last practice recorded BP readings:  BP Readings from Last 3 Encounters:  08/12/21 (!) 123/31  07/21/21 104/62  05/24/21 112/68  Most recent eGFR/CrCl:  Lab Results  Component Value Date   EGFR 50 (L) 05/24/2021    No components found for: CRCL  Evaluation of current treatment plan related to hypertension self management and patient's adherence to plan as established by provider; Provided education to patient re: stroke prevention, s/s of heart attack and stroke; Reviewed medications with patient and discussed importance of compliance; Discussed plans with patient for ongoing care management follow up and provided patient with direct contact information for care management team; Reviewed scheduled/upcoming provider appointments including:  RNCM sent education article in MyChart on Hypertension management to patient AFIB Interventions:    Counseled on increased risk of stroke due to Afib and benefits of anticoagulation for stroke prevention; Reviewed importance of adherence to anticoagulant exactly as prescribed; Counseled on bleeding risk associated with Afib and importance of self-monitoring for signs/symptoms of bleeding; Counseled on avoidance of NSAIDs due to increased bleeding risk with anticoagulants; Counseled on seeking medical attention after a head injury or if there is blood in the urine/stool; Afib action plan reviewed; RNCM sent education article in MyChart on Atrial fibrillation to patient.   Patient Goals/Self-Care Activities: Patient will self administer medications as prescribed Patient will attend all scheduled provider appointments Patient will call  pharmacy for medication refills Patient will call provider office for new concerns or questions Will continue to work with clinical team to address health care and disease management needs.  Patient will monitor blood pressure daily and record results.  Notify provider for  elevated blood pressures above 140/90 or less than 90/60.  Take your recorded blood pressure readings to your provider visits.  Notify your provider for signs/ symptoms of bleeding in urine or stool, from gums, excessive bruising  Avoid taking NSAID ( Advil, aleve, ibuprofen ) due to increase bleeding risk with anticoagulants ( blood thinning medications) Review education articles sent to you in MyChart on Hypertension and Atrial fibrillation       Plan:The patient has been provided with contact information for the care management team and has been advised to call with any health related questions or concerns.  The care management team will reach out to the patient again over the next 1-2 months . Quinn Plowman RN,BSN,CCM RN Case Manager [place]  [phone]

## 2021-08-23 ENCOUNTER — Ambulatory Visit: Payer: Medicare Other | Admitting: *Deleted

## 2021-08-23 ENCOUNTER — Encounter: Payer: Self-pay | Admitting: Family Medicine

## 2021-08-23 DIAGNOSIS — I10 Essential (primary) hypertension: Secondary | ICD-10-CM

## 2021-08-23 DIAGNOSIS — Z950 Presence of cardiac pacemaker: Secondary | ICD-10-CM

## 2021-08-23 DIAGNOSIS — I4891 Unspecified atrial fibrillation: Secondary | ICD-10-CM

## 2021-08-23 NOTE — Chronic Care Management (AMB) (Signed)
Chronic Care Management    Clinical Social Work Note  08/23/2021 Name: Ashlee Warren MRN: [id number] DOB: 1928/08/27  Ashlee Warren is a 87 y.o. year old female who is a primary care patient of Tonia Ghent, MD. The CCM team was consulted to assist the patient with chronic disease management and/or care coordination needs related to: initial visit.   Engaged with patient by telephone for initial visit in response to provider referral for social work chronic care management and care coordination services.   Consent to Services:  The patient was given the following information about Chronic Care Management services today, agreed to services, and gave verbal consent: 1. CCM service includes personalized support from designated clinical staff supervised by the primary care provider, including individualized plan of care and coordination with other care providers 2. 24/7 contact phone numbers for assistance for urgent and routine care needs. 3. Service will only be billed when office clinical staff spend 20 minutes or more in a month to coordinate care. 4. Only one practitioner may furnish and bill the service in a calendar month. 5.The patient may stop CCM services at any time (effective at the end of the month) by phone call to the office staff. 6. The patient will be responsible for cost sharing (co-pay) of up to 20% of the service fee (after annual deductible is met). Patient agreed to services and consent obtained.  Patient agreed to services and consent obtained.   Assessment: Review of patient past medical history, allergies, medications, and health status, including review of relevant consultants reports was performed today as part of a comprehensive evaluation and provision of chronic care management and care coordination services.     SDOH (Social Determinants of Health) assessments and interventions performed:  SDOH Interventions    Flowsheet Row Most Recent Value  SDOH  Interventions   Social Connections Interventions Other (Comment)  [Community Care Guide referral for resources]  Transportation Interventions [facility]        Advanced Directives Status: See Care Plan for related entries.  CCM Care Plan  Allergies  Allergen Reactions   Penicillins Rash   Sulfonamide Derivatives Rash   Tamsulosin Swelling    Swollen Lips    Outpatient Encounter Medications as of 08/23/2021  Medication Sig Note   acetaminophen (TYLENOL) 325 MG tablet Take 650 mg by mouth every 6 (six) hours as needed for moderate pain or headache.    diltiazem (CARDIZEM CD) 120 MG 24 hr capsule Take 1 capsule (120 mg total) by mouth daily.    Multiple Vitamins-Minerals (PRESERVISION AREDS 2+MULTI VIT PO) Take 1 capsule by mouth in the morning and at bedtime.  08/21/2021: Patient states she takes 1 in the am and 1 in the pm   naproxen sodium (ALEVE) 220 MG tablet Take 220 mg by mouth daily as needed (lock jaw). (Patient not taking: Reported on 08/21/2021)    Rivaroxaban (XARELTO) 15 MG TABS tablet TAKE 1 TABLET (15 MG TOTAL) BY MOUTH DAILY WITH SUPPER. (Patient taking differently: Take 15 mg by mouth daily with supper.)    No facility-administered encounter medications on file as of 08/23/2021.    Patient Active Problem List   Diagnosis Date Noted   Bradycardia 07/23/2021   Impacted cerumen of right ear 04/23/2021   Aortic atherosclerosis ([place]) 07/03/2020   Coronary atherosclerosis 07/03/2020   Hematuria 06/29/2020   Medicare annual wellness visit, subsequent 06/08/2019   Bursitis 04/05/2019   Edema 04/05/2019   Right hip pain 06/20/2018  Dysfunction of right eustachian tube 03/25/2018   Zoster 05/07/2017   Advance care planning 10/27/2015   Depression 07/28/2014   Carotid bruit 06/08/2013   Atrial fibrillation with slow ventricular response ([place]) 09/30/2012   Essential hypertension 09/30/2012    Conditions to be addressed/monitored: CAD; Limited social  support, Transportation, Social Isolation, Limited access to caregiver, and Lacks knowledge of community resource:    Care Plan : LCSW plan of care  Updates made by Deirdre Peer, LCSW since 08/23/2021 12:00 AM     Problem: Social and Functional Symptoms      Long-Range Goal: Social and Functional Skills Optimized   Start Date: 08/23/2021  Expected End Date: 11/13/2021  This Visit's Progress: On track  Priority: High  Note:   Current barriers:   Patient in need of assistance with connecting to community resources for Limited social support, Transportation, Mental Health Concerns , Social Isolation, Limited access to caregiver, and Lacks knowledge of community resources, grief/loss and worry/anxiety Acknowledges deficits with meeting this unmet need Patient is unable to independently navigate community resource options without care coordination support Clinical Goals:  explore community resource options for unmet needs related to:  Transportation, Social Connections, and Stress Clinical Interventions: CSW spoke with pt and daughter/HCPOA Ashlee Warren) by phone.  Pt is living alone after losing her husband of 42 years about one year ago.  Pt is s/p pacemaker and back at home now.  Pt has a lot of worry and anxiety around getting rides and "someone to handle the schedule". She also states; "My life has been turned upside down". Pt acknowledges grief over the loss of her husband- she is lonely and per daughter may have some cognitive impairment playing in- pt was alert/oriented with CSW today- she was able to tell me how to get to her home as well as recall recent pacemaker surgery and answer questions appropriately.  Pt has a good support system of family and friends- she was active in church and is encouraged to reconnect and find things to keep her active/engaged. "I want to get back out there and rake my leaves but they said it would be a while before I can do that after my surgery".  Will have  Care Guide send pt info on adult day care/ senior center programs, meals on wheels, life alert, hospice grief support and transportation resources     Collaboration with Tonia Ghent, MD regarding development and update of comprehensive plan of care as evidenced by provider attestation and co-signature Inter-disciplinary care team collaboration (see longitudinal plan of care) Assessment of needs, barriers , agencies contacted, as well as how impacting  Review various resources, discussed options and provided patient information about Limited social support, Transportation, and Lacks knowledge of community resource: will refer to [facility] for linking to Adult Day Advice worker, transportation, local Hospice grief counseling support, life alert, meals on wheels and other resources as identified.  Collaborated with appropriate clinical care team members regarding patient needs Limited social support, Transportation, Social Isolation, and Lacks knowledge of community resource:   , CAD and Anxiety Patient interviewed and appropriate assessments performed Referred patient to community resources care guide team for assistance with linking to Adult Day Advice worker, transportation, local Hospice grief counseling support, life alert, meals on wheels and other resources as identified. Provided mental health counseling with regard to grief and loss of her husband of 12 years,    Provided patient with information about  in home private duty care/companion  services a she does not wish to consider Assisted Living placement at this time Discussed plans with patient for ongoing care management follow up and provided patient with direct contact information for care management team Other interventions provided: Depression screen reviewed  PHQ2/ PHQ9 completed Active listening / Reflection utilized  Emotional Support Provided Provided brief CBT  Caregiver stress acknowledged   Consideration of in-home help encouraged  Increase in actives / exercise encouraged  Verbalization of feelings encouraged  Patient Goals:  -review resource material to be mailed to you -consider hiring companion/caregiver support -consider attending church, community and senior center activities - begin a notebook of services in my neighborhood or community - call 211 when I need some help - follow-up on any referrals for help I am given - think ahead to make sure my need does not become an emergency - make a note about what I need to have by the phone or take with me, like an identification card or social security number have a back-up plan - have a back-up plan - make a list of family or friends that I can call   Follow Up Plan: Appointment scheduled for SW follow up with client by phone on: 09/27/21       Follow Up Plan: Appointment scheduled for SW follow up with client by phone on: 09/27/21      Eduard Clos MSW, [place] Licensed Clinical Social Worker [place] [phone]

## 2021-08-23 NOTE — Patient Instructions (Signed)
Visit Information  (Copy and paste patient goals from clinical care plan here)  The patient verbalized understanding of instructions, educational materials, and care plan provided today and declined offer to receive copy of patient instructions, educational materials, and care plan.   Telephone follow up appointment with care management team member scheduled for:09/27/21  Eduard Clos MSW, LCSW Licensed Clinical Social Worker [place] [phone]

## 2021-08-24 ENCOUNTER — Ambulatory Visit (INDEPENDENT_AMBULATORY_CARE_PROVIDER_SITE_OTHER): Payer: Medicare Other

## 2021-08-24 ENCOUNTER — Other Ambulatory Visit: Payer: Self-pay

## 2021-08-24 DIAGNOSIS — I4891 Unspecified atrial fibrillation: Secondary | ICD-10-CM

## 2021-08-24 LAB — CUP PACEART INCLINIC DEVICE CHECK
Battery Remaining Longevity: 158 mo
Battery Voltage: 3.19 V
Brady Statistic RV Percent Paced: 99.58 %
Date Time Interrogation Session: 20221110193729
Implantable Lead Implant Date: 20221028
Implantable Lead Location: 753860
Implantable Lead Model: 1948
Implantable Pulse Generator Implant Date: 20221028
Lead Channel Impedance Value: 456 Ohm
Lead Channel Impedance Value: 684 Ohm
Lead Channel Pacing Threshold Amplitude: 0.75 V
Lead Channel Pacing Threshold Pulse Width: 0.4 ms
Lead Channel Setting Pacing Amplitude: 3.5 V
Lead Channel Setting Pacing Pulse Width: 0.4 ms
Lead Channel Setting Sensing Sensitivity: 0.9 mV

## 2021-08-24 NOTE — Patient Instructions (Addendum)
   After Your Pacemaker   Monitor your pacemaker site for redness, swelling, and drainage. Call the device clinic at [phone] if you experience these symptoms or fever/chills.  Your incision was closed with Dermabond:  You may shower 1 day after your defibrillator implant and wash your incision with soap and water. Avoid lotions, ointments, or perfumes over your incision until it is well-healed.  You may use a hot tub or a pool after your wound check appointment if the incision is completely closed.  Do not lift, push or pull greater than 10 pounds with the affected arm until 6 weeks after your procedure. There are no other restrictions in arm movement after your wound check appointment.  You may drive, unless driving has been restricted by your healthcare providers.  Your Pacemaker is  MRI compatible.  Remote monitoring is used to monitor your pacemaker from home. This monitoring is scheduled every 91 days by our office. It allows [place] to keep an eye on the functioning of your device to ensure it is working properly. You will routinely see your Electrophysiologist annually (more often if necessary).

## 2021-08-24 NOTE — Progress Notes (Signed)
Wound check appointment. Steri-strips removed. Wound without redness or edema. Incision edges approximated, wound well healed. Normal device function. Thresholds, sensing, and impedances consistent with implant measurements. Device programmed at 3.5V/auto capture programmed on for extra safety margin until 3 month visit. Histogram distribution appropriate for patient and level of activity. No high ventricular rates noted. Patient educated about wound care, arm mobility, lifting restrictions. Patient enrolled in remote monitoring with next transmission scheduled 11/14/21. 91 day post implant follow up with Dr. Caryl Comes 11/14/21.

## 2021-08-29 ENCOUNTER — Telehealth: Payer: Self-pay

## 2021-08-29 NOTE — Telephone Encounter (Signed)
   Telephone encounter was:  Successful.  08/29/2021 Name: Ashlee Warren MRN: [id number] DOB: November 19, 1927  Ashlee Warren is a 87 y.o. year old female who is a primary care patient of Tonia Ghent, MD . The community resource team was consulted for assistance with  med alert, transportation, meals on wheels, grief support.  Care guide performed the following interventions: Spoke with patient's daughter Ashlee Warren about Med-Alert, Meals on Wheels, [employer], [facility] Grief Support Program.  These are the only services she is interested in at this time.  She stated that the patient has a good support system between her family and neighbors.   Follow Up Plan:  No further follow up planned at this time. The patient has been provided with needed resources.  Orby Tangen, AAS Paralegal, [place] [facility]  [address], [place] [address] ??[email or web address]  ?? [phone]   [email or web address]

## 2021-09-10 NOTE — Progress Notes (Signed)
Subjective:   Ashlee Warren is a 87 y.o. female who presents for Medicare Annual (Subsequent) preventive examination.  I connected with [place] today by telephone and verified that I am speaking with the correct person using two identifiers. Location patient: home Location provider: work Persons participating in the virtual visit: patient, Marine scientist.    I discussed the limitations, risks, security and privacy concerns of performing an evaluation and management service by telephone and the availability of in person appointments. I also discussed with the patient that there may be a patient responsible charge related to this service. The patient expressed understanding and verbally consented to this telephonic visit.    Interactive audio and video telecommunications were attempted between this provider and patient, however failed, due to patient having technical difficulties OR patient did not have access to video capability.  We continued and completed visit with audio only.  Some vital signs may be absent or patient reported.   Time Spent with patient on telephone encounter: 35 minutes  Review of Systems     Cardiac Risk Factors include: advanced age (>59men, >95 women);hypertension     Objective:    Today's Vitals   09/14/21 0944  Weight: 105 lb (47.6 kg)  Height: 5\' 2"  (1.575 m)   Body mass index is 19.2 kg/m.  Advanced Directives 09/14/2021 08/21/2021 09/06/2020 05/21/2018 03/04/2017 03/11/2013  Does Patient Have a Medical Advance Directive? Yes Yes Yes No No Patient does not have advance directive  Type of Advance Directive [place];Living will [place];Living will Living will;Healthcare Power of Attorney - - -  Does patient want to make changes to medical advance directive? - No - Patient declined - No - Patient declined - -  Copy of [place] in Chart? - - No - copy requested - - -  Would patient like information  on creating a medical advance directive? - - - No - Patient declined - -    Current Medications (verified) Outpatient Encounter Medications as of 09/14/2021  Medication Sig   acetaminophen (TYLENOL) 325 MG tablet Take 650 mg by mouth every 6 (six) hours as needed for moderate pain or headache.   diltiazem (CARDIZEM CD) 120 MG 24 hr capsule Take 1 capsule (120 mg total) by mouth daily.   Multiple Vitamins-Minerals (PRESERVISION AREDS 2+MULTI VIT PO) Take 1 capsule by mouth in the morning and at bedtime.    Rivaroxaban (XARELTO) 15 MG TABS tablet TAKE 1 TABLET (15 MG TOTAL) BY MOUTH DAILY WITH SUPPER. (Patient taking differently: Take 15 mg by mouth daily with supper.)   naproxen sodium (ALEVE) 220 MG tablet Take 220 mg by mouth daily as needed (lock jaw). (Patient not taking: Reported on 08/21/2021)   No facility-administered encounter medications on file as of 09/14/2021.    Allergies (verified) Penicillins, Sulfonamide derivatives, and Tamsulosin   History: Past Medical History:  Diagnosis Date   Atrial fibrillation ([place])    Cataract    Dysrhythmia    afib   Essential hypertension    Past Surgical History:  Procedure Laterality Date   BREAST BIOPSY     left   CARDIOVERSION N/A 03/11/2013   Procedure: CARDIOVERSION;  Surgeon: Josue Hector, MD;  Location: [facility];  Service: Cardiovascular;  Laterality: N/A;   CATARACT EXTRACTION, BILATERAL  2000   CYSTOSCOPY/URETEROSCOPY/HOLMIUM LASER/STENT PLACEMENT Right 09/16/2020   Procedure: CYSTOSCOPY, RETROGRADE PYELOGRAM, URETEROSCOPY, STENT PLACEMENT;  Surgeon: Janith Lima, MD;  Location: [facility];  Service:  Urology;  Laterality: Right;  ONLY NEEDS 45 MIN   EYE SURGERY     KIDNEY STONE SURGERY     PACEMAKER IMPLANT N/A 08/11/2021   Procedure: PACEMAKER IMPLANT;  Surgeon: Deboraha Sprang, MD;  Location: [facility];  Service: Cardiovascular;  Laterality: N/A;   TONSILLECTOMY     Family History  Problem Relation Age of  Onset   Cancer Mother        leukemia   Cancer Sister    Social History   Socioeconomic History   Marital status: Widowed    Spouse name: Not on file   Number of children: Not on file   Years of education: Not on file   Highest education level: Not on file  Occupational History   Not on file  Tobacco Use   Smoking status: Never   Smokeless tobacco: Never  Vaping Use   Vaping Use: Never used  Substance and Sexual Activity   Alcohol use: No   Drug use: No   Sexual activity: Not Currently    Birth control/protection: None  Other Topics Concern   Not on file  Social History Narrative   Widowed 2021, was married 11/05/47   Retired Therapist, sports, worked as Government social research officer, then worked with [employer], 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     Social Determinants of Health   Financial Resource Strain: Low Risk    Difficulty of Paying Living Expenses: Not hard at all  Food Insecurity: No Food Insecurity   Worried About Charity fundraiser in the Last Year: Never true   Arboriculturist in the Last Year: Never true  Transportation Needs: Unmet Transportation Needs   Lack of Transportation (Medical): Yes   Lack of Transportation (Non-Medical): No  Physical Activity: Insufficiently Active   Days of Exercise per Week: 7 days   Minutes of Exercise per Session: 10 min  Stress: No Stress Concern Present   Feeling of Stress : Only a little  Social Connections: Moderately Isolated   Frequency of Communication with Friends and Family: More than three times a week   Frequency of Social Gatherings with Friends and Family: Twice a week   Attends Religious Services: More than 4 times per year   Active Member of [employer] or Organizations: No   Attends Archivist Meetings: Never   Marital Status: Widowed    Tobacco Counseling Counseling given: Not Answered   Clinical Intake:  Pre-visit preparation completed: Yes  Pain : No/denies pain     BMI - recorded: 19.2 Nutritional Status: BMI of  19-24  Normal Nutritional Risks: None Diabetes: No  How often do you need to have someone help you when you read instructions, pamphlets, or other written materials from your doctor or pharmacy?: 1 - Never  Diabetic?No  Interpreter Needed?: No  Information entered by :: Orrin Brigham LPN   Activities of Daily Living In your present state of health, do you have any difficulty performing the following activities: 09/14/2021  Hearing? Y  Comment wears heaing aids  Vision? N  Difficulty concentrating or making decisions? N  Walking or climbing stairs? Y  Dressing or bathing? N  Doing errands, shopping? Y  Comment has assistance doing errands and going to Dr. Leonette Monarch and eating ? N  Using the Toilet? N  In the past six months, have you accidently leaked urine? N  Do you have problems with loss of bowel control? N  Managing your  Medications? N  Managing your Finances? Y  Comment grandson helps  Housekeeping or managing your Housekeeping? N  Some recent data might be hidden    Patient Care Team: Tonia Ghent, MD as PCP - General (Family Medicine) Josue Hector, MD as PCP - Cardiology (Cardiology) Deirdre Peer, LCSW as Social Worker (Licensed Clinical Social Worker) Dannielle Karvonen, RN as Case Manager  Indicate any recent [place] you may have received from other than [facility] providers in the past year (date may be approximate).     Assessment:   This is a routine wellness examination for Sugar.  Hearing/Vision screen Hearing Screening - Comments:: Wears hearing aids Vision Screening - Comments:: Last exam 2022, Dr. Schuyler Amor, had cataract surgery  Dietary issues and exercise activities discussed: Current Exercise Habits: Home exercise routine, Type of exercise: walking (walks the property and in house), Time (Minutes): 15, Frequency (Times/Week): 7, Weekly Exercise (Minutes/Week): 105, Intensity: Mild   Goals Addressed             This  Visit's Progress    Patient Stated       Would like to maintain current routine       Depression Screen PHQ 2/9 Scores 09/14/2021 08/23/2021 08/21/2021 06/05/2019 05/21/2018 03/04/2017 12/09/2014  PHQ - 2 Score 0 0 0 0 0 0 0  PHQ- 9 Score - - - - 0 - -    Fall Risk Fall Risk  09/14/2021 08/21/2021 06/05/2019 05/21/2018 05/14/2018  Falls in the past year? 0 0 0 No No  Comment - - - - Emmi Telephone Survey: data to providers prior to load  Number falls in past yr: 0 0 - - -  Injury with Fall? 0 - - - -  Risk for fall due to : No Fall Risks - - - -  Follow up Falls prevention discussed - - - -    FALL RISK PREVENTION PERTAINING TO THE HOME:  Any stairs in or around the home? Yes  If so, are there any without handrails? No  Home free of loose throw rugs in walkways, pet beds, electrical cords, etc? Yes  Adequate lighting in your home to reduce risk of falls? No   ASSISTIVE DEVICES UTILIZED TO PREVENT FALLS:  Life alert? No  Use of a cane, walker or w/c? No  Grab bars in the bathroom? Yes  Shower chair or bench in shower? No  Elevated toilet seat or a handicapped toilet? No   TIMED UP AND GO:  Was the test performed? No , visit completed over the phone.   Cognitive Function: Normal cognitive status assessed by this Nurse Health Advisor. No abnormalities found.   MMSE - Mini Mental State Exam 05/21/2018 03/04/2017  Orientation to time 5 5  Orientation to Place 5 5  Registration 3 3  Attention/ Calculation 0 0  Recall 3 1  Recall-comments - pt was unable to recall 2 of 3 words  Language- name 2 objects 0 0  Language- repeat 1 1  Language- follow 3 step command 3 3  Language- read & follow direction 0 0  Write a sentence 0 0  Copy design 0 0  Total score 20 18        Immunizations Immunization History  Administered Date(s) Administered   Fluad Quad(high Dose 65+) 09/01/2020   Influenza, High Dose Seasonal PF 07/28/2021   Influenza,inj,Quad PF,6+ Mos 10/25/2015, 07/19/2016,  07/24/2017, 08/07/2018, 06/05/2019   Influenza-Unspecified 07/28/2021   PFIZER(Purple Top)SARS-COV-2 Vaccination 12/10/2019, 01/05/2020  TDAP status: Due, Education has been provided regarding the importance of this vaccine. Advised may receive this vaccine at local pharmacy or Health Dept. Aware to provide a copy of the vaccination record if obtained from local pharmacy or Health Dept. Verbalized acceptance and understanding.  Flu Vaccine status: Up to date  Pneumococcal vaccine status: Declined,  Education has been provided regarding the importance of this vaccine but patient still declined. Advised may receive this vaccine at local pharmacy or Health Dept. Aware to provide a copy of the vaccination record if obtained from local pharmacy or Health Dept. Verbalized acceptance and understanding.   Covid-19 vaccine status: Declined, Education has been provided regarding the importance of this vaccine but patient still declined. Advised may receive this vaccine at local pharmacy or Health Dept.or vaccine clinic. Aware to provide a copy of the vaccination record if obtained from local pharmacy or Health Dept. Verbalized acceptance and understanding.  Qualifies for Shingles Vaccine? Yes   Zostavax completed No   Shingrix Completed?: No.    Education has been provided regarding the importance of this vaccine. Patient has been advised to call insurance company to determine out of pocket expense if they have not yet received this vaccine. Advised may also receive vaccine at local pharmacy or Health Dept. Verbalized acceptance and understanding.  Screening Tests Health Maintenance  Topic Date Due   Pneumonia Vaccine 13+ Years old (1 - PCV) Never done   Zoster Vaccines- Shingrix (1 of 2) Never done   COVID-19 Vaccine (3 - Booster for Pfizer series) 03/01/2020   DEXA SCAN  03/04/2026 (Originally 03/19/1995)   TETANUS/TDAP  03/05/2026 (Originally 03/18/1947)   INFLUENZA VACCINE  Completed   HPV VACCINES   Aged Out    Health Maintenance  Health Maintenance Due  Topic Date Due   Pneumonia Vaccine 62+ Years old (1 - PCV) Never done   Zoster Vaccines- Shingrix (1 of 2) Never done   COVID-19 Vaccine (3 - Booster for Pfizer series) 03/01/2020    Colorectal cancer screening: No longer required.   Mammogram status: patient declined today  Bone Density status: No longer required  Lung Cancer Screening: (Low Dose CT Chest recommended if Age 64-80 years, 30 pack-year currently smoking OR have quit w/in 15years.) does not qualify.     Additional Screening:  Hepatitis C Screening: does not qualify  Vision Screening: Recommended annual ophthalmology exams for early detection of glaucoma and other disorders of the eye. Is the patient up to date with their annual eye exam?  Yes  Who is the provider or what is the name of the office in which the patient attends annual eye exams? Dr. Schuyler Amor   Dental Screening: Recommended annual dental exams for proper oral hygiene  Community Resource Referral / Chronic Care Management: CRR required this visit?  No   CCM required this visit?  No      Plan:     I have personally reviewed and noted the following in the patient's chart:   Medical and social history Use of alcohol, tobacco or illicit drugs  Current medications and supplements including opioid prescriptions.  Functional ability and status Nutritional status Physical activity Advanced directives List of other physicians Hospitalizations, surgeries, and ER visits in previous 12 months Vitals Screenings to include cognitive, depression, and falls Referrals and appointments  In addition, I have reviewed and discussed with patient certain preventive protocols, quality metrics, and best practice recommendations. A written personalized care plan for preventive services as well as general preventive  health recommendations were provided to patient.   Due to this being a telephonic visit,  the after visit summary with patients personalized plan was offered to patient via mail or my-chart. Patient would like to access on my-chart.  Loma Messing, LPN   [id number]   Nurse Health Advisor   Nurse Notes: none

## 2021-09-13 DIAGNOSIS — I4891 Unspecified atrial fibrillation: Secondary | ICD-10-CM

## 2021-09-13 DIAGNOSIS — I10 Essential (primary) hypertension: Secondary | ICD-10-CM

## 2021-09-14 ENCOUNTER — Ambulatory Visit (INDEPENDENT_AMBULATORY_CARE_PROVIDER_SITE_OTHER): Payer: Medicare Other

## 2021-09-14 VITALS — Ht 62.0 in | Wt 105.0 lb

## 2021-09-14 DIAGNOSIS — Z0000 Encounter for general adult medical examination without abnormal findings: Secondary | ICD-10-CM

## 2021-09-14 NOTE — Patient Instructions (Addendum)
Ashlee Warren , Thank you for taking time to complete your Medicare Wellness Visit. I appreciate your ongoing commitment to your health goals. Please review the following plan we discussed and let me know if I can assist you in the future.   Screening recommendations/referrals: Colonoscopy: no longer required  Mammogram: Declined today, please call office  to schedule if you change your mind Bone Density: no longer required Recommended yearly ophthalmology/optometry visit for glaucoma screening and checkup Recommended yearly dental visit for hygiene and checkup  Vaccinations: Influenza vaccine: up to date Pneumococcal vaccine: Declined today, please call office or local pharmacy to schedule if you change your mind Tdap vaccine: Declined today, please call your local pharmacy to schedule if you change your mind Shingles vaccine: Declined today, please call office or local pharmacy to schedule if you change your mind Covid-19: newest booster available at your local pharmacy  Advanced directives: Please bring a copy of Living Will and/or Tool for your chart.   Conditions/risks identified: see problem list   Next appointment: Follow up in one year for your annual wellness visit 09/17/22 @ 9:00am, this will be a telephone visit.    Preventive Care 85 Years and Older, Female Preventive care refers to lifestyle choices and visits with your health care provider that can promote health and wellness. What does preventive care include? A yearly physical exam. This is also called an annual well check. Dental exams once or twice a year. Routine eye exams. Ask your health care provider how often you should have your eyes checked. Personal lifestyle choices, including: Daily care of your teeth and gums. Regular physical activity. Eating a healthy diet. Avoiding tobacco and drug use. Limiting alcohol use. Practicing safe sex. Taking low-dose aspirin every day. Taking  vitamin and mineral supplements as recommended by your health care provider. What happens during an annual well check? The services and screenings done by your health care provider during your annual well check will depend on your age, overall health, lifestyle risk factors, and family history of disease. Counseling  Your health care provider may ask you questions about your: Alcohol use. Tobacco use. Drug use. Emotional well-being. Home and relationship well-being. Sexual activity. Eating habits. History of falls. Memory and ability to understand (cognition). Work and work Statistician. Reproductive health. Screening  You may have the following tests or measurements: Height, weight, and BMI. Blood pressure. Lipid and cholesterol levels. These may be checked every 5 years, or more frequently if you are over 76 years old. Skin check. Lung cancer screening. You may have this screening every year starting at age 20 if you have a 30-pack-year history of smoking and currently smoke or have quit within the past 15 years. Fecal occult blood test (FOBT) of the stool. You may have this test every year starting at age 53. Flexible sigmoidoscopy or colonoscopy. You may have a sigmoidoscopy every 5 years or a colonoscopy every 10 years starting at age 52. Hepatitis C blood test. Hepatitis B blood test. Sexually transmitted disease (STD) testing. Diabetes screening. This is done by checking your blood sugar (glucose) after you have not eaten for a while (fasting). You may have this done every 1-3 years. Bone density scan. This is done to screen for osteoporosis. You may have this done starting at age 24. Mammogram. This may be done every 1-2 years. Talk to your health care provider about how often you should have regular mammograms. Talk with your health care provider about your test  results, treatment options, and if necessary, the need for more tests. Vaccines  Your health care provider may  recommend certain vaccines, such as: Influenza vaccine. This is recommended every year. Tetanus, diphtheria, and acellular pertussis (Tdap, Td) vaccine. You may need a Td booster every 10 years. Zoster vaccine. You may need this after age 65. Pneumococcal 13-valent conjugate (PCV13) vaccine. One dose is recommended after age 72. Pneumococcal polysaccharide (PPSV23) vaccine. One dose is recommended after age 17. Talk to your health care provider about which screenings and vaccines you need and how often you need them. This information is not intended to replace advice given to you by your health care provider. Make sure you discuss any questions you have with your health care provider. Document Released: 10/28/2015 Document Revised: 06/20/2016 Document Reviewed: 08/02/2015 Elsevier Interactive Patient Education  2017 [place] Prevention in the Home Falls can cause injuries. They can happen to people of all ages. There are many things you can do to make your home safe and to help prevent falls. What can I do on the outside of my home? Regularly fix the edges of walkways and driveways and fix any cracks. Remove anything that might make you trip as you walk through a door, such as a raised step or threshold. Trim any bushes or trees on the path to your home. Use bright outdoor lighting. Clear any walking paths of anything that might make someone trip, such as rocks or tools. Regularly check to see if handrails are loose or broken. Make sure that both sides of any steps have handrails. Any raised decks and porches should have guardrails on the edges. Have any leaves, snow, or ice cleared regularly. Use sand or salt on walking paths during winter. Clean up any spills in your garage right away. This includes oil or grease spills. What can I do in the bathroom? Use night lights. Install grab bars by the toilet and in the tub and shower. Do not use towel bars as grab bars. Use non-skid  mats or decals in the tub or shower. If you need to sit down in the shower, use a plastic, non-slip stool. Keep the floor dry. Clean up any water that spills on the floor as soon as it happens. Remove soap buildup in the tub or shower regularly. Attach bath mats securely with double-sided non-slip rug tape. Do not have throw rugs and other things on the floor that can make you trip. What can I do in the bedroom? Use night lights. Make sure that you have a light by your bed that is easy to reach. Do not use any sheets or blankets that are too big for your bed. They should not hang down onto the floor. Have a firm chair that has side arms. You can use this for support while you get dressed. Do not have throw rugs and other things on the floor that can make you trip. What can I do in the kitchen? Clean up any spills right away. Avoid walking on wet floors. Keep items that you use a lot in easy-to-reach places. If you need to reach something above you, use a strong step stool that has a grab bar. Keep electrical cords out of the way. Do not use floor polish or wax that makes floors slippery. If you must use wax, use non-skid floor wax. Do not have throw rugs and other things on the floor that can make you trip. What can I do with my stairs?  Do not leave any items on the stairs. Make sure that there are handrails on both sides of the stairs and use them. Fix handrails that are broken or loose. Make sure that handrails are as long as the stairways. Check any carpeting to make sure that it is firmly attached to the stairs. Fix any carpet that is loose or worn. Avoid having throw rugs at the top or bottom of the stairs. If you do have throw rugs, attach them to the floor with carpet tape. Make sure that you have a light switch at the top of the stairs and the bottom of the stairs. If you do not have them, ask someone to add them for you. What else can I do to help prevent falls? Wear shoes  that: Do not have high heels. Have rubber bottoms. Are comfortable and fit you well. Are closed at the toe. Do not wear sandals. If you use a stepladder: Make sure that it is fully opened. Do not climb a closed stepladder. Make sure that both sides of the stepladder are locked into place. Ask someone to hold it for you, if possible. Clearly mark and make sure that you can see: Any grab bars or handrails. First and last steps. Where the edge of each step is. Use tools that help you move around (mobility aids) if they are needed. These include: Canes. Walkers. Scooters. Crutches. Turn on the lights when you go into a dark area. Replace any light bulbs as soon as they burn out. Set up your furniture so you have a clear path. Avoid moving your furniture around. If any of your floors are uneven, fix them. If there are any pets around you, be aware of where they are. Review your medicines with your doctor. Some medicines can make you feel dizzy. This can increase your chance of falling. Ask your doctor what other things that you can do to help prevent falls. This information is not intended to replace advice given to you by your health care provider. Make sure you discuss any questions you have with your health care provider. Document Released: 07/28/2009 Document Revised: 03/08/2016 Document Reviewed: 11/05/2014 Elsevier Interactive Patient Education  2017 [employer].

## 2021-09-19 ENCOUNTER — Ambulatory Visit (INDEPENDENT_AMBULATORY_CARE_PROVIDER_SITE_OTHER): Payer: Medicare Other

## 2021-09-19 DIAGNOSIS — I4891 Unspecified atrial fibrillation: Secondary | ICD-10-CM

## 2021-09-19 DIAGNOSIS — I10 Essential (primary) hypertension: Secondary | ICD-10-CM

## 2021-09-19 NOTE — Patient Instructions (Signed)
Visit Information  Thank you for taking time to visit with me today. Please don't hesitate to contact me if I can be of assistance to you before our next scheduled telephone appointment.  Patient Goals/Self-Care Activities: Patient will self administer medications as prescribed Patient will attend all scheduled provider appointments Patient will call pharmacy for medication refills Patient will call provider office for new concerns or questions Will continue to work with clinical team to address health care and disease management needs.  Patient will monitor blood pressure at least 1-2 times a week and record results.  Notify provider for elevated blood pressures above 140/90 or less than 90/60.  Take your recorded blood pressure readings to your provider visits.  Notify your provider for signs/ symptoms of bleeding in urine or stool, from gums, excessive bruising  Avoid taking NSAID ( Advil, aleve, ibuprofen ) due to increase bleeding risk with anticoagulants ( blood thinning medications) Continue to monitor your pulse/ oxygen level and record.  Discuss family concerns and/ or any anxiety symptoms with your social worker.    Our next appointment is by telephone on December 04, 2021 at 10:00 am  Please call the care guide team at [phone] if you need to cancel or reschedule your appointment.   If you are experiencing a Mental Health or [place] or need someone to talk to, please call the Suicide and Crisis Lifeline: 988 call 1-800-273-TALK (toll free, 24 hour hotline)   Patient verbalizes understanding of instructions provided today and agrees to view in [place].   Quinn Plowman RN,BSN,CCM RN Case Manager [place]  [phone]

## 2021-09-19 NOTE — Chronic Care Management (AMB) (Signed)
Chronic Care Management   CCM RN Visit Note  09/19/2021 Name: Ashlee Warren MRN: [id number] DOB: [birth year]  Subjective: Ashlee Warren is a 87 y.o. year old female who is a primary care patient of Tonia Ghent, MD. The care management team was consulted for assistance with disease management and care coordination needs.    Engaged with patient by telephone for follow up visit in response to provider referral for case management and/or care coordination services.   Consent to Services:  The patient was given information about Chronic Care Management services, agreed to services, and gave verbal consent prior to initiation of services.  Please see initial visit note for detailed documentation.   Patient agreed to services and verbal consent obtained.   Assessment: Review of patient past medical history, allergies, medications, health status, including review of consultants reports, laboratory and other test data, was performed as part of comprehensive evaluation and provision of chronic care management services.   SDOH (Social Determinants of Health) assessments and interventions performed:    CCM Care Plan  Allergies  Allergen Reactions   Penicillins Rash   Sulfonamide Derivatives Rash   Tamsulosin Swelling    Swollen Lips    Outpatient Encounter Medications as of 09/19/2021  Medication Sig Note   acetaminophen (TYLENOL) 325 MG tablet Take 650 mg by mouth every 6 (six) hours as needed for moderate pain or headache.    diltiazem (CARDIZEM CD) 120 MG 24 hr capsule Take 1 capsule (120 mg total) by mouth daily.    Multiple Vitamins-Minerals (PRESERVISION AREDS 2+MULTI VIT PO) Take 1 capsule by mouth in the morning and at bedtime.  08/21/2021: Patient states she takes 1 in the am and 1 in the pm   naproxen sodium (ALEVE) 220 MG tablet Take 220 mg by mouth daily as needed (lock jaw). (Patient not taking: Reported on 08/21/2021)    Rivaroxaban (XARELTO) 15 MG TABS tablet TAKE 1 TABLET  (15 MG TOTAL) BY MOUTH DAILY WITH SUPPER. (Patient taking differently: Take 15 mg by mouth daily with supper.)    No facility-administered encounter medications on file as of 09/19/2021.    Patient Active Problem List   Diagnosis Date Noted   Bradycardia 07/23/2021   Impacted cerumen of right ear 04/23/2021   Aortic atherosclerosis ([place]) 07/03/2020   Coronary atherosclerosis 07/03/2020   Hematuria 06/29/2020   Medicare annual wellness visit, subsequent 06/08/2019   Bursitis 04/05/2019   Edema 04/05/2019   Right hip pain 06/20/2018   Dysfunction of right eustachian tube 03/25/2018   Zoster 05/07/2017   Advance care planning 10/27/2015   Depression 07/28/2014   Carotid bruit 06/08/2013   Atrial fibrillation with slow ventricular response ([place]) 09/30/2012   Essential hypertension 09/30/2012    Conditions to be addressed/monitored:Atrial Fibrillation and HTN  Care Plan : RN Care Manager Plan of Care  Updates made by Dannielle Karvonen, RN since 09/19/2021 12:00 AM     Problem: Chronic Disease management education and care coordination needs ( A-fib, Hypertension_   Priority: High     Long-Range Goal: Development of plan of care to address crhonic disease management and care coordination needs ( a-fib, hypertension)   Start Date: 08/21/2021  Expected End Date: 12/12/2021  Priority: High  Note:   Current Barriers:  Knowledge Deficits related to plan of care for management of Atrial Fibrillation and HTN Chronic Disease Management support and education needs related to Atrial Fibrillation and HTN Patient states her pulse readings have ranged from 60-62.  She states  she has not been checking her blood pressure regularly.  Patient checked blood pressure with wrist monitor while on the phone with RNCM.  She reports blood pressure reading to be151/87.  Patient voiced concern that her children are not listening to her. She states they are concerned that she is having memory issues and that she  has said " mean" things to them since having a pacemaker placed in October 2022.   Patient states she does not recall this being the case.  She states this has been very upsetting for her.   Received return call from patients daughter / designated party release, Sigmund Hazel.  HIPAA verified by daughter for patient.  Daughter states she feels patient is having some memory issues. She states she sent message to patients primary care provider voicing her concerns.   Daughter advised to talk with patient regarding follow up with provider.   RNCM Clinical Goal(s):  Patient will verbalize basic understanding of  Atrial Fibrillation and HTN disease process and self health management plan   take all medications exactly as prescribed and will call provider for medication related questions attend all scheduled medical appointments:   continue to work with RN Care Manager to address care management and care coordination needs related to  Atrial Fibrillation and HTN through collaboration with RN Care manager, provider, and care team.  Monitor blood pressure and pulse daily  Interventions: 1:1 collaboration with primary care provider regarding development and update of comprehensive plan of care as evidenced by provider attestation and co-signature Inter-disciplinary care team collaboration (see longitudinal plan of care) Evaluation of current treatment plan related to  self management and patient's adherence to plan as established by provider  Hypertension Interventions: Goal on track:  Long term  Last practice recorded BP readings:  BP Readings from Last 3 Encounters:  08/12/21 (!) 123/31  07/21/21 104/62  05/24/21 112/68  Most recent eGFR/CrCl:  Lab Results  Component Value Date   EGFR 50 (L) 05/24/2021    No components found for: CRCL  Evaluation of current treatment plan related to hypertension self management and patient's adherence to plan as established by provider; Provided education to  patient re: stroke prevention, s/s of heart attack and stroke; Reviewed medications with patient and discussed importance of compliance; Discussed plans with patient for ongoing care management follow up and provided patient with direct contact information for care management team; Reviewed scheduled/upcoming provider appointments including:  Advised to monitor blood pressure 1-2 times per week and record.   AFIB Interventions: Goal on track: Long term   Counseled on increased risk of stroke due to Afib and benefits of anticoagulation for stroke prevention; Reviewed importance of adherence to anticoagulant exactly as prescribed; Counseled on bleeding risk associated with Afib and importance of self-monitoring for signs/symptoms of bleeding; Counseled on avoidance of NSAIDs due to increased bleeding risk with anticoagulants; Counseled on seeking medical attention after a head injury or if there is blood in the urine/stool; Afib action plan reviewed; Advised to continue to monitor pulse/ O2 saturation.  Notify provider for provider of concerns/ questions  Family concerns: New goal:  Longterm Allowed patient to verbalize concerns and feelings Provided emotional support Advised patient to talk with social worker regarding concerns Updated social worker regarding patients family concerns Advised patient to discuss concerns with provider    Patient Goals/Self-Care Activities: Patient will self administer medications as prescribed Patient will attend all scheduled provider appointments Patient will call pharmacy for medication refills Patient will call provider  office for new concerns or questions Will continue to work with clinical team to address health care and disease management needs.  Patient will monitor blood pressure at least 1-2 times a week and record results.  Notify provider for elevated blood pressures above 140/90 or less than 90/60.  Take your recorded blood pressure readings to  your provider visits.  Notify your provider for signs/ symptoms of bleeding in urine or stool, from gums, excessive bruising  Avoid taking NSAID ( Advil, aleve, ibuprofen ) due to increase bleeding risk with anticoagulants ( blood thinning medications) Continue to monitor your pulse/ oxygen level and record.  Discuss family concerns and/ or any anxiety symptoms with your social worker.        Plan:The patient has been provided with contact information for the care management team and has been advised to call with any health related questions or concerns.  The care management team will reach out to the patient again over the next 2-3 months . Quinn Plowman RN,BSN,CCM RN Case Manager [place]  [phone]

## 2021-09-27 ENCOUNTER — Ambulatory Visit: Payer: Medicare Other | Admitting: *Deleted

## 2021-09-27 DIAGNOSIS — I4891 Unspecified atrial fibrillation: Secondary | ICD-10-CM

## 2021-09-27 DIAGNOSIS — I10 Essential (primary) hypertension: Secondary | ICD-10-CM

## 2021-09-27 DIAGNOSIS — Z950 Presence of cardiac pacemaker: Secondary | ICD-10-CM

## 2021-09-29 NOTE — Patient Instructions (Signed)
Visit Information  Thank you for taking time to visit with me today. Please don't hesitate to contact me if I can be of assistance to you .        Please call the care guide team at [phone] if you need to cancel or reschedule your appointment.   If you are experiencing a Mental Health or [place] or need someone to talk to, please call the Suicide and Crisis Lifeline: 988 call 911   The patient verbalized understanding of instructions, educational materials, and care plan provided today and declined offer to receive copy of patient instructions, educational materials, and care plan.   Eduard Clos MSW, LCSW Licensed Clinical Social Worker [place] [phone]

## 2021-09-29 NOTE — Chronic Care Management (AMB) (Signed)
Chronic Care Management    Clinical Social Work Note  09/29/2021 Name: Ashlee Warren MRN: [id number] DOB: [birth year]  Ashlee Warren is a 87 y.o. year old female who is a primary care patient of Damita Dunnings, Elveria Rising, MD. The CCM team was consulted to assist the patient with chronic disease management and/or care coordination needs related to: [employer]  and [place] and Resources.   Engaged with patient by telephone for follow up visit in response to provider referral for social work chronic care management and care coordination services.   Consent to Services:  The patient was given information about Chronic Care Management services, agreed to services, and gave verbal consent prior to initiation of services.  Please see initial visit note for detailed documentation.   Patient agreed to services and consent obtained.   Assessment: Review of patient past medical history, allergies, medications, and health status, including review of relevant consultants reports was performed today as part of a comprehensive evaluation and provision of chronic care management and care coordination services.     SDOH (Social Determinants of Health) assessments and interventions performed:  SDOH Interventions    Flowsheet Row Most Recent Value  SDOH Interventions   Transportation Interventions [facility]        Advanced Directives Status: Not addressed in this encounter.  CCM Care Plan  Allergies  Allergen Reactions   Penicillins Rash   Sulfonamide Derivatives Rash   Tamsulosin Swelling    Swollen Lips    Outpatient Encounter Medications as of 09/27/2021  Medication Sig Note   acetaminophen (TYLENOL) 325 MG tablet Take 650 mg by mouth every 6 (six) hours as needed for moderate pain or headache.    diltiazem (CARDIZEM CD) 120 MG 24 hr capsule Take 1 capsule (120 mg total) by mouth daily.    Multiple Vitamins-Minerals (PRESERVISION AREDS 2+MULTI VIT PO)  Take 1 capsule by mouth in the morning and at bedtime.  08/21/2021: Patient states she takes 1 in the am and 1 in the pm   naproxen sodium (ALEVE) 220 MG tablet Take 220 mg by mouth daily as needed (lock jaw). (Patient not taking: Reported on 08/21/2021)    Rivaroxaban (XARELTO) 15 MG TABS tablet TAKE 1 TABLET (15 MG TOTAL) BY MOUTH DAILY WITH SUPPER. (Patient taking differently: Take 15 mg by mouth daily with supper.)    No facility-administered encounter medications on file as of 09/27/2021.    Patient Active Problem List   Diagnosis Date Noted   Bradycardia 07/23/2021   Impacted cerumen of right ear 04/23/2021   Aortic atherosclerosis ([place]) 07/03/2020   Coronary atherosclerosis 07/03/2020   Hematuria 06/29/2020   Medicare annual wellness visit, subsequent 06/08/2019   Bursitis 04/05/2019   Edema 04/05/2019   Right hip pain 06/20/2018   Dysfunction of right eustachian tube 03/25/2018   Zoster 05/07/2017   Advance care planning 10/27/2015   Depression 07/28/2014   Carotid bruit 06/08/2013   Atrial fibrillation with slow ventricular response ([place]) 09/30/2012   Essential hypertension 09/30/2012    Conditions to be addressed/monitored: Depression; Level of care concerns and Mental Health Concerns   Care Plan : LCSW plan of care  Updates made by Deirdre Peer, LCSW since 09/29/2021 12:00 AM     Problem: Social and Functional Symptoms      Long-Range Goal: Social and Functional Skills Optimized Completed 09/27/2021  Start Date: 08/23/2021  Expected End Date: 11/13/2021  This Visit's Progress: On track  Recent Progress: On track  Priority:  High  Note:   Current barriers:   Patient in need of assistance with connecting to community resources for Limited social support, Transportation, Mental Health Concerns , Social Isolation, Limited access to caregiver, and Lacks knowledge of community resources, grief/loss and worry/anxiety Acknowledges deficits with meeting this unmet  need Patient is unable to independently navigate community resource options without care coordination support Clinical Goals:  explore community resource options for unmet needs related to:  Transportation, Social Connections, and Stress Clinical Interventions: CSW spoke with pt and daughter/HCPOA Ashlee Warren) by phone.  Pt ireports she is doing better but does have questions about her pacemaker- encouraged pt and daughter to discuss with Provider- call or next visit.  Pt declines counseling at this time; "I am much better"- she denies having depression or anxiety; "just concerned". Offered support and encouraged more activity having thoroughly enjoyed a Christmas party at [employer].  Daughter and pt are still considering hiring caregiver/companion - daughter received resources for adult day care/ senior center programs, meals on wheels, life alert, hospice grief support and transportation resources  provided by Care Guide and will share with pt also.   Pt and daughter agree with plans for CSW to signoff and know they can reach out or ask PCP to re consult if needs arise.     Collaboration with Tonia Ghent, MD regarding development and update of comprehensive plan of care as evidenced by provider attestation and co-signature Inter-disciplinary care team collaboration (see longitudinal plan of care) Assessment of needs, barriers , agencies contacted, as well as how impacting  Review various resources, discussed options and provided patient information about Limited social support, Transportation, and Lacks knowledge of community resource: will refer to [facility] for linking to Adult Day Advice worker, transportation, local Hospice grief counseling support, life alert, meals on wheels and other resources as identified.  Collaborated with appropriate clinical care team members regarding patient needs Limited social support, Transportation, Social Isolation, and Lacks knowledge of  community resource:   , CAD and Anxiety Patient interviewed and appropriate assessments performed Referred patient to community resources care guide team for assistance with linking to Adult Day Advice worker, transportation, local Hospice grief counseling support, life alert, meals on wheels and other resources as identified. Provided mental health counseling with regard to grief and loss of her husband of 60 years,    Provided patient with information about  in home private duty care/companion services a she does not wish to consider Assisted Living placement at this time Discussed plans with patient for ongoing care management follow up and provided patient with direct contact information for care management team Other interventions provided: Depression screen reviewed  PHQ2/ PHQ9 completed Active listening / Reflection utilized  Emotional Support Provided Provided brief CBT  Caregiver stress acknowledged  Consideration of in-home help encouraged  Increase in actives / exercise encouraged  Verbalization of feelings encouraged  Patient Goals:  -review resource material to be mailed to you -consider hiring companion/caregiver support -consider attending church, community and senior center activities - begin a notebook of services in my neighborhood or community - call 211 when I need some help - follow-up on any referrals for help I am given - think ahead to make sure my need does not become an emergency - make a note about what I need to have by the phone or take with me, like an identification card or social security number have a back-up plan - have a back-up plan - make a list  of family or friends that I can call   Follow Up Plan: N/A      Follow Up Plan: Client will contact CSW if further needs arise.       Eduard Clos MSW, LCSW Licensed Clinical Social Worker [place] [phone]

## 2021-10-12 ENCOUNTER — Other Ambulatory Visit: Payer: Self-pay | Admitting: Family Medicine

## 2021-10-14 ENCOUNTER — Other Ambulatory Visit: Payer: Self-pay | Admitting: Family Medicine

## 2021-10-14 DIAGNOSIS — I10 Essential (primary) hypertension: Secondary | ICD-10-CM

## 2021-10-14 DIAGNOSIS — I4891 Unspecified atrial fibrillation: Secondary | ICD-10-CM | POA: Diagnosis not present

## 2021-10-19 ENCOUNTER — Encounter: Payer: Self-pay | Admitting: Family Medicine

## 2021-10-19 ENCOUNTER — Ambulatory Visit (INDEPENDENT_AMBULATORY_CARE_PROVIDER_SITE_OTHER): Payer: Medicare Other | Admitting: Family Medicine

## 2021-10-19 ENCOUNTER — Other Ambulatory Visit: Payer: Self-pay

## 2021-10-19 DIAGNOSIS — R413 Other amnesia: Secondary | ICD-10-CM

## 2021-10-19 NOTE — Progress Notes (Signed)
This visit occurred during the SARS-CoV-2 public health emergency.  Safety protocols were in place, including screening questions prior to the visit, additional usage of staff PPE, and extensive cleaning of exam room while observing appropriate contact time as indicated for disinfecting solutions.  Memory follow up.  We talked about her situation, her husband has passed, she had trouble with bradycardia.  She had lower pulse and didn't remember the ER eval/pacemaker insertion.  That specific memory labs was likely exacerbated by a her illness at the time.  She describes distress of the loss of her independence and grief but not anger.  She is intent on managing her own medications.  She does not want to use a pillbox and is infuriated at the thought of needing to do so.  She wants to use her system and continue with self-management.  She has been occasionally dizzy versus lightheaded and it is unclear if that is med related.  No falls.  Discussed with patient and daughter at office visit today.  She not having chest pain.  Meds, vitals, and allergies reviewed.   ROS: Per HPI unless specifically indicated in ROS section   GEN: nad, alert and oriented except as below.   HEENT: ncat NECK: supple w/o LA CV: rrr.   PULM: ctab, no inc wob ABD: soft, +bs EXT: no edema SKIN: no acute rash  MMSE 27/30.  She missed one point for the season (thought it was fall), one point for the day of the week (she was 1 day off), and one point for object recall

## 2021-10-19 NOTE — Patient Instructions (Addendum)
Don't change your meds for now.   We'll call and check on your next week.  Take care.  Glad to see you.

## 2021-10-22 ENCOUNTER — Telehealth: Payer: Self-pay | Admitting: Family Medicine

## 2021-10-22 DIAGNOSIS — R413 Other amnesia: Secondary | ICD-10-CM | POA: Insufficient documentation

## 2021-10-22 NOTE — Telephone Encounter (Signed)
Please get update on patient later this week-see how she is doing in terms of lightheadedness, medication administration, how she is feeling in general home.  Thanks.

## 2021-10-22 NOTE — Assessment & Plan Note (Signed)
Detailed conversation.  She scored 27/30 on MMSE which is not perfect but not consistent with severe memory impairment.  She does have some lapses of memory likely related to previous illness (around the time of pacemaker insertion).  She describes grief at the loss of her husband and worry about the loss of independence.  She is adamant that she wants to manage her own medications and refuses to budge on that or let her family help her with her medicines.  I cannot force her to make changes.  She wants to do things her way and I think it makes sense to give her the opportunity to do so, as I do not see any other workable alternative.  Discussed with patient and daughter.  We will call pt next week and check on her.  She'll continue as is with her meds for now.   50 minutes were devoted to patient care in this encounter (this includes time spent reviewing the patient's file/history, interviewing and examining the patient, counseling/reviewing plan with patient).

## 2021-10-25 NOTE — Telephone Encounter (Signed)
Noted. Thanks. Then I would continue as is.  Please update me as needed.

## 2021-10-25 NOTE — Telephone Encounter (Signed)
Spoke with patient and relayed message. Patient thanked [place] for checking in on her and will continue as is.

## 2021-10-25 NOTE — Telephone Encounter (Signed)
Spoke with patient and she is feeling and doing better then she was before. She states she still gets a little lightheaded from time to time when she turns in the bed at night but that's all. She has been tracking her BP and has been good; states this am was 128/68 and pulse was 60. Patient stated she is slowly getting better and doing okay with her medications and everything at the moment.

## 2021-11-12 ENCOUNTER — Encounter: Payer: Self-pay | Admitting: Internal Medicine

## 2021-11-14 ENCOUNTER — Ambulatory Visit (INDEPENDENT_AMBULATORY_CARE_PROVIDER_SITE_OTHER): Payer: Medicare Other | Admitting: Internal Medicine

## 2021-11-14 ENCOUNTER — Other Ambulatory Visit: Payer: Self-pay

## 2021-11-14 ENCOUNTER — Ambulatory Visit (INDEPENDENT_AMBULATORY_CARE_PROVIDER_SITE_OTHER): Payer: Medicare Other

## 2021-11-14 ENCOUNTER — Encounter: Payer: Self-pay | Admitting: Internal Medicine

## 2021-11-14 VITALS — BP 136/70 | HR 60 | Ht 62.0 in | Wt 112.6 lb

## 2021-11-14 DIAGNOSIS — Z950 Presence of cardiac pacemaker: Secondary | ICD-10-CM

## 2021-11-14 DIAGNOSIS — R001 Bradycardia, unspecified: Secondary | ICD-10-CM

## 2021-11-14 DIAGNOSIS — N200 Calculus of kidney: Secondary | ICD-10-CM | POA: Diagnosis not present

## 2021-11-14 DIAGNOSIS — I442 Atrioventricular block, complete: Secondary | ICD-10-CM

## 2021-11-14 DIAGNOSIS — I4891 Unspecified atrial fibrillation: Secondary | ICD-10-CM | POA: Diagnosis not present

## 2021-11-14 LAB — CUP PACEART REMOTE DEVICE CHECK
Battery Remaining Longevity: 168 mo
Battery Voltage: 3.18 V
Brady Statistic RV Percent Paced: 99.33 %
Date Time Interrogation Session: 20230131052032
Implantable Lead Implant Date: 20221028
Implantable Lead Location: 753860
Implantable Lead Model: 1948
Implantable Pulse Generator Implant Date: 20221028
Lead Channel Impedance Value: 494 Ohm
Lead Channel Impedance Value: 760 Ohm
Lead Channel Pacing Threshold Amplitude: 0.625 V
Lead Channel Pacing Threshold Pulse Width: 0.4 ms
Lead Channel Sensing Intrinsic Amplitude: 21.75 mV
Lead Channel Sensing Intrinsic Amplitude: 21.75 mV
Lead Channel Setting Pacing Amplitude: 2 V
Lead Channel Setting Pacing Pulse Width: 0.4 ms
Lead Channel Setting Sensing Sensitivity: 0.9 mV

## 2021-11-14 NOTE — Telephone Encounter (Signed)
Pt was seen in [place] today by Dr Caryl Comes.

## 2021-11-14 NOTE — Progress Notes (Signed)
Patient Care Team: Ashlee Ghent, MD as PCP - General (Family Medicine) Ashlee Hector, MD as PCP - Cardiology (Cardiology) Ashlee Karvonen, RN as Case Manager   HPI  Ashlee Warren is a 88 y.o. female seen in follow-up for pacemaker Medtronic left bundle branch area implanted 10/22 for complete heart block in the setting of permanent atrial fibrillation.  Today, she is accompanied with her daughter.  There are numerous complaints.  The first is that there is tenderness at the incision site which is irritated by wearing a bra strap.  She still experiences   'jitterness."  She has some orthostatic intolerance notable primarily in the morning.  Denies dyspnea.  She thinks she is modestly better following pacing.  There are some concerns about cognitive function which were attributed to her heart rates in the 30s.  This is improved somewhat.    Her daughter reports when Ashlee Warren is sleeping and she gets up, she experiences dizziness   Her daughter says she has not been the same after she experienced pacing in her heart. Her memory and her activity level has decreased.  But as this was explored, this dates back actually to the death of her husband about a year and a half ago.  She tells the story in detail as to the last morning, making eating oatmeal, lying down on the bed and calling the children, looking at her just before he died.  Depressing and depressed.  She is also quite upset and tearful as she tries to articulate frustrations that she is having and communicating with her children and her children communicating with her.  The patient denies shortness of breath, nocturnal dyspnea, orthopnea or peripheral edema.  There have been no palpitations, or syncope.     10/22 echocardiogram EF normal  Date Cr K Hgb  10/22 1.04 4.0 12.7         Records and Results Reviewed   Past Medical History:  Diagnosis Date   Atrial fibrillation ([place])    Cataract    Dysrhythmia     afib   Essential hypertension     Past Surgical History:  Procedure Laterality Date   BREAST BIOPSY     left   CARDIOVERSION N/A 03/11/2013   Procedure: CARDIOVERSION;  Surgeon: Ashlee Hector, MD;  Location: [facility];  Service: Cardiovascular;  Laterality: N/A;   CATARACT EXTRACTION, BILATERAL  2000   CYSTOSCOPY/URETEROSCOPY/HOLMIUM LASER/STENT PLACEMENT Right 09/16/2020   Procedure: CYSTOSCOPY, RETROGRADE PYELOGRAM, URETEROSCOPY, STENT PLACEMENT;  Surgeon: Janith Lima, MD;  Location: [facility];  Service: Urology;  Laterality: Right;  ONLY NEEDS 45 MIN   EYE SURGERY     KIDNEY STONE SURGERY     PACEMAKER IMPLANT N/A 08/11/2021   Procedure: PACEMAKER IMPLANT;  Surgeon: Deboraha Sprang, MD;  Location: [facility];  Service: Cardiovascular;  Laterality: N/A;   TONSILLECTOMY      Current Meds  Medication Sig   acetaminophen (TYLENOL) 325 MG tablet Take 650 mg by mouth every 6 (six) hours as needed for moderate pain or headache.   diltiazem (CARDIZEM CD) 120 MG 24 hr capsule Take 1 capsule (120 mg total) by mouth daily.   Multiple Vitamins-Minerals (PRESERVISION AREDS 2+MULTI VIT PO) Take 1 capsule by mouth in the morning and at bedtime.    Rivaroxaban (XARELTO) 15 MG TABS tablet TAKE 1 TABLET (15 MG TOTAL) BY MOUTH DAILY WITH SUPPER.    Allergies  Allergen Reactions  Penicillins Rash   Sulfonamide Derivatives Rash   Tamsulosin Swelling    Swollen Lips    ROS:  Please see the history of present illness. (+) Pain at the surgical site  (+) Vertigo All other systems are reviewed and negative.   Review of Systems negative except from HPI and PMH  Physical Exam BP 136/70    Pulse 60    Ht 5\' 2"  (1.575 m)    Wt 112 lb 9.6 oz (51.1 kg)    SpO2 97%    BMI 20.59 kg/m  Well developed and well nourished in no acute distress HENT normal E scleral and icterus clear Neck Supple JVP flat; carotids brisk and full Clear to ausculation Device pocket well healed; without  hematoma or erythema.  There is no tethering Regular rate and rhythm, no murmurs gallops or rub Soft with active bowel sounds No clubbing cyanosis  Edema Alert and oriented, grossly normal motor and sensory function Skin Warm and Dry  ECG atrial fibrillation with pacing at 60 Interval-/15/46  CrCl cannot be calculated (Patient's most recent lab result is older than the maximum 21 days allowed.).   Assessment and  Plan Atrial fibrillation-permanent  Complete heart block  Pacing-Medtronic left bundle branch area  Grieving   The patient's heart rates are much improved following pacing with improved cognitive function.  Rate response was programmed off; we have activated today.  Some of this jittery sensation which is fleeting, might be related to the 0.4% sensed beats i.e. PVCs.  We will increase her lower rate from 60--70 in the hopes of trying to overdrive suppress this.  Long discussion regarding the loss of her husband and the challenges of communicating with her family.  Suggested to her daughter that they consider reading Being Mortal  No bleeding.  Continue rivaroxaban 15  Blood pressure well controlled.  Continue diltiazem 120   Current medicines are reviewed at length with the patient today .  The patient does not have concerns regarding medicines.    I,Tinashe Williams,acting as a Education administrator for Virl Axe, MD.,have documented all relevant documentation on the behalf of Virl Axe, MD,as directed by  Virl Axe, MD while in the presence of Virl Axe, MD.   I, Virl Axe, MD, have reviewed all documentation for this visit. The documentation on 11/14/21 for the exam, diagnosis, procedures, and orders are all accurate and complete.

## 2021-11-14 NOTE — Patient Instructions (Signed)
Medication Instructions:  Your physician recommends that you continue on your current medications as directed. Please refer to the Current Medication list given to you today.  *If you need a refill on your cardiac medications before your next appointment, please call your pharmacy*   Lab Work: None ordered.  If you have labs (blood work) drawn today and your tests are completely normal, you will receive your results only by: [place] (if you have MyChart) OR A paper copy in the mail If you have any lab test that is abnormal or we need to change your treatment, we will call you to review the results.   Testing/Procedures: None ordered.    Follow-Up: At [facility], you and your health needs are our priority.  As part of our continuing mission to provide you with exceptional heart care, we have created designated Provider Care Teams.  These Care Teams include your primary Cardiologist (physician) and Advanced Practice Providers (APPs -  Physician Assistants and Nurse Practitioners) who all work together to provide you with the care you need, when you need it.  We recommend signing up for the patient portal called "MyChart".  Sign up information is provided on this After Visit Summary.  MyChart is used to connect with patients for Virtual Visits (Telemedicine).  Patients are able to view lab/test results, encounter notes, upcoming appointments, etc.  Non-urgent messages can be sent to your provider as well.   To learn more about what you can do with MyChart, go to [email or web address].    Your next appointment:   Follow up with Dr Caryl Comes in 9 months

## 2021-11-16 NOTE — Progress Notes (Signed)
Patient ID: Ashlee Warren, female   DOB: 02-20-1928, 88 y.o.   MRN: [id number]     88 y.o. chronic afib on Xarelto with no bleeding issues. Had [facility] to NSR 03/11/12 Flecainide stopped due to QRS widening On ETT. No history of CAD. EF has been normal  CHADVASC 4 Significant anxiety ans somatization  Carotid plaque with no stenosis duplex 07/24/17  Has chronic bifasicular block RBBB/LAFB    Had cystoscopy with right ureteral stent 09/16/20 Hematuria resolved Xarelto dose decreased 15 mg   08/10/21 admitted with CHB junctional escape K/Cr ok TSH normal TTE with normal EF Had Medtronic MRI safe PPM placed and cardizem d/c Due to afib had single lead device placed   Post PPM with some postural dizziness Memory and activity level less but this has been since her husband died Rate response turned on 11-28-2021   Married 76 years  [name] Husband passed 2021 she is trying to cope  She has 2 daughters in [place] and a son in [place]  No cardiac complaints   ROS: Denies fever, malais, weight loss, blurry vision, decreased visual acuity, cough, sputum, SOB, hemoptysis, pleuritic pain, palpitaitons, heartburn, abdominal pain, melena, lower extremity edema, claudication, or rash.  All other systems reviewed and negative  General: BP 112/72    Pulse 74    Ht 5\' 2"  (1.575 m)    Wt 111 lb 12.8 oz (50.7 kg)    SpO2 99%    BMI 20.45 kg/m  Affect appropriate Healthy:  appears stated age 89: normal Neck supple with no adenopathy JVP normal no bruits no thyromegaly Lungs clear with no wheezing and good diaphragmatic motion Heart:  S1/S2 no murmur, no rub, gallop or click PMI normal Abdomen: benighn, BS positve, no tenderness, no AAA no bruit.  No HSM or HJR left inguinal hernia not bulging  Distal pulses intact with no bruits No edema Neuro non-focal Skin warm and dry No muscular weakness   Current Outpatient Medications  Medication Sig Dispense Refill   acetaminophen (TYLENOL) 325 MG tablet Take  650 mg by mouth every 6 (six) hours as needed for moderate pain or headache.     diltiazem (CARDIZEM CD) 120 MG 24 hr capsule Take 1 capsule (120 mg total) by mouth daily. 30 capsule 11   Multiple Vitamins-Minerals (PRESERVISION AREDS 2+MULTI VIT PO) Take 1 capsule by mouth in the morning and at bedtime.      Rivaroxaban (XARELTO) 15 MG TABS tablet TAKE 1 TABLET (15 MG TOTAL) BY MOUTH DAILY WITH SUPPER. 90 tablet 1   No current facility-administered medications for this visit.    Allergies  Penicillins, Sulfonamide derivatives, and Tamsulosin   Electrocardiogram: 06/05/18 AFib rate 92 RBBB/LAFB no changes 12/14/19 afib rate 87 RBBB LAFB 11/21/2021 afib RBBB LAFB rate 88   Assessment and Plan  PAF:  CHADVASC 4 on xarelto low dose  no bleeding issues  PPM:  Medtronic single lead implanted October 2022 for CHB single lead device with chronic afib Rate response on   HTN:  Well controlled.  Continue current medications and low sodium Dash type diet.   Carotid:  Some plaque no stenosis  duplex 01/25/21  Depression: reactive to husbands death f/u [name]  Urology:   F/U Dr Abner Greenspan post cystoscopy with right ureteral stent no stone seen Held xarelto 2 days before without incident GI:  Right inguinal hernia. Not impressive on exam today but she indicates it can bulge out and be painful Surgery unlikely to be done with  her age and reactive depression   F/U in a  6 months with Dr Caryl Comes EP remote PPM checks F/U with me in a year   Jenkins Rouge

## 2021-11-21 ENCOUNTER — Other Ambulatory Visit: Payer: Self-pay

## 2021-11-21 ENCOUNTER — Encounter: Payer: Self-pay | Admitting: Cardiovascular Disease

## 2021-11-21 ENCOUNTER — Ambulatory Visit (INDEPENDENT_AMBULATORY_CARE_PROVIDER_SITE_OTHER): Payer: Medicare Other | Admitting: Cardiovascular Disease

## 2021-11-21 VITALS — BP 112/72 | HR 74 | Ht 62.0 in | Wt 111.8 lb

## 2021-11-21 DIAGNOSIS — Z5181 Encounter for therapeutic drug level monitoring: Secondary | ICD-10-CM

## 2021-11-21 DIAGNOSIS — Z7901 Long term (current) use of anticoagulants: Secondary | ICD-10-CM | POA: Diagnosis not present

## 2021-11-21 DIAGNOSIS — I4891 Unspecified atrial fibrillation: Secondary | ICD-10-CM

## 2021-11-21 DIAGNOSIS — Z950 Presence of cardiac pacemaker: Secondary | ICD-10-CM

## 2021-11-21 DIAGNOSIS — K4090 Unilateral inguinal hernia, without obstruction or gangrene, not specified as recurrent: Secondary | ICD-10-CM

## 2021-11-21 NOTE — Patient Instructions (Addendum)
Medication Instructions:  °Your physician recommends that you continue on your current medications as directed. Please refer to the Current Medication list given to you today. ° °*If you need a refill on your cardiac medications before your next appointment, please call your pharmacy* ° °Lab Work: °If you have labs (blood work) drawn today and your tests are completely normal, you will receive your results only by: °MyChart Message (if you have MyChart) OR °A paper copy in the mail °If you have any lab test that is abnormal or we need to change your treatment, we will call you to review the results. ° °Testing/Procedures: °None ordered today. ° °Follow-Up: °At [facility], you and your health needs are our priority.  As part of our continuing mission to provide you with exceptional heart care, we have created designated Provider Care Teams.  These Care Teams include your primary Cardiologist (physician) and Advanced Practice Providers (APPs -  Physician Assistants and Nurse Practitioners) who all work together to provide you with the care you need, when you need it. ° °We recommend signing up for the patient portal called "MyChart".  Sign up information is provided on this After Visit Summary.  MyChart is used to connect with patients for Virtual Visits (Telemedicine).  Patients are able to view lab/test results, encounter notes, upcoming appointments, etc.  Non-urgent messages can be sent to your provider as well.   °To learn more about what you can do with MyChart, go to [email or web address].   ° °Your next appointment:   °6 month(s) ° °The format for your next appointment:   °In Person ° °Provider:   °Peter Nishan, MD { ° ° °

## 2021-11-22 NOTE — Progress Notes (Signed)
Remote pacemaker transmission.   

## 2021-12-04 ENCOUNTER — Ambulatory Visit (INDEPENDENT_AMBULATORY_CARE_PROVIDER_SITE_OTHER): Payer: Medicare Other

## 2021-12-04 DIAGNOSIS — I4891 Unspecified atrial fibrillation: Secondary | ICD-10-CM

## 2021-12-04 DIAGNOSIS — I10 Essential (primary) hypertension: Secondary | ICD-10-CM

## 2021-12-04 DIAGNOSIS — R413 Other amnesia: Secondary | ICD-10-CM

## 2021-12-04 NOTE — Patient Instructions (Signed)
Visit Information  Thank you for taking time to visit with me today. Please don't hesitate to contact me if I can be of assistance to you before our next scheduled telephone appointment.  Following are the goals we discussed today:   Patient will self administer medications as prescribed Patient will attend all scheduled provider appointments Patient will call pharmacy for medication refills Patient will call provider office for new concerns or questions Will continue to work with clinical team to address health care and disease management needs.  Patient will monitor blood pressure at least 1-2 times a week and record results.  Notify provider for elevated blood pressures above 140/90 or less than 90/60.  Take your recorded blood pressure readings to your provider visits.  Notify your provider for signs/ symptoms of bleeding in urine or stool, from gums, excessive bruising  Avoid taking NSAID ( Advil, aleve, ibuprofen ) due to increase bleeding risk with anticoagulants ( blood thinning medications) Continue to monitor your pulse/ oxygen level and record.  Avoid standing from sitting/ laying down quickly which can contribute to further dizziness. ( Notify your doctor if symptoms worsen) Contact your RN case manager or social worker if counseling and/ or additional community resources is needed.   Our next appointment is by telephone on 01/08/22 at 10 am  Please call the care guide team at [phone] if you need to cancel or reschedule your appointment.   If you are experiencing a Mental Health or [place] or need someone to talk to, please call the Suicide and Crisis Lifeline: 988 call 1-800-273-TALK (toll free, 24 hour hotline)   Patient verbalizes understanding of instructions and care plan provided today and agrees to view in [place]. Active MyChart status confirmed with patient.    Quinn Plowman RN,BSN,CCM RN Case Manager [place]  [phone]

## 2021-12-04 NOTE — Chronic Care Management (AMB) (Signed)
Chronic Care Management   CCM RN Visit Note  12/04/2021 Name: Ashlee Warren MRN: [id number] DOB: [birth year]  Subjective: Ashlee Warren is a 88 y.o. year old female who is a primary care patient of Tonia Ghent, MD. The care management team was consulted for assistance with disease management and care coordination needs.    Engaged with patient by telephone for follow up visit in response to provider referral for case management and/or care coordination services.   Consent to Services:  The patient was given information about Chronic Care Management services, agreed to services, and gave verbal consent prior to initiation of services.  Please see initial visit note for detailed documentation.   Patient agreed to services and verbal consent obtained.   Assessment: Review of patient past medical history, allergies, medications, health status, including review of consultants reports, laboratory and other test data, was performed as part of comprehensive evaluation and provision of chronic care management services.   SDOH (Social Determinants of Health) assessments and interventions performed:    CCM Care Plan  Allergies  Allergen Reactions   Penicillins Rash   Sulfonamide Derivatives Rash   Tamsulosin Swelling    Swollen Lips    Outpatient Encounter Medications as of 12/04/2021  Medication Sig   acetaminophen (TYLENOL) 325 MG tablet Take 650 mg by mouth every 6 (six) hours as needed for moderate pain or headache.   diltiazem (CARDIZEM CD) 120 MG 24 hr capsule Take 1 capsule (120 mg total) by mouth daily.   Multiple Vitamins-Minerals (PRESERVISION AREDS 2+MULTI VIT PO) Take 1 capsule by mouth in the morning and at bedtime.    Rivaroxaban (XARELTO) 15 MG TABS tablet TAKE 1 TABLET (15 MG TOTAL) BY MOUTH DAILY WITH SUPPER.   No facility-administered encounter medications on file as of 12/04/2021.    Patient Active Problem List   Diagnosis Date Noted   Pacemaker 11/14/2021    Memory change 10/22/2021   Bradycardia 07/23/2021   Impacted cerumen of right ear 04/23/2021   Aortic atherosclerosis ([place]) 07/03/2020   Coronary atherosclerosis 07/03/2020   Hematuria 06/29/2020   Medicare annual wellness visit, subsequent 06/08/2019   Bursitis 04/05/2019   Edema 04/05/2019   Right hip pain 06/20/2018   Dysfunction of right eustachian tube 03/25/2018   Zoster 05/07/2017   Advance care planning 10/27/2015   Depression 07/28/2014   Carotid bruit 06/08/2013   Atrial fibrillation with slow ventricular response ([place]) 09/30/2012   Essential hypertension 09/30/2012    Conditions to be addressed/monitored:Atrial Fibrillation, HTN, and Family concerns  Care Plan : RN Care Manager Plan of Care  Updates made by Dannielle Karvonen, RN since 12/04/2021 12:00 AM     Problem: Chronic Disease management education and care coordination needs ( A-fib, Hypertension_   Priority: High     Long-Range Goal: Development of plan of care to address crhonic disease management and care coordination needs ( a-fib, hypertension)   Start Date: 08/21/2021  Expected End Date: 02/09/2022  Priority: High  Note:   Current Barriers:  Knowledge Deficits related to plan of care for management of Atrial Fibrillation and HTN Chronic Disease Management support and education needs related to Atrial Fibrillation and HTN Patient states she is doing ok.  She states she continues to monitor her pulse, oxygen level and blood pressure.  Today's readings were:  P-70, O2  99% and blood pressure was 126/77.  Patient confirms having follow up visits with her cardiologist Dr. Caryl Comes on 11/14/21, [name] on 11/21/21 and her primary  provider on 10/19/21.  Patient states she still has some tenderness at her pacemaker incision site.  Infection symptoms reviewed.  Patient denied any signs/ symptoms of infection.  Patient states her jitteriness comes and goes and states she still has some dizziness.  RNCM reviewed fall  precautions with patient. Patient verbalized understanding. Patient reports she was dumping the water out of her outside trash can due to rain and slid down.  She denies any serious injury.  She states she sustained a small scrap to  her left hand and hit her chin with the trash can lid.  Patient reports she and her daughter went to her appointment with her primary provider on 10/19/21 to assess her memory.  She states she was given the ok by her doctor to continue self management of her medications. Patient states she feels very good about this and feels it put her daughters mind at ease as well.    RNCM Clinical Goal(s):  Patient will verbalize basic understanding of  Atrial Fibrillation and HTN disease process and self health management plan   take all medications exactly as prescribed and will call provider for medication related questions attend all scheduled medical appointments:   continue to work with RN Care Manager to address care management and care coordination needs related to  Atrial Fibrillation and HTN through collaboration with RN Care manager, provider, and care team.  Monitor blood pressure and pulse daily  Interventions: 1:1 collaboration with primary care provider regarding development and update of comprehensive plan of care as evidenced by provider attestation and co-signature Inter-disciplinary care team collaboration (see longitudinal plan of care) Evaluation of current treatment plan related to  self management and patient's adherence to plan as established by provider  Hypertension Interventions: Goal on track:  Long term  Last practice recorded BP readings:  BP Readings from Last 3 Encounters:  08/12/21 (!) 123/31  07/21/21 104/62  05/24/21 112/68  Most recent eGFR/CrCl:  Lab Results  Component Value Date   EGFR 50 (L) 05/24/2021    No components found for: CRCL  Evaluation of current treatment plan related to hypertension self management and patient's adherence to  plan as established by provider; Provided education to patient re: stroke prevention, s/s of heart attack and stroke; Reviewed medications with patient and discussed importance of compliance; Discussed plans with patient for ongoing care management follow up and provided patient with direct contact information for care management team; Reviewed scheduled/upcoming provider appointments including:  Advised to continue to monitor blood pressure 1-2 times per week and record.  Reviewed fall precautions with patient.  Advised to not stand quickly from sitting or laying down but to sit for a few minutes before standing up to quickly.   AFIB Interventions: Goal on track: Long term   Counseled on increased risk of stroke due to Afib and benefits of anticoagulation for stroke prevention; Reviewed importance of adherence to anticoagulant exactly as prescribed; Counseled on seeking medical attention after a head injury or if there is blood in the urine/stool; Advised to continue to monitor pulse/ O2 saturation.  Notify provider for provider of concerns/ questions  Family concerns/ Memory: Goal on track :  Longterm Allowed patient to verbalize concerns and feelings Provided emotional support Advised patient to contact her RN case manager or social worker if assistance is needed for counseling and/ or additional community resources.    Patient Goals/Self-Care Activities: Patient will self administer medications as prescribed Patient will attend all scheduled provider appointments Patient will  call pharmacy for medication refills Patient will call provider office for new concerns or questions Will continue to work with clinical team to address health care and disease management needs.  Patient will monitor blood pressure at least 1-2 times a week and record results.  Notify provider for elevated blood pressures above 140/90 or less than 90/60.  Take your recorded blood pressure readings to your provider  visits.  Notify your provider for signs/ symptoms of bleeding in urine or stool, from gums, excessive bruising  Avoid taking NSAID ( Advil, aleve, ibuprofen ) due to increase bleeding risk with anticoagulants ( blood thinning medications) Continue to monitor your pulse/ oxygen level and record.  Avoid standing from sitting/ laying down quickly which can contribute to further dizziness. ( Notify your doctor if symptoms worsen) Contact your RN case manager or social worker if counseling and/ or additional community resources is needed.        Plan:The patient has been provided with contact information for the care management team and has been advised to call with any health related questions or concerns.  The care management team will reach out to the patient again over the next 45 days. Quinn Plowman RN,BSN,CCM RN Case Manager [place]  [phone]

## 2021-12-12 DIAGNOSIS — I10 Essential (primary) hypertension: Secondary | ICD-10-CM

## 2021-12-12 DIAGNOSIS — I4891 Unspecified atrial fibrillation: Secondary | ICD-10-CM

## 2021-12-27 ENCOUNTER — Telehealth: Payer: Self-pay | Admitting: *Deleted

## 2021-12-27 DIAGNOSIS — K4090 Unilateral inguinal hernia, without obstruction or gangrene, not specified as recurrent: Secondary | ICD-10-CM | POA: Diagnosis not present

## 2021-12-27 NOTE — Telephone Encounter (Signed)
Clinical pharmacist to review Xarelto 

## 2021-12-27 NOTE — Telephone Encounter (Signed)
? ?  Pre-operative Risk Assessment  ?  ?Patient Name: Ashlee Warren  ?DOB: 12/08/1927 ?MRN: [id number]  ? ?  ? ?Request for Surgical Clearance   ? ?Procedure:   OPEN RIGHT INGUINAL HERNIA ? ?Date of Surgery:  Clearance TBD                              ?   ?Surgeon:  Tilman Neat, MD ?Surgeon's Group or Practice Name:  [facility] ?Phone number:  [phone] ?Fax number:  [phone] ?  ?Type of Clearance Requested:   ?- Pharmacy:  Hold Rivaroxaban (Xarelto) 3 DAYS ?  ?Type of Anesthesia:  Not Indicated ?  ?Additional requests/questions:   ? ?Signed, ?Jeanann Lewandowsky   ?12/27/2021, 3:07 PM  ? ?

## 2021-12-28 NOTE — Telephone Encounter (Signed)
? ? ?  Patient Name: Ashlee Warren  ?DOB: [birth year] ?MRN: [id number] ? ?Primary Cardiologist: Jenkins Rouge, MD ? ?Chart reviewed as part of pre-operative protocol coverage. Patent was recently seen by [name] on 11/21/2021 at which time she was doing well without any cardiac complaints. Per [name], "OK to proceed with surgery." ? ?Per Pharmacy and office protocol, "patient can hold Xarelto for 3 days prior to procedure as requested." Please restart this as soon as safely possible after procedure.  ? ?I will route this recommendation to the requesting party via Epic fax function and remove from pre-op pool. ? ?Please call with questions. ? ?Darreld Mclean, PA-C ?12/28/2021, 5:48 PM ? ?

## 2021-12-28 NOTE — Telephone Encounter (Signed)
Hi [name], ? ?Ms. Lukes has an upcoming open right inguinal hernia repair surgery planned. You recently saw her on 11/21/2021 at which time she was doing well without any cardiac complaints. At that time, the thought was that hernia surgery was unlikely due to her age and reactive depression. Do you feel like she is OK to proceed with surgery? ? ?Please route response back to P [facility] DIV PREOP. ? ?Thank you! ?Genaro Bekker ?

## 2021-12-28 NOTE — Telephone Encounter (Signed)
Patient with diagnosis of afib on Xarelto for anticoagulation.   ? ?Procedure: open right inguinal hernia ?Date of procedure: TBD ? ?CHA2DS2-VASc Score = 5  ?This indicates a 7.2% annual risk of stroke. ?The patient's score is based upon: ?CHF History: 0 ?HTN History: 1 ?Diabetes History: 0 ?Stroke History: 0 ?Vascular Disease History: 1 ?Age Score: 2 ?Gender Score: 1 ?  ?CrCl 36m/min ?Platelet count 293K ? ?Per office protocol, patient can hold Xarelto for 3 days prior to procedure as requested.   ?

## 2022-01-02 DIAGNOSIS — Z882 Allergy status to sulfonamides status: Secondary | ICD-10-CM | POA: Diagnosis not present

## 2022-01-02 DIAGNOSIS — K4090 Unilateral inguinal hernia, without obstruction or gangrene, not specified as recurrent: Secondary | ICD-10-CM | POA: Diagnosis not present

## 2022-01-02 DIAGNOSIS — Z880 Allergy status to penicillin: Secondary | ICD-10-CM | POA: Diagnosis not present

## 2022-01-02 DIAGNOSIS — K4030 Unilateral inguinal hernia, with obstruction, without gangrene, not specified as recurrent: Secondary | ICD-10-CM | POA: Diagnosis not present

## 2022-01-02 DIAGNOSIS — Z79899 Other long term (current) drug therapy: Secondary | ICD-10-CM | POA: Diagnosis not present

## 2022-01-02 DIAGNOSIS — Z950 Presence of cardiac pacemaker: Secondary | ICD-10-CM | POA: Diagnosis not present

## 2022-01-02 DIAGNOSIS — Z7901 Long term (current) use of anticoagulants: Secondary | ICD-10-CM | POA: Diagnosis not present

## 2022-01-02 DIAGNOSIS — Z888 Allergy status to other drugs, medicaments and biological substances status: Secondary | ICD-10-CM | POA: Diagnosis not present

## 2022-01-02 DIAGNOSIS — I10 Essential (primary) hypertension: Secondary | ICD-10-CM | POA: Diagnosis not present

## 2022-01-02 DIAGNOSIS — Z95810 Presence of automatic (implantable) cardiac defibrillator: Secondary | ICD-10-CM | POA: Diagnosis not present

## 2022-01-02 DIAGNOSIS — I482 Chronic atrial fibrillation, unspecified: Secondary | ICD-10-CM | POA: Diagnosis not present

## 2022-01-08 ENCOUNTER — Ambulatory Visit (INDEPENDENT_AMBULATORY_CARE_PROVIDER_SITE_OTHER): Payer: Medicare Other

## 2022-01-08 DIAGNOSIS — R413 Other amnesia: Secondary | ICD-10-CM

## 2022-01-08 DIAGNOSIS — I4891 Unspecified atrial fibrillation: Secondary | ICD-10-CM

## 2022-01-08 DIAGNOSIS — I10 Essential (primary) hypertension: Secondary | ICD-10-CM

## 2022-01-08 NOTE — Chronic Care Management (AMB) (Signed)
?Chronic Care Management  ? ?CCM RN Visit Note ? ?01/08/2022 ?Name: Ashlee Warren MRN: [id number] DOB: 04-30-1928 ? ?Subjective: ?Ashlee Warren is a 88 y.o. year old female who is a primary care patient of Tonia Ghent, MD. The care management team was consulted for assistance with disease management and care coordination needs.   ? ?Engaged with patient by telephone for follow up visit in response to provider referral for case management and/or care coordination services.  ? ?Consent to Services:  ?The patient was given information about Chronic Care Management services, agreed to services, and gave verbal consent prior to initiation of services.  Please see initial visit note for detailed documentation.  ? ?Patient agreed to services and verbal consent obtained.  ? ?Assessment: Review of patient past medical history, allergies, medications, health status, including review of consultants reports, laboratory and other test data, was performed as part of comprehensive evaluation and provision of chronic care management services.  ? ?SDOH (Social Determinants of Health) assessments and interventions performed:   ? ?CCM Care Plan ? ?Allergies  ?Allergen Reactions  ? Penicillins Rash  ? Sulfonamide Derivatives Rash  ? Tamsulosin Swelling  ?  Swollen Lips  ? ? ?Outpatient Encounter Medications as of 01/08/2022  ?Medication Sig  ? acetaminophen (TYLENOL) 325 MG tablet Take 650 mg by mouth every 6 (six) hours as needed for moderate pain or headache.  ? diltiazem (CARDIZEM CD) 120 MG 24 hr capsule Take 1 capsule (120 mg total) by mouth daily.  ? Multiple Vitamins-Minerals (PRESERVISION AREDS 2+MULTI VIT PO) Take 1 capsule by mouth in the morning and at bedtime.   ? Rivaroxaban (XARELTO) 15 MG TABS tablet TAKE 1 TABLET (15 MG TOTAL) BY MOUTH DAILY WITH SUPPER.  ? ?No facility-administered encounter medications on file as of 01/08/2022.  ? ? ?Patient Active Problem List  ? Diagnosis Date Noted  ? Pacemaker 11/14/2021  ?  Memory change 10/22/2021  ? Bradycardia 07/23/2021  ? Impacted cerumen of right ear 04/23/2021  ? Aortic atherosclerosis ([place]) 07/03/2020  ? Coronary atherosclerosis 07/03/2020  ? Hematuria 06/29/2020  ? Medicare annual wellness visit, subsequent 06/08/2019  ? Bursitis 04/05/2019  ? Edema 04/05/2019  ? Right hip pain 06/20/2018  ? Dysfunction of right eustachian tube 03/25/2018  ? Zoster 05/07/2017  ? Advance care planning 10/27/2015  ? Depression 07/28/2014  ? Carotid bruit 06/08/2013  ? Atrial fibrillation with slow ventricular response ([place]) 09/30/2012  ? Essential hypertension 09/30/2012  ? ? ?Conditions to be addressed/monitored:Atrial Fibrillation, HTN, and memory ? ?Care Plan : RN Care Manager Plan of Care  ?Updates made by Dannielle Karvonen, RN since 01/08/2022 12:00 AM  ?  ? ?Problem: Chronic Disease management education and care coordination needs ( A-fib, Hypertension_   ?Priority: High  ?  ? ?Long-Range Goal: Development of plan of care to address crhonic disease management and care coordination needs ( a-fib, hypertension)   ?Start Date: 08/21/2021  ?Expected End Date: 03/14/2022  ?Priority: High  ?Note:   ?Current Barriers:  ?Knowledge Deficits related to plan of care for management of Atrial Fibrillation and HTN ?Chronic Disease Management support and education needs related to Atrial Fibrillation and HTN ?Patient states she is doing well. She reports having hernia repair on 01/02/22.  She states she is doing well from the surgery. RNCM reviewed signs/ symptoms of infection with patient. Patient denies having any symptoms. Patient reports next follow up visit with surgeon is 01/15/22.     ? ?RNCM Clinical Goal(s):  ?Patient  will verbalize basic understanding of  Atrial Fibrillation and HTN disease process and self health management plan   ?take all medications exactly as prescribed and will call provider for medication related questions ?attend all scheduled medical appointments:   ?continue to work with RN  Care Manager to address care management and care coordination needs related to  Atrial Fibrillation and HTN through collaboration with RN Care manager, provider, and care team.  ?Monitor blood pressure and pulse daily ? ?Interventions: ?1:1 collaboration with primary care provider regarding development and update of comprehensive plan of care as evidenced by provider attestation and co-signature ?Inter-disciplinary care team collaboration (see longitudinal plan of care) ?Evaluation of current treatment plan related to  self management and patient's adherence to plan as established by provider ? ?Hypertension Interventions: Goal on track:Yes    Long term  ?Last practice recorded BP readings:  ?BP Readings from Last 3 Encounters:  ?11/21/21 112/72  ?11/14/21 136/70  ?10/19/21 118/68  ?Most recent eGFR/CrCl:  ?Lab Results  ?Component Value Date  ? EGFR 50 (L) 05/24/2021  ?  No components found for: CRCL ? ?Evaluation of current treatment plan related to hypertension self management and patient's adherence to plan as established by provider ?Reviewed medications with patient and discussed importance of compliance ?Discussed plans with patient for ongoing care management follow up and provided patient with direct contact information for care management team; ?Reviewed scheduled/upcoming provider appointments  ?Advised to continue to monitor blood pressure at least 1-2 times per week and record.  ? ?AFIB Interventions: Goal on track:  Yes,  Long term ?     Reviewed atrial fibrillation signs/ symptoms ?Reviewed importance of adherence to anticoagulant exactly as prescribed; ?Counseled on seeking medical attention after a fall or if there is blood in the urine/stool ?Advised to continue to monitor pulse/ O2 saturation.  Notify provider for provider of concerns/ questions ? ?Family concerns/ Memory: Goal on track:  Yes,  Longterm ?Allowed patient to verbalize concerns and feelings ?Provided emotional support ?Advised patient  to contact her RN case Freight forwarder or social worker if assistance is needed for counseling and/ or additional community resources.  ? ? ?Patient Goals/Self-Care Activities: ?Continue to take medications as prescribed ?Attend all scheduled provider appointments ?Call pharmacy for medication refills timely ?Call provider office for new concerns or questions ?Continue to work with clinical team to address health care and disease management needs.  ?Continue to monitor blood pressure at least 1-2 times a week and record results.  Notify provider for elevated blood pressures above 140/90 or less than 90/60.  Take your recorded blood pressure readings to your provider visits.  ?Notify your provider for signs/ symptoms of bleeding in urine or stool, from gums, excessive bruising  ?Avoid taking NSAID ( Advil, aleve, ibuprofen ) due to increase bleeding risk with anticoagulants ( blood thinning medications) ?Continue to monitor your pulse/ oxygen level and record.  ? ? ?  ? ? ?Plan:The patient has been provided with contact information for the care management team and has been advised to call with any health related questions or concerns.  ?The care management team will reach out to the patient again over the next 1-2 months. ?Quinn Plowman RN,BSN,CCM ?RN Case Manager ?[place]  ?[phone] ? ? ? ? ? ? ? ? ? ?

## 2022-01-08 NOTE — Patient Instructions (Signed)
Visit Information ? ?Thank you for taking time to visit with me today. Please don't hesitate to contact me if I can be of assistance to you before our next scheduled telephone appointment. ? ?Following are the goals we discussed today:  ?Continue to take medications as prescribed ?Attend all scheduled provider appointments ?Call pharmacy for medication refills timely ?Call provider office for new concerns or questions ?Continue to work with clinical team to address health care and disease management needs.  ?Continue to monitor blood pressure at least 1-2 times a week and record results.  Notify provider for elevated blood pressures above 140/90 or less than 90/60.  Take your recorded blood pressure readings to your provider visits.  ?Notify your provider for signs/ symptoms of bleeding in urine or stool, from gums, excessive bruising  ?Avoid taking NSAID ( Advil, aleve, ibuprofen ) due to increase bleeding risk with anticoagulants ( blood thinning medications) ?Continue to monitor your pulse/ oxygen level and record.  ? ?Our next appointment is by telephone on 02/19/22 at 11:00 am ? ?Please call the care guide team at [phone] if you need to cancel or reschedule your appointment.  ? ?If you are experiencing a Mental Health or [place] or need someone to talk to, please call the Suicide and Crisis Lifeline: 988 ?call 1-800-273-TALK (toll free, 24 hour hotline)  ? ?Patient verbalizes understanding of instructions and care plan provided today and agrees to view in [place]. Active MyChart status confirmed with patient.   ? ?Quinn Plowman RN,BSN,CCM ?RN Case Manager ?[place]  ?[phone] ? ?

## 2022-01-12 DIAGNOSIS — I4891 Unspecified atrial fibrillation: Secondary | ICD-10-CM

## 2022-01-12 DIAGNOSIS — I10 Essential (primary) hypertension: Secondary | ICD-10-CM

## 2022-01-16 DIAGNOSIS — X32XXXD Exposure to sunlight, subsequent encounter: Secondary | ICD-10-CM | POA: Diagnosis not present

## 2022-01-16 DIAGNOSIS — L570 Actinic keratosis: Secondary | ICD-10-CM | POA: Diagnosis not present

## 2022-01-16 DIAGNOSIS — D225 Melanocytic nevi of trunk: Secondary | ICD-10-CM | POA: Diagnosis not present

## 2022-01-31 ENCOUNTER — Other Ambulatory Visit: Payer: Self-pay | Admitting: Family Medicine

## 2022-02-13 ENCOUNTER — Ambulatory Visit (INDEPENDENT_AMBULATORY_CARE_PROVIDER_SITE_OTHER): Payer: Medicare Other

## 2022-02-13 DIAGNOSIS — I442 Atrioventricular block, complete: Secondary | ICD-10-CM | POA: Diagnosis not present

## 2022-02-13 LAB — CUP PACEART REMOTE DEVICE CHECK
Battery Remaining Longevity: 162 mo
Battery Voltage: 3.14 V
Brady Statistic RV Percent Paced: 99.78 %
Date Time Interrogation Session: 20230502035706
Implantable Lead Implant Date: 20221028
Implantable Lead Location: 753860
Implantable Lead Model: 1948
Implantable Pulse Generator Implant Date: 20221028
Lead Channel Impedance Value: 475 Ohm
Lead Channel Impedance Value: 703 Ohm
Lead Channel Pacing Threshold Amplitude: 0.625 V
Lead Channel Pacing Threshold Pulse Width: 0.4 ms
Lead Channel Sensing Intrinsic Amplitude: 11.625 mV
Lead Channel Sensing Intrinsic Amplitude: 21.75 mV
Lead Channel Setting Pacing Amplitude: 2 V
Lead Channel Setting Pacing Pulse Width: 0.4 ms
Lead Channel Setting Sensing Sensitivity: 0.9 mV

## 2022-02-19 ENCOUNTER — Ambulatory Visit (INDEPENDENT_AMBULATORY_CARE_PROVIDER_SITE_OTHER): Payer: Medicare Other

## 2022-02-19 DIAGNOSIS — I4891 Unspecified atrial fibrillation: Secondary | ICD-10-CM

## 2022-02-19 DIAGNOSIS — I10 Essential (primary) hypertension: Secondary | ICD-10-CM

## 2022-02-19 NOTE — Chronic Care Management (AMB) (Signed)
?Chronic Care Management  ? ?CCM RN Visit Note ? ?02/19/2022 ?Name: Ashlee Warren MRN: [id number] DOB: 1928/04/01 ? ?Subjective: ?Ashlee Warren is a 88 y.o. year old female who is a primary care patient of Tonia Ghent, MD. The care management team was consulted for assistance with disease management and care coordination needs.   ? ?Engaged with patient by telephone for follow up visit in response to provider referral for case management and/or care coordination services.  ? ?Consent to Services:  ?The patient was given information about Chronic Care Management services, agreed to services, and gave verbal consent prior to initiation of services.  Please see initial visit note for detailed documentation.  ? ?Patient agreed to services and verbal consent obtained.  ? ?Assessment: Review of patient past medical history, allergies, medications, health status, including review of consultants reports, laboratory and other test data, was performed as part of comprehensive evaluation and provision of chronic care management services.  ? ?SDOH (Social Determinants of Health) assessments and interventions performed:   ? ?CCM Care Plan ? ?Allergies  ?Allergen Reactions  ? Penicillins Rash  ? Sulfonamide Derivatives Rash  ? Tamsulosin Swelling  ?  Swollen Lips  ? ? ?Outpatient Encounter Medications as of 02/19/2022  ?Medication Sig  ? acetaminophen (TYLENOL) 325 MG tablet Take 650 mg by mouth every 6 (six) hours as needed for moderate pain or headache.  ? diltiazem (CARDIZEM CD) 120 MG 24 hr capsule Take 1 capsule (120 mg total) by mouth daily.  ? Multiple Vitamins-Minerals (PRESERVISION AREDS 2+MULTI VIT PO) Take 1 capsule by mouth in the morning and at bedtime.   ? XARELTO 15 MG TABS tablet TAKE 1 TABLET (15 MG TOTAL) BY MOUTH DAILY WITH SUPPER.  ? ?No facility-administered encounter medications on file as of 02/19/2022.  ? ? ?Patient Active Problem List  ? Diagnosis Date Noted  ? Pacemaker 11/14/2021  ? Memory change  10/22/2021  ? Bradycardia 07/23/2021  ? Impacted cerumen of right ear 04/23/2021  ? Aortic atherosclerosis ([place]) 07/03/2020  ? Coronary atherosclerosis 07/03/2020  ? Hematuria 06/29/2020  ? Medicare annual wellness visit, subsequent 06/08/2019  ? Bursitis 04/05/2019  ? Edema 04/05/2019  ? Right hip pain 06/20/2018  ? Dysfunction of right eustachian tube 03/25/2018  ? Zoster 05/07/2017  ? Advance care planning 10/27/2015  ? Depression 07/28/2014  ? Carotid bruit 06/08/2013  ? Atrial fibrillation with slow ventricular response ([place]) 09/30/2012  ? Essential hypertension 09/30/2012  ? ? ?Conditions to be addressed/monitored:Atrial Fibrillation and HTN ? ?Care Plan : RN Care Manager Plan of Care  ?Updates made by Dannielle Karvonen, RN since 02/19/2022 12:00 AM  ?  ? ?Problem: Chronic Disease management education and care coordination needs ( A-fib, Hypertension_   ?Priority: High  ?  ? ?Long-Range Goal: Development of plan of care to address crhonic disease management and care coordination needs ( a-fib, hypertension)   ?Start Date: 08/21/2021  ?Expected End Date: 05/14/2022  ?Priority: High  ?Note:   ?Current Barriers:  ?Knowledge Deficits related to plan of care for management of Atrial Fibrillation and HTN ?Chronic Disease Management support and education needs related to Atrial Fibrillation and HTN ?Patient states she is doing well. She reports having follow up with general surgeon regarding her hernia repair on 01/15/22 and has now been released from service since all has gone well with her progress.  Patient reports blood pressure reading have ranged from 109/60 - 149/80.  Patient states she uses her wrist blood pressure cuff for  monitoring.  Patient reports her pulse has ranged in the 70's and her oxygen level ranges from 98-99.  Denies any concerns at this time.   ?RNCM Clinical Goal(s):  ?Patient will verbalize basic understanding of  Atrial Fibrillation and HTN disease process and self health management plan   ?take  all medications exactly as prescribed and will call provider for medication related questions ?attend all scheduled medical appointments:   ?continue to work with RN Care Manager to address care management and care coordination needs related to  Atrial Fibrillation and HTN through collaboration with RN Care manager, provider, and care team.  ?Monitor blood pressure and pulse daily ? ?Interventions: ?1:1 collaboration with primary care provider regarding development and update of comprehensive plan of care as evidenced by provider attestation and co-signature ?Inter-disciplinary care team collaboration (see longitudinal plan of care) ?Evaluation of current treatment plan related to  self management and patient's adherence to plan as established by provider ? ?Hypertension Interventions: Goal on track:Yes    Long term  ?Last practice recorded BP readings:  ?BP Readings from Last 3 Encounters:  ?11/21/21 112/72  ?11/14/21 136/70  ?10/19/21 118/68  ?Most recent eGFR/CrCl:  ?Lab Results  ?Component Value Date  ? EGFR 50 (L) 05/24/2021  ?  No components found for: CRCL ? ?Evaluation of current treatment plan related to hypertension self management and patient's adherence to plan as established by provider ?Reviewed medications with patient and discussed importance of compliance ?Discussed plans with patient for ongoing care management follow up and provided patient with direct contact information for care management team; ?Reviewed scheduled/upcoming provider appointments  ?Advised to continue to monitor blood pressure at least 1-2 times per week and record.  ?Advised to follow a low salt diet.  ? ?AFIB Interventions: Goal on track:  Yes,  Long term ?     Reviewed atrial fibrillation signs/ symptoms ?Reviewed importance of adherence to anticoagulant exactly as prescribed ?Counseled on seeking medical attention after a fall or if there is blood in the urine/stool ?Advised to continue to monitor pulse/ O2 saturation.   Notify provider for provider of concerns/ questions ? ?Family concerns/ Memory: Goal on track:  Goal met,  Longterm ?Allowed patient to verbalize concerns and feelings ?Provided emotional support ?Advised patient to contact her RN case Freight forwarder or social worker if assistance is needed for counseling and/ or additional community resources.  ? ? ?Patient Goals/Self-Care Activities: ?Continue to take medications as prescribed ?Attend all scheduled provider appointments ?Call pharmacy for medication refills timely ?Call provider office for new concerns or questions ?Continue to monitor blood pressure at least 1-2 times a week and record results.  Notify provider for elevated blood pressures above 140/90 or less than 90/60.  Take your recorded blood pressure readings to your provider visits.  ?Notify your provider for signs/ symptoms of bleeding in urine or stool, from gums, excessive bruising  ?Avoid taking NSAID ( Advil, aleve, ibuprofen ) due to increase bleeding risk with anticoagulants ( blood thinning medications) ?Continue to monitor your pulse/ oxygen level and record.  ?Notify your provider for mild symptoms/ call 911 for severe symptoms related to atrial fibrillation:  general fatigue,  rapid heart beat, dizziness, shortness of breath, faintness ? ? ?  ? ? ?Plan:The patient has been provided with contact information for the care management team and has been advised to call with any health related questions or concerns.  ?The care management team will reach out to the patient again over the next 2 months . ?  Quinn Plowman RN,BSN,CCM ?RN Case Manager ?[place]  ?[phone] ? ? ? ? ? ? ? ? ? ?

## 2022-02-19 NOTE — Patient Instructions (Signed)
Visit Information ? ?Thank you for taking time to visit with me today. Please don't hesitate to contact me if I can be of assistance to you before our next scheduled telephone appointment. ? ?Following are the goals we discussed today:  ?Continue to take medications as prescribed ?Attend all scheduled provider appointments ?Call pharmacy for medication refills timely ?Call provider office for new concerns or questions ?Continue to monitor blood pressure at least 1-2 times a week and record results.  Notify provider for elevated blood pressures above 140/90 or less than 90/60.  Take your recorded blood pressure readings to your provider visits.  ?Notify your provider for signs/ symptoms of bleeding in urine or stool, from gums, excessive bruising  ?Avoid taking NSAID ( Advil, aleve, ibuprofen ) due to increase bleeding risk with anticoagulants ( blood thinning medications) ?Continue to monitor your pulse/ oxygen level and record.  ?Notify your provider for mild symptoms/ call 911 for severe symptoms related to atrial fibrillation:  general fatigue,  rapid heart beat, dizziness, shortness of breath, faintness ? ?Our next appointment is by telephone on 05/04/22 at 10:00 am ? ?Please call the care guide team at [phone] if you need to cancel or reschedule your appointment.  ? ?If you are experiencing a Mental Health or [place] or need someone to talk to, please call the Suicide and Crisis Lifeline: 988 ?call 1-800-273-TALK (toll free, 24 hour hotline)  ? ?Patient verbalizes understanding of instructions and care plan provided today and agrees to view in [place]. Active MyChart status confirmed with patient.   ? ?Quinn Plowman RN,BSN,CCM ?RN Case Manager ?[place]  ?[phone] ? ?

## 2022-02-27 NOTE — Progress Notes (Signed)
Remote pacemaker transmission.   

## 2022-03-14 DIAGNOSIS — I4891 Unspecified atrial fibrillation: Secondary | ICD-10-CM

## 2022-03-14 DIAGNOSIS — I10 Essential (primary) hypertension: Secondary | ICD-10-CM

## 2022-04-16 ENCOUNTER — Ambulatory Visit (INDEPENDENT_AMBULATORY_CARE_PROVIDER_SITE_OTHER): Payer: Medicare Other

## 2022-04-16 DIAGNOSIS — I10 Essential (primary) hypertension: Secondary | ICD-10-CM

## 2022-04-16 DIAGNOSIS — I4891 Unspecified atrial fibrillation: Secondary | ICD-10-CM

## 2022-04-16 NOTE — Chronic Care Management (AMB) (Signed)
Chronic Care Management   CCM RN Visit Note  04/16/2022 Name: Ashlee Warren MRN: [id number] DOB: [birth year]  Subjective: Ashlee Warren is a 88 y.o. year old female who is a primary care patient of Tonia Ghent, MD. The care management team was consulted for assistance with disease management and care coordination needs.    Engaged with patient by telephone for follow up visit in response to provider referral for case management and/or care coordination services.   Consent to Services:  The patient was given information about Chronic Care Management services, agreed to services, and gave verbal consent prior to initiation of services.  Please see initial visit note for detailed documentation.   Patient agreed to services and verbal consent obtained.   Assessment: Review of patient past medical history, allergies, medications, health status, including review of consultants reports, laboratory and other test data, was performed as part of comprehensive evaluation and provision of chronic care management services.   SDOH (Social Determinants of Health) assessments and interventions performed:    CCM Care Plan  Allergies  Allergen Reactions   Penicillins Rash   Sulfonamide Derivatives Rash   Tamsulosin Swelling    Swollen Lips    Outpatient Encounter Medications as of 04/16/2022  Medication Sig   acetaminophen (TYLENOL) 325 MG tablet Take 650 mg by mouth every 6 (six) hours as needed for moderate pain or headache.   diltiazem (CARDIZEM CD) 120 MG 24 hr capsule Take 1 capsule (120 mg total) by mouth daily.   Multiple Vitamins-Minerals (PRESERVISION AREDS 2+MULTI VIT PO) Take 1 capsule by mouth in the morning and at bedtime.    XARELTO 15 MG TABS tablet TAKE 1 TABLET (15 MG TOTAL) BY MOUTH DAILY WITH SUPPER.   No facility-administered encounter medications on file as of 04/16/2022.    Patient Active Problem List   Diagnosis Date Noted   Pacemaker 11/14/2021   Memory change  10/22/2021   Bradycardia 07/23/2021   Impacted cerumen of right ear 04/23/2021   Aortic atherosclerosis ([place]) 07/03/2020   Coronary atherosclerosis 07/03/2020   Hematuria 06/29/2020   Medicare annual wellness visit, subsequent 06/08/2019   Bursitis 04/05/2019   Edema 04/05/2019   Right hip pain 06/20/2018   Dysfunction of right eustachian tube 03/25/2018   Zoster 05/07/2017   Advance care planning 10/27/2015   Depression 07/28/2014   Carotid bruit 06/08/2013   Atrial fibrillation with slow ventricular response ([place]) 09/30/2012   Essential hypertension 09/30/2012    Conditions to be addressed/monitored:Atrial Fibrillation and HTN  Care Plan : RN Care Manager Plan of Care  Updates made by Dannielle Karvonen, RN since 04/16/2022 12:00 AM     Problem: Chronic Disease management education and care coordination needs ( A-fib, Hypertension_   Priority: High     Long-Range Goal: Development of plan of care to address crhonic disease management and care coordination needs ( a-fib, hypertension)   Start Date: 08/21/2021  Expected End Date: 06/14/2022  Priority: High  Note:   Current Barriers:  Knowledge Deficits related to plan of care for management of Atrial Fibrillation and HTN Chronic Disease Management support and education needs related to Atrial Fibrillation and HTN Patient reports blood pressures range from 119/63 to 143/81.  She states she has occasional dizziness and lightheadedness when standing and sometimes just with movement. Denies any falls.  Patient denies any atrial fibrillation symptoms.  RNCM Clinical Goal(s):  Patient will verbalize basic understanding of  Atrial Fibrillation and HTN disease process and self health management plan  take all medications exactly as prescribed and will call provider for medication related questions attend all scheduled medical appointments:   continue to work with RN Care Manager to address care management and care coordination needs  related to  Atrial Fibrillation and HTN through collaboration with RN Care manager, provider, and care team.  Monitor blood pressure and pulse daily  Interventions: 1:1 collaboration with primary care provider regarding development and update of comprehensive plan of care as evidenced by provider attestation and co-signature Inter-disciplinary care team collaboration (see longitudinal plan of care) Evaluation of current treatment plan related to  self management and patient's adherence to plan as established by provider  Hypertension Interventions: Goal on track:Yes   Long term  Last practice recorded BP readings:  BP Readings from Last 3 Encounters:  11/21/21 112/72  11/14/21 136/70  10/19/21 118/68  Most recent eGFR/CrCl:  Lab Results  Component Value Date   EGFR 50 (L) 05/24/2021    No components found for: "CRCL"  Evaluation of current treatment plan related to hypertension self management and patient's adherence to plan as established by provider Reviewed medications with patient and discussed importance of compliance Discussed plans with patient for ongoing care management follow up and provided patient with direct contact information for care management team; Reviewed scheduled/upcoming provider appointments  Advised to continue to monitor blood pressure at least 1-2 times per week and record.  Advised to follow a low salt diet.  Advised to rise slowly from a sitting or lying position to manage dizziness/ lightheadedness symptoms.   AFIB Interventions: Goal on track:  Yes,  Long term      Reviewed atrial fibrillation signs/ symptoms Reviewed importance of adherence to anticoagulant exactly as prescribed Counseled on seeking medical attention  contact provider after a fall or if there is blood in the urine/stool.  Advised to continue to monitor pulse/ O2 saturation.     Patient Goals/Self-Care Activities: Continue to take medications as prescribed Attend all scheduled  provider appointments Refill medications timely Call provider office for new concerns or questions Continue to monitor blood pressure at least 1-2 times a week and record results.  Notify provider for elevated blood pressures above 140/90 or less than 90/60.   Notify your provider for signs/ symptoms of bleeding in urine or stool, from gums, excessive bruising  Avoid taking NSAID ( Advil, aleve, ibuprofen ) due to increase bleeding risk with anticoagulants ( blood thinning medications) Continue to monitor your pulse/ oxygen level and record.  Notify your provider for mild symptoms/ call 911 for severe symptoms related to atrial fibrillation:  general fatigue,  rapid heart beat, dizziness, shortness of breath, faintness       Plan:The patient has been provided with contact information for the care management team and has been advised to call with any health related questions or concerns.  The care management team will reach out to the patient again over the next 1-2 months . Quinn Plowman RN,BSN,CCM RN Case Manager [place]  [phone]

## 2022-04-16 NOTE — Patient Instructions (Signed)
Visit Information  Thank you for taking time to visit with me today. Please don't hesitate to contact me if I can be of assistance to you before our next scheduled telephone appointment.  Following are the goals we discussed today:  Continue to take medications as prescribed Attend all scheduled provider appointments Refill medications timely Call provider office for new concerns or questions Continue to monitor blood pressure at least 1-2 times a week and record results.  Notify provider for elevated blood pressures above 140/90 or less than 90/60.   Notify your provider for signs/ symptoms of bleeding in urine or stool, from gums, excessive bruising  Avoid taking NSAID ( Advil, aleve, ibuprofen ) due to increase bleeding risk with anticoagulants ( blood thinning medications) Continue to monitor your pulse/ oxygen level and record.  Notify your provider for mild symptoms/ call 911 for severe symptoms related to atrial fibrillation:  general fatigue,  rapid heart beat, dizziness, shortness of breath, faintness  Our next appointment is by telephone on 06/05/22 at 11:00 am  Please call the care guide team at [phone] if you need to cancel or reschedule your appointment.   If you are experiencing a Mental Health or [place] or need someone to talk to, please call the Suicide and Crisis Lifeline: 988 call 1-800-273-TALK (toll free, 24 hour hotline)   Patient verbalizes understanding of instructions and care plan provided today and agrees to view in [place]. Active MyChart status and patient understanding of how to access instructions and care plan via MyChart confirmed with patient.     Quinn Plowman RN,BSN,CCM RN Case Manager [place]  [phone]

## 2022-04-30 ENCOUNTER — Telehealth: Payer: Medicare Other

## 2022-05-04 ENCOUNTER — Telehealth: Payer: Medicare Other

## 2022-05-14 DIAGNOSIS — I10 Essential (primary) hypertension: Secondary | ICD-10-CM

## 2022-05-14 DIAGNOSIS — I4891 Unspecified atrial fibrillation: Secondary | ICD-10-CM | POA: Diagnosis not present

## 2022-05-15 ENCOUNTER — Ambulatory Visit (INDEPENDENT_AMBULATORY_CARE_PROVIDER_SITE_OTHER): Payer: Medicare Other

## 2022-05-15 DIAGNOSIS — I442 Atrioventricular block, complete: Secondary | ICD-10-CM | POA: Diagnosis not present

## 2022-05-15 LAB — CUP PACEART REMOTE DEVICE CHECK
Battery Remaining Longevity: 159 mo
Battery Voltage: 3.1 V
Brady Statistic RV Percent Paced: 99.84 %
Date Time Interrogation Session: 20230801051222
Implantable Lead Implant Date: 20221028
Implantable Lead Location: 753860
Implantable Lead Model: 1948
Implantable Pulse Generator Implant Date: 20221028
Lead Channel Impedance Value: 494 Ohm
Lead Channel Impedance Value: 703 Ohm
Lead Channel Pacing Threshold Amplitude: 0.625 V
Lead Channel Pacing Threshold Pulse Width: 0.4 ms
Lead Channel Sensing Intrinsic Amplitude: 11.75 mV
Lead Channel Sensing Intrinsic Amplitude: 11.75 mV
Lead Channel Setting Pacing Amplitude: 2 V
Lead Channel Setting Pacing Pulse Width: 0.4 ms
Lead Channel Setting Sensing Sensitivity: 0.9 mV

## 2022-05-19 ENCOUNTER — Encounter: Payer: Self-pay | Admitting: Family Medicine

## 2022-06-04 ENCOUNTER — Other Ambulatory Visit: Payer: Self-pay | Admitting: Physician Assistant

## 2022-06-05 ENCOUNTER — Telehealth: Payer: Self-pay

## 2022-06-05 ENCOUNTER — Telehealth: Payer: Medicare Other

## 2022-06-05 NOTE — Telephone Encounter (Signed)
  Care Management   Follow Up Note   06/05/2022 Name: Ashlee Warren MRN: [id number] DOB: Mar 26, 1928   Referred by: Tonia Ghent, MD Reason for referral : Care Coordination   An unsuccessful telephone outreach was attempted today. The patient was referred to the case management team for assistance with care management and care coordination.  Unable to reach patient or leave voice message due to phone only ringing.  Attempted x 2.  Telephone call to patients daughter Byanka Landrus. HIPAA compliant voice message left with call back phone number.   Follow Up Plan: The care management team will reach out to the patient again over the next 7 business days.   Quinn Plowman RN,BSN,CCM RN Care Manager Coordinator [phone]

## 2022-06-07 ENCOUNTER — Ambulatory Visit: Payer: Self-pay

## 2022-06-07 NOTE — Chronic Care Management (AMB) (Signed)
Care Management    RN Visit Note  06/07/2022 Name: Ashlee Warren MRN: [id number] DOB: [birth year]  Subjective: Ashlee Warren is a 88 y.o. year old female who is a primary care patient of Tonia Ghent, MD. The care management team was consulted for assistance with disease management and care coordination needs.    Engaged with patient by telephone for follow up visit in response to provider referral for case management and/or care coordination services.   Consent to Services:   Ashlee Warren was given information about Care Management services today including:  Care Management services includes personalized support from designated clinical staff supervised by her physician, including individualized plan of care and coordination with other care providers 24/7 contact phone numbers for assistance for urgent and routine care needs. The patient may stop case management services at any time by phone call to the office staff.  Patient agreed to services and consent obtained.   Assessment: Review of patient past medical history, allergies, medications, health status, including review of consultants reports, laboratory and other test data, was performed as part of comprehensive evaluation and provision of chronic care management services.   SDOH (Social Determinants of Health) assessments and interventions performed:    Care Plan  Allergies  Allergen Reactions   Penicillins Rash   Sulfonamide Derivatives Rash   Tamsulosin Swelling    Swollen Lips    Outpatient Encounter Medications as of 06/07/2022  Medication Sig   acetaminophen (TYLENOL) 325 MG tablet Take 650 mg by mouth every 6 (six) hours as needed for moderate pain or headache.   diltiazem (CARDIZEM CD) 120 MG 24 hr capsule TAKE 1 CAPSULE BY MOUTH EVERY DAY   Multiple Vitamins-Minerals (PRESERVISION AREDS 2+MULTI VIT PO) Take 1 capsule by mouth in the morning and at bedtime.    XARELTO 15 MG TABS tablet TAKE 1 TABLET (15 MG  TOTAL) BY MOUTH DAILY WITH SUPPER.   No facility-administered encounter medications on file as of 06/07/2022.    Patient Active Problem List   Diagnosis Date Noted   Pacemaker 11/14/2021   Memory change 10/22/2021   Bradycardia 07/23/2021   Impacted cerumen of right ear 04/23/2021   Aortic atherosclerosis ([place]) 07/03/2020   Coronary atherosclerosis 07/03/2020   Hematuria 06/29/2020   Medicare annual wellness visit, subsequent 06/08/2019   Bursitis 04/05/2019   Edema 04/05/2019   Right hip pain 06/20/2018   Dysfunction of right eustachian tube 03/25/2018   Zoster 05/07/2017   Advance care planning 10/27/2015   Carotid bruit 06/08/2013   Atrial fibrillation with slow ventricular response ([place]) 09/30/2012   Essential hypertension 09/30/2012    Conditions to be addressed/monitored: Atrial Fibrillation, HTN, and Grief  Care Plan : RN Care Manager Plan of Care  Updates made by Dannielle Karvonen, RN since 06/07/2022 12:00 AM     Problem: Chronic Disease management education and care coordination needs ( A-fib, Hypertension_   Priority: High     Long-Range Goal: Development of plan of care to address crhonic disease management and care coordination needs ( a-fib, hypertension)   Start Date: 08/21/2021  Expected End Date: 06/14/2022  Priority: High  Note:   Current Barriers:  Knowledge Deficits related to plan of care for management of Atrial Fibrillation and HTN Chronic Disease Management support and education needs related to Atrial Fibrillation and HTN Telephone call to patients daughter, [name]. HIPAA verified for patient.  Daughter states patients phone has been down and will be repaired in a few days. She states  patient is doing well from a medical standpoint but still has difficulty with her grief from the loss of her husband.  Daughter inquired if there is a service that can call and check in with patient monthly.   RNCM Clinical Goal(s):  Patient will verbalize basic  understanding of  Atrial Fibrillation and HTN disease process and self health management plan   take all medications exactly as prescribed and will call provider for medication related questions attend all scheduled medical appointments:   continue to work with RN Care Manager to address care management and care coordination needs related to  Atrial Fibrillation and HTN through collaboration with RN Care manager, provider, and care team.  Monitor blood pressure and pulse daily  Interventions: 1:1 collaboration with primary care provider regarding development and update of comprehensive plan of care as evidenced by provider attestation and co-signature Inter-disciplinary care team collaboration (see longitudinal plan of care) Evaluation of current treatment plan related to  self management and patient's adherence to plan as established by provider  Hypertension Interventions: Goal on track:Yes   Long term  Last practice recorded BP readings:  BP Readings from Last 3 Encounters:  11/21/21 112/72  11/14/21 136/70  10/19/21 118/68  Most recent eGFR/CrCl:  Lab Results  Component Value Date   EGFR 50 (L) 05/24/2021    No components found for: "CRCL"  Evaluation of current treatment plan related to hypertension self management and patient's adherence to plan as established by provider Reviewed medications with patient and discussed importance of compliance Discussed plans with patient for ongoing care management follow up and provided patient with direct contact information for care management team; Reviewed scheduled/upcoming provider appointments  Advised to continue to monitor blood pressure at least 1-2 times per week and record.  Advised to follow a low salt diet.  Advised to rise slowly from a sitting or lying position to manage dizziness/ lightheadedness symptoms.   AFIB Interventions: Goal on track:  Yes,  Long term      Reviewed atrial fibrillation signs/ symptoms Reviewed  importance of adherence to anticoagulant exactly as prescribed Counseled on seeking medical attention  contact provider after a fall or if there is blood in the urine/stool.  Advised to continue to monitor pulse/ O2 saturation.    Grief due to loss:  New Goal. Short term RNCM will contact patient once phone repaired to discuss/ offer grief counseling    Patient Goals/Self-Care Activities: Continue to take medications as prescribed and refill timely Attend all scheduled provider appointments Call provider office for new concerns or questions Continue to monitor blood pressure at least 1-2 times a week and record results.  Notify provider for elevated blood pressures above 140/90 or less than 90/60.   Notify your provider for signs/ symptoms of bleeding in urine or stool, from gums, excessive bruising  Avoid taking NSAID ( Advil, aleve, ibuprofen ) due to increase bleeding risk with anticoagulants ( blood thinning medications) Continue to monitor your pulse/ oxygen level and record.  Notify your provider for mild symptoms/ call 911 for severe symptoms related to atrial fibrillation:  general fatigue,  rapid heart beat, dizziness, shortness of breath, faintness Consider referral for grief counseling       Plan: The care management team will reach out to the patient again over the next 1 week .  Quinn Plowman RN,BSN,CCM RN Care Manager Coordinator [phone]

## 2022-06-07 NOTE — Patient Instructions (Signed)
Visit Information  Thank you for taking time to visit with me today. Please don't hesitate to contact me if I can be of assistance to you before our next scheduled telephone appointment.  Following are the goals we discussed today:  Continue to take medications as prescribed and refill timely Attend all scheduled provider appointments Call provider office for new concerns or questions Continue to monitor blood pressure at least 1-2 times a week and record results.  Notify provider for elevated blood pressures above 140/90 or less than 90/60.   Notify your provider for signs/ symptoms of bleeding in urine or stool, from gums, excessive bruising  Avoid taking NSAID ( Advil, aleve, ibuprofen ) due to increase bleeding risk with anticoagulants ( blood thinning medications) Continue to monitor your pulse/ oxygen level and record.  Notify your provider for mild symptoms/ call 911 for severe symptoms related to atrial fibrillation:  general fatigue,  rapid heart beat, dizziness, shortness of breath, faintness Consider referral for grief counseling  Our next appointment is by telephone on 06/11/22 at 11:00 am  Please call the care guide team at [phone] if you need to cancel or reschedule your appointment.   If you are experiencing a Mental Health or [place] or need someone to talk to, please call the Suicide and Crisis Lifeline: 988 call 1-800-273-TALK (toll free, 24 hour hotline)   Patient verbalizes understanding of instructions and care plan provided today and agrees to view in [place]. Active MyChart status and patient understanding of how to access instructions and care plan via MyChart confirmed with patient.      Quinn Plowman RN,BSN,CCM RN Care Manager Coordinator [phone]

## 2022-06-11 ENCOUNTER — Ambulatory Visit: Payer: Self-pay

## 2022-06-11 NOTE — Patient Instructions (Signed)
Visit Information  Thank you for taking time to visit with me today. Please don't hesitate to contact me if I can be of assistance to you.   Following are the goals we discussed today:   Goals Addressed             This Visit's Progress    Patient Stated:  Continue to manage my health conditions and maintanin my health."       Care Coordination Interventions: Evaluation of current treatment plan related to atrial fibrillation and Hypertension and patient's adherence to plan as established by provider Reviewed medications with patient and discussed importance of compliance Reviewed scheduled/upcoming provider appointments i Discussed plans with patient for ongoing care management follow up and provided patient with direct contact information for care management team Advised patient that she needs follow up visit scheduled with cardiologist Called patients daughter Araiya Tilmon to inform her patient needs follow up scheduled with cardiologist.           Our next appointment is by telephone on 07/06/22 at 10:00 am  Please call the care guide team at [phone] if you need to cancel or reschedule your appointment.   If you are experiencing a Mental Health or [place] or need someone to talk to, please call the Suicide and Crisis Lifeline: 988 call 1-800-273-TALK (toll free, 24 hour hotline)  Patient verbalizes understanding of instructions and care plan provided today and agrees to view in [place]. Active MyChart status and patient understanding of how to access instructions and care plan via MyChart confirmed with patient.     Quinn Plowman RN,BSN,CCM RN Care Manager Coordinator [phone]

## 2022-06-11 NOTE — Patient Outreach (Signed)
  Care Coordination   Follow Up Visit Note   06/11/2022 Name: Ashlee Warren MRN: [id number] DOB: 05-31-1928  Ashlee Warren is a 88 y.o. year old female who sees Ashlee Ghent, MD for primary care. I spoke with  Ashlee Warren by phone today.  What matters to the patients health and wellness today?  " Maintaining my health and independence." Patient states today's blood pressure was 108/69.  She states her blood pressures have ranged from 100/60's to 170's 70.  She states her pulse ranges from 60's to 80's.  Patient states she experiences lightheadedness on occasion when turning over in the bed.  She also reports feeling very tired often. Per chart review last cardiology provider visit was 11/21/21.     Goals Addressed             This Visit's Progress    Patient Stated:  Continue to manage my health conditions and maintanin my health."       Care Coordination Interventions: Evaluation of current treatment plan related to atrial fibrillation and Hypertension and patient's adherence to plan as established by provider Reviewed medications with patient and discussed importance of compliance Reviewed scheduled/upcoming provider appointments i Discussed plans with patient for ongoing care management follow up and provided patient with direct contact information for care management team Advised patient that she needs follow up visit scheduled with cardiologist Called patients daughter Ashlee Warren to inform her patient needs follow up scheduled with cardiologist.           SDOH assessments and interventions completed:  Yes  SDOH Interventions Today    Flowsheet Row Most Recent Value  SDOH Interventions   Food Insecurity Interventions Intervention Not Indicated  Housing Interventions Intervention Not Indicated  Transportation Interventions Intervention Not Indicated        Care Coordination Interventions Activated:  Yes  Care Coordination Interventions:  Yes, provided    Follow up plan: Follow up call scheduled for 07/06/22 at 10:00 am    Encounter Outcome:  Pt. Scheduled   Ashlee Plowman RN,BSN,CCM RN Care Manager Coordinator [phone]

## 2022-06-13 NOTE — Progress Notes (Signed)
Remote pacemaker transmission.   

## 2022-07-06 ENCOUNTER — Ambulatory Visit: Payer: Self-pay

## 2022-07-06 NOTE — Patient Outreach (Signed)
  Care Coordination   Follow Up Visit Note   07/06/2022 Name: Ashlee Warren MRN: [id number] DOB: 08/12/1928  Ashlee Warren is a 88 y.o. year old female who sees Tonia Ghent, MD for primary care. I spoke with  Claybon Jabs by phone today.  What matters to the patients health and wellness today?  Patient states her cardiology appointment is scheduled for 08/24/22. She states she continues to monitor her blood pressure and pulse daily.  She reports blood pressures: 115/70, 103/64, 143/62, 160/66 and states her pulse ranges 60's-70's.  Patient states she has occasional shortness of breath.  Denies any symptoms of noticeable rapid heart beat.      Goals Addressed             This Visit's Progress    Patient Stated:  Continue to manage my health conditions and maintanin my health."       Care Coordination Interventions: Evaluation of current treatment plan related to atrial fibrillation and Hypertension and patient's adherence to plan as established by provider Reviewed medications with patient and discussed importance of compliance Reviewed scheduled/upcoming provider appointments  Advised to take recorded blood pressure readings to cardiology appointment on 08/24/22 Continue to monitor blood pressure/ pulse daily and record.  Reviewed signs  / symptoms of atrial fibrillation Advised to report any new or ongoing symptoms to her doctor           SDOH assessments and interventions completed:  No     Care Coordination Interventions Activated:  Yes  Care Coordination Interventions:  Yes, provided   Follow up plan: Follow up call scheduled for 08/27/22 at 10 am    Encounter Outcome:  Pt. Visit Completed   Quinn Plowman RN,BSN,CCM [place] [phone] direct line

## 2022-07-12 DIAGNOSIS — Z23 Encounter for immunization: Secondary | ICD-10-CM | POA: Diagnosis not present

## 2022-08-04 ENCOUNTER — Telehealth: Payer: Self-pay | Admitting: Family Medicine

## 2022-08-06 NOTE — Telephone Encounter (Signed)
Called pt, no answer, line is busy. Couldn't leave vm

## 2022-08-06 NOTE — Telephone Encounter (Signed)
Patient has been scheduled

## 2022-08-06 NOTE — Telephone Encounter (Signed)
Please call and schedule AWV part 2 with Dr. Damita Dunnings after 09/17/22

## 2022-08-14 ENCOUNTER — Ambulatory Visit (INDEPENDENT_AMBULATORY_CARE_PROVIDER_SITE_OTHER): Payer: Medicare Other

## 2022-08-14 DIAGNOSIS — I442 Atrioventricular block, complete: Secondary | ICD-10-CM | POA: Diagnosis not present

## 2022-08-14 LAB — CUP PACEART REMOTE DEVICE CHECK
Battery Remaining Longevity: 154 mo
Battery Voltage: 3.06 V
Brady Statistic RV Percent Paced: 99.79 %
Date Time Interrogation Session: 20231031064019
Implantable Lead Connection Status: 753985
Implantable Lead Implant Date: 20221028
Implantable Lead Location: 753860
Implantable Lead Model: 1948
Implantable Pulse Generator Implant Date: 20221028
Lead Channel Impedance Value: 475 Ohm
Lead Channel Impedance Value: 646 Ohm
Lead Channel Pacing Threshold Amplitude: 0.625 V
Lead Channel Pacing Threshold Pulse Width: 0.4 ms
Lead Channel Sensing Intrinsic Amplitude: 11.125 mV
Lead Channel Sensing Intrinsic Amplitude: 11.125 mV
Lead Channel Setting Pacing Amplitude: 2 V
Lead Channel Setting Pacing Pulse Width: 0.4 ms
Lead Channel Setting Sensing Sensitivity: 0.9 mV
Zone Setting Status: 755011

## 2022-08-15 NOTE — Progress Notes (Signed)
Patient ID: Alyviah Crandle, female   DOB: 1928/09/17, 88 y.o.   MRN: [id number]     88 y.o. chronic afib on Xarelto with no bleeding issues. Had [facility] to NSR 03/11/12 Flecainide stopped due to QRS widening On ETT. No history of CAD. EF has been normal  CHADVASC 4 Significant anxiety ans somatization  Carotid plaque with no stenosis duplex 07/24/17  Has chronic bifasicular block RBBB/LAFB    Had cystoscopy with right ureteral stent 09/16/20 Hematuria resolved Xarelto dose decreased 15 mg   08/10/21 admitted with CHB junctional escape K/Cr ok TSH normal TTE with normal EF Had Medtronic MRI safe PPM placed and cardizem d/c Due to afib had single lead device placed   Post PPM with some postural dizziness Memory and activity level less but this has been since her husband died Rate response turned on 11-28-21   Married 59 years  [name] Husband passed 2021 she is trying to cope  She has 2 daughters in [place] and a son in [place]  Had inguinal hernia surgery with Dr Tamala Julian in [place] Had a great experience   ROS: Denies fever, malais, weight loss, blurry vision, decreased visual acuity, cough, sputum, SOB, hemoptysis, pleuritic pain, palpitaitons, heartburn, abdominal pain, melena, lower extremity edema, claudication, or rash.  All other systems reviewed and negative  General: BP 110/74   Pulse 71   Ht '5\' 2"'$  (1.575 m)   Wt 118 lb 3.2 oz (53.6 kg)   SpO2 98%   BMI 21.62 kg/m  Affect appropriate Healthy:  appears stated age 88: normal Neck supple with no adenopathy JVP normal no bruits no thyromegaly Lungs clear with no wheezing and good diaphragmatic motion Heart:  S1/S2 no murmur, no rub, gallop or click PMI normal Abdomen: benighn, BS positve, no tenderness, no AAA no bruit.  No HSM or HJR left inguinal hernia not bulging  Distal pulses intact with no bruits No edema Neuro non-focal Skin warm and dry No muscular weakness   Current Outpatient Medications  Medication Sig  Dispense Refill   acetaminophen (TYLENOL) 325 MG tablet Take 650 mg by mouth every 6 (six) hours as needed for moderate pain or headache.     diltiazem (CARDIZEM CD) 120 MG 24 hr capsule TAKE 1 CAPSULE BY MOUTH EVERY DAY 90 capsule 3   Multiple Vitamins-Minerals (PRESERVISION AREDS 2+MULTI VIT PO) Take 1 capsule by mouth in the morning and at bedtime.      XARELTO 15 MG TABS tablet TAKE 1 TABLET (15 MG TOTAL) BY MOUTH DAILY WITH SUPPER 90 tablet 1   No current facility-administered medications for this visit.    Allergies  Penicillins, Sulfonamide derivatives, and Tamsulosin   Electrocardiogram: 06/05/18 AFib rate 92 RBBB/LAFB no changes 12/14/19 afib rate 87 RBBB LAFB 08/24/2022 afib RBBB LAFB rate 88 08/24/2022 afib rate 71 LBBB V pacing   Assessment and Plan  PAF:  CHADVASC 4 on xarelto low dose  no bleeding issues  PPM:  Medtronic single lead implanted October 2022 for CHB single lead device with chronic afib Rate response on   HTN:  Well controlled.  Continue current medications and low sodium Dash type diet.   Carotid:  Some plaque no stenosis  duplex 01/25/21  Depression: reactive to husbands death f/u [name]  Urology:   F/U Dr Abner Greenspan post cystoscopy with right ureteral stent no stone seen Held xarelto 2 days before without incident GI:  Right inguinal hernia. Nice surgical experience with Dr Tamala Julian no issues holding DOAC Pain much  improved   F/U in a  6 months with Dr Caryl Comes EP remote PPM checks F/U with me in a year   Jenkins Rouge

## 2022-08-24 ENCOUNTER — Encounter: Payer: Self-pay | Admitting: Cardiovascular Disease

## 2022-08-24 ENCOUNTER — Ambulatory Visit: Payer: Medicare Other | Attending: Cardiovascular Disease | Admitting: Cardiovascular Disease

## 2022-08-24 VITALS — BP 110/74 | HR 71 | Ht 62.0 in | Wt 118.2 lb

## 2022-08-24 DIAGNOSIS — I480 Paroxysmal atrial fibrillation: Secondary | ICD-10-CM

## 2022-08-24 DIAGNOSIS — Z950 Presence of cardiac pacemaker: Secondary | ICD-10-CM | POA: Diagnosis not present

## 2022-08-24 NOTE — Patient Instructions (Addendum)
Medication Instructions:  Your physician recommends that you continue on your current medications as directed. Please refer to the Current Medication list given to you today.  *If you need a refill on your cardiac medications before your next appointment, please call your pharmacy*  Lab Work: If you have labs (blood work) drawn today and your tests are completely normal, you will receive your results only by: [place] (if you have MyChart) OR A paper copy in the mail If you have any lab test that is abnormal or we need to change your treatment, we will call you to review the results.  Testing/Procedures: None ordered today.  Follow-Up: At [facility], you and your health needs are our priority.  As part of our continuing mission to provide you with exceptional heart care, we have created designated Provider Care Teams.  These Care Teams include your primary Cardiologist (physician) and Advanced Practice Providers (APPs -  Physician Assistants and Nurse Practitioners) who all work together to provide you with the care you need, when you need it.  We recommend signing up for the patient portal called "MyChart".  Sign up information is provided on this After Visit Summary.  MyChart is used to connect with patients for Virtual Visits (Telemedicine).  Patients are able to view lab/test results, encounter notes, upcoming appointments, etc.  Non-urgent messages can be sent to your provider as well.   To learn more about what you can do with MyChart, go to [email or web address].    Your next appointment:   12 month(s)  The format for your next appointment:   In Person  Provider:   Jenkins Rouge, MD      Important Information About Sugar

## 2022-08-27 ENCOUNTER — Ambulatory Visit: Payer: Self-pay

## 2022-08-27 NOTE — Patient Outreach (Addendum)
  Care Coordination   Follow Up Visit Note   08/27/2022 Name: Ashlee Warren MRN: [id number] DOB: January 17, 1928  Ashlee Warren is a 88 y.o. year old female who sees Tonia Ghent, MD for primary care. I spoke with  Claybon Jabs by phone today.  What matters to the patients health and wellness today?  Patient states she is doing well.  She reports having follow up with her cardiologist on 08/24/22.  Patient denies any changes to her treatment plan.  She reports having occasional lightheadedness that her doctor is aware of.  She denies dizziness or falls.     Goals Addressed             This Visit's Progress    Patient Stated:  Continue to manage my health conditions and maintanin my health."       Care Coordination Interventions: Evaluation of current treatment plan related to atrial fibrillation and Hypertension and patient's adherence to plan as established by provider Reviewed medications with patient and discussed importance of compliance Reviewed scheduled/upcoming provider appointments  Continue to monitor blood pressure/ pulse daily and record.  Reviewed signs  / symptoms of atrial fibrillation Advised to report any new or ongoing symptoms to her doctor           SDOH assessments and interventions completed:  No     Care Coordination Interventions Activated:  Yes  Care Coordination Interventions:  Yes, provided   Follow up plan: Follow up call scheduled for 10/30/21     Encounter Outcome:  Pt. Visit Completed   Quinn Plowman RN,BSN,CCM [place] [phone] direct line

## 2022-08-28 NOTE — Progress Notes (Signed)
Remote pacemaker transmission.   

## 2022-09-17 ENCOUNTER — Ambulatory Visit (INDEPENDENT_AMBULATORY_CARE_PROVIDER_SITE_OTHER): Payer: Medicare Other | Admitting: *Deleted

## 2022-09-17 DIAGNOSIS — Z0000 Encounter for general adult medical examination without abnormal findings: Secondary | ICD-10-CM | POA: Diagnosis not present

## 2022-09-17 NOTE — Progress Notes (Signed)
Subjective:   Ashlee Warren is a 88 y.o. female who presents for Medicare Annual (Subsequent) preventive examination.  I connected with  Claybon Jabs on 09/17/22 by a telephone enabled telemedicine application and verified that I am speaking with the correct person using two identifiers.   I discussed the limitations of evaluation and management by telemedicine. The patient expressed understanding and agreed to proceed.  Patient location: home  Provider location: Tele-health-home    Review of Systems     Cardiac Risk Factors include: advanced age (>49mn, >>53women)     Objective:    Today's Vitals   There is no height or weight on file to calculate BMI.     09/17/2022    9:13 AM 09/14/2021    9:52 AM 08/21/2021    1:07 PM 09/06/2020    1:22 PM 05/21/2018    8:50 AM 03/04/2017    1:58 PM 03/11/2013   11:45 AM  Advanced Directives  Does Patient Have a Medical Advance Directive? Yes Yes Yes Yes No No Patient does not have advance directive  Type of [place] Living will;Healthcare Power of Attorney     Does patient want to make changes to medical advance directive?   No - Patient declined  No - Patient declined    Copy of [place]in Chart? No - copy requested   No - copy requested     Would patient like information on creating a medical advance directive?     No - Patient declined      Current Medications (verified) Outpatient Encounter Medications as of 09/17/2022  Medication Sig   acetaminophen (TYLENOL) 325 MG tablet Take 650 mg by mouth every 6 (six) hours as needed for moderate pain or headache.   diltiazem (CARDIZEM CD) 120 MG 24 hr capsule TAKE 1 CAPSULE BY MOUTH EVERY DAY   Multiple Vitamins-Minerals (PRESERVISION AREDS 2+MULTI VIT PO) Take 1 capsule by mouth in the morning and at bedtime.    XARELTO 15 MG TABS tablet TAKE 1  TABLET (15 MG TOTAL) BY MOUTH DAILY WITH SUPPER   No facility-administered encounter medications on file as of 09/17/2022.    Allergies (verified) Penicillins, Sulfonamide derivatives, and Tamsulosin   History: Past Medical History:  Diagnosis Date   Atrial fibrillation ([place]    Cataract    Dysrhythmia    afib   Essential hypertension    Past Surgical History:  Procedure Laterality Date   BREAST BIOPSY     left   CARDIOVERSION N/A 03/11/2013   Procedure: CARDIOVERSION;  Surgeon: PJosue Hector MD;  [place] [facility]  Service: Cardiovascular;  Laterality: N/A;   CATARACT EXTRACTION, BILATERAL  2000   CYSTOSCOPY/URETEROSCOPY/HOLMIUM LASER/STENT PLACEMENT Right 09/16/2020   Procedure: CYSTOSCOPY, RETROGRADE PYELOGRAM, URETEROSCOPY, STENT PLACEMENT;  Surgeon: GJanith Lima MD;  Location: [facility];  Service: Urology;  Laterality: Right;  ONLY NEEDS 45 MIN   EYE SURGERY     KIDNEY STONE SURGERY     PACEMAKER IMPLANT N/A 08/11/2021   Procedure: PACEMAKER IMPLANT;  Surgeon: KDeboraha Sprang MD;  [facility];  Service: Cardiovascular;  Laterality: N/A;   TONSILLECTOMY     Family History  Problem Relation Age of Onset   Cancer Mother        leukemia   Cancer Sister    Social History   Socioeconomic History   Marital status: Widowed  Spouse name: Not on file   Number of children: Not on file   Years of education: Not on file   Highest education level: Not on file  Occupational History   Not on file  Tobacco Use   Smoking status: Never   Smokeless tobacco: Never  Vaping Use   Vaping Use: Never used  Substance and Sexual Activity   Alcohol use: No   Drug use: No   Sexual activity: Not Currently    Birth control/protection: None  Other Topics Concern   Not on file  Social History Narrative   Widowed 2021, was married 11/05/47   Retired Therapist, sports, worked as Government social research officer, then worked with [employer], 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     Social  Determinants of Health   Financial Resource Strain: Low Risk  (09/17/2022)   Overall Financial Resource Strain (CARDIA)    Difficulty of Paying Living Expenses: Not hard at all  Food Insecurity: No Food Insecurity (06/11/2022)   Hunger Vital Sign    Worried About Running Out of Food in the Last Year: Never true    Ran Out of Food in the Last Year: Never true  Transportation Needs: No Transportation Needs (06/11/2022)   PRAPARE - Hydrologist (Medical): No    Lack of Transportation (Non-Medical): No  Physical Activity: Inactive (09/17/2022)   Exercise Vital Sign    Days of Exercise per Week: 0 days    Minutes of Exercise per Session: 0 min  Stress: No Stress Concern Present (09/17/2022)   [place]    Feeling of Stress : Not at all  Social Connections: Moderately Isolated (09/17/2022)   Social Connection and Isolation Panel [NHANES]    Frequency of Communication with Friends and Family: Three times a week    Frequency of Social Gatherings with Friends and Family: Three times a week    Attends Religious Services: More than 4 times per year    Active Member of Clubs or Organizations: No    Attends Archivist Meetings: Never    Marital Status: Widowed    Tobacco Counseling Counseling given: Not Answered   Clinical Intake:  Pre-visit preparation completed: Yes  Pain : No/denies pain     Diabetes: No  How often do you need to have someone help you when you read instructions, pamphlets, or other written materials from your doctor or pharmacy?: 1 - Never  Diabetic?  no  Interpreter Needed?: No  Information entered by :: Leroy Kennedy LPN   Activities of Daily Living    09/17/2022    9:14 AM  In your present state of health, do you have any difficulty performing the following activities:  Hearing? 1  Vision? 0  Difficulty concentrating or making decisions? 0  Walking or  climbing stairs? 1  Dressing or bathing? 0  Doing errands, shopping? 1  Preparing Food and eating ? N  Using the Toilet? N  In the past six months, have you accidently leaked urine? N  Do you have problems with loss of bowel control? N  Managing your Medications? N  Managing your Finances? N  Housekeeping or managing your Housekeeping? N    Patient Care Team: Tonia Ghent, MD as PCP - General (Family Medicine) Josue Hector, MD as PCP - Cardiology (Cardiology) Dannielle Karvonen, RN as Case Manager  Indicate any recent Medical Services you may have received from other than  [facility] providers in the past year (date may be approximate).     Assessment:   This is a routine wellness examination for Nalleli.  Hearing/Vision screen Hearing Screening - Comments:: Bilateral hearing aids Vision Screening - Comments:: [facility] Up to date  Dietary issues and exercise activities discussed: Current Exercise Habits: The patient does not participate in regular exercise at present   Goals Addressed             This Visit's Progress    Patient Stated       No goals       Depression Screen    09/17/2022    9:21 AM 09/14/2021    9:56 AM 08/23/2021   12:33 PM 08/21/2021    1:12 PM 06/05/2019    8:30 AM 05/21/2018    8:49 AM 03/04/2017    1:57 PM  PHQ 2/9 Scores  PHQ - 2 Score 1 0 0 0 0 0 0  PHQ- 9 Score 4     0     Fall Risk    09/17/2022    9:13 AM 12/04/2021   10:14 AM 09/14/2021    9:55 AM 08/21/2021    1:12 PM 06/05/2019    8:30 AM  Fall Risk   Falls in the past year? 0 1 0 0 0  Number falls in past yr: 0 0 0 0   Injury with Fall? 0 0 0    Risk for fall due to :   No Fall Risks    Follow up Falls evaluation completed;Education provided;Falls prevention discussed  Falls prevention discussed      FALL RISK PREVENTION PERTAINING TO THE HOME:  Any stairs in or around the home? Yes  If so, are there any without handrails? No  Home free of loose throw rugs in walkways, pet  beds, electrical cords, etc? Yes  Adequate lighting in your home to reduce risk of falls? Yes   ASSISTIVE DEVICES UTILIZED TO PREVENT FALLS:  Life alert? No  Use of a cane, walker or w/c? No  Grab bars in the bathroom? Yes  Shower chair or bench in shower? No  Elevated toilet seat or a handicapped toilet? No   TIMED UP AND GO:  Was the test performed? No .    Cognitive Function:    05/21/2018    8:49 AM 03/04/2017    2:01 PM  MMSE - Mini Mental State Exam  Orientation to time 5 5  Orientation to Place 5 5  Registration 3 3  Attention/ Calculation 0 0  Recall 3 1  Recall-comments  pt was unable to recall 2 of 3 words  Language- name 2 objects 0 0  Language- repeat 1 1  Language- follow 3 step command 3 3  Language- read & follow direction 0 0  Write a sentence 0 0  Copy design 0 0  Total score 20 18        09/17/2022    9:09 AM  6CIT Screen  What Year? 0 points  What month? 0 points  What time? 0 points  Count back from 20 0 points  Months in reverse 0 points  Repeat phrase 2 points  Total Score 2 points    Immunizations Immunization History  Administered Date(s) Administered   Fluad Quad(high Dose 65+) 09/01/2020   Influenza, High Dose Seasonal PF 07/28/2021, 07/12/2022   Influenza,inj,Quad PF,6+ Mos 10/25/2015, 07/19/2016, 07/24/2017, 08/07/2018, 06/05/2019   Influenza-Unspecified 07/28/2021   PFIZER(Purple Top)SARS-COV-2 Vaccination 12/10/2019, 01/05/2020  TDAP status: Due, Education has been provided regarding the importance of this vaccine. Advised may receive this vaccine at local pharmacy or Health Dept. Aware to provide a copy of the vaccination record if obtained from local pharmacy or Health Dept. Verbalized acceptance and understanding.  Flu Vaccine status: Up to date  Pneumococcal vaccine status: Declined,  Education has been provided regarding the importance of this vaccine but patient still declined. Advised may receive this vaccine at local  pharmacy or Health Dept. Aware to provide a copy of the vaccination record if obtained from local pharmacy or Health Dept. Verbalized acceptance and understanding.   Covid-19 vaccine status: Declined, Education has been provided regarding the importance of this vaccine but patient still declined. Advised may receive this vaccine at local pharmacy or Health Dept.or vaccine clinic. Aware to provide a copy of the vaccination record if obtained from local pharmacy or Health Dept. Verbalized acceptance and understanding.  Qualifies for Shingles Vaccine? Yes   Zostavax completed No   Shingrix Completed?: No.    Education has been provided regarding the importance of this vaccine. Patient has been advised to call insurance company to determine out of pocket expense if they have not yet received this vaccine. Advised may also receive vaccine at local pharmacy or Health Dept. Verbalized acceptance and understanding.  Screening Tests Health Maintenance  Topic Date Due   DTaP/Tdap/Td (1 - Tdap) Never done   Zoster Vaccines- Shingrix (1 of 2) Never done   Pneumonia Vaccine 52+ Years old (1 - PCV) Never done   COVID-19 Vaccine (3 - 2023-24 season) 06/15/2022   DEXA SCAN  03/04/2026 (Originally 03/19/1995)   Medicare Annual Wellness (AWV)  09/18/2023   INFLUENZA VACCINE  Completed   HPV VACCINES  Aged Out    Health Maintenance  Health Maintenance Due  Topic Date Due   DTaP/Tdap/Td (1 - Tdap) Never done   Zoster Vaccines- Shingrix (1 of 2) Never done   Pneumonia Vaccine 81+ Years old (1 - PCV) Never done   COVID-19 Vaccine (3 - 2023-24 season) 06/15/2022    Colorectal cancer screening: No longer required.   Mammogram status: No longer required due to age.  Bone Density  postponed  Lung Cancer Screening: (Low Dose CT Chest recommended if Age 6-80 years, 30 pack-year currently smoking OR have quit w/in 15years.) does not qualify.   Lung Cancer Screening Referral:   Additional  Screening:  Hepatitis C Screening: does not qualify  Vision Screening: Recommended annual ophthalmology exams for early detection of glaucoma and other disorders of the eye. Is the patient up to date with their annual eye exam?  Yes  Who is the provider or what is the name of the office in which the patient attends annual eye exams? [facility] If pt is not established with a provider, would they like to be referred to a provider to establish care? No .   Dental Screening: Recommended annual dental exams for proper oral hygiene  Community Resource Referral / Chronic Care Management: CRR required this visit?  No   CCM required this visit?  No      Plan:     I have personally reviewed and noted the following in the patient's chart:   Medical and social history Use of alcohol, tobacco or illicit drugs  Current medications and supplements including opioid prescriptions. Patient is not currently taking opioid prescriptions. Functional ability and status Nutritional status Physical activity Advanced directives List of other physicians Hospitalizations, surgeries, and ER visits in  previous 12 months Vitals Screenings to include cognitive, depression, and falls Referrals and appointments  In addition, I have reviewed and discussed with patient certain preventive protocols, quality metrics, and best practice recommendations. A written personalized care plan for preventive services as well as general preventive health recommendations were provided to patient.     Leroy Kennedy, LPN   10/20/5798   Nurse Notes: patient was a little teary about missing her husband.   Stated she was not depressed she was just sad and lonely.

## 2022-09-17 NOTE — Patient Instructions (Signed)
Ashlee Warren , Thank you for taking time to come for your Medicare Wellness Visit. I appreciate your ongoing commitment to your health goals. Please review the following plan we discussed and let me know if I can assist you in the future.   These are the goals we discussed:  Goals      Increase physical activity     Starting 03/04/17, I will continue to exercise for at least 45 min twice weekly.      Increase physical activity     Starting 05/21/2018, I will continue to exercise for 45 minutes twice weekly and to do yard work as needed.      Patient Stated     Would like to maintain current routine     Patient Stated     No goals     Patient Stated:  Continue to manage my health conditions and maintanin my health."     Care Coordination Interventions: Evaluation of current treatment plan related to atrial fibrillation and Hypertension and patient's adherence to plan as established by provider Reviewed medications with patient and discussed importance of compliance Reviewed scheduled/upcoming provider appointments  Continue to monitor blood pressure/ pulse daily and record.  Reviewed signs  / symptoms of atrial fibrillation Advised to report any new or ongoing symptoms to her doctor           This is a list of the screening recommended for you and due dates:  Health Maintenance  Topic Date Due   DTaP/Tdap/Td vaccine (1 - Tdap) Never done   Zoster (Shingles) Vaccine (1 of 2) Never done   Pneumonia Vaccine (1 - PCV) Never done   COVID-19 Vaccine (3 - 2023-24 season) 06/15/2022   DEXA scan (bone density measurement)  03/04/2026*   Medicare Annual Wellness Visit  09/18/2023   Flu Shot  Completed   HPV Vaccine  Aged Out  *Topic was postponed. The date shown is not the original due date.    Advanced directives: not on file  Conditions/risks identified:   Next appointment: Follow up in one year for your annual wellness visit 09-18-2022 @ 11:30 [facility]   Preventive Care 65  Years and Older, Female Preventive care refers to lifestyle choices and visits with your health care provider that can promote health and wellness. What does preventive care include? A yearly physical exam. This is also called an annual well check. Dental exams once or twice a year. Routine eye exams. Ask your health care provider how often you should have your eyes checked. Personal lifestyle choices, including: Daily care of your teeth and gums. Regular physical activity. Eating a healthy diet. Avoiding tobacco and drug use. Limiting alcohol use. Practicing safe sex. Taking low-dose aspirin every day. Taking vitamin and mineral supplements as recommended by your health care provider. What happens during an annual well check? The services and screenings done by your health care provider during your annual well check will depend on your age, overall health, lifestyle risk factors, and family history of disease. Counseling  Your health care provider may ask you questions about your: Alcohol use. Tobacco use. Drug use. Emotional well-being. Home and relationship well-being. Sexual activity. Eating habits. History of falls. Memory and ability to understand (cognition). Work and work Statistician. Reproductive health. Screening  You may have the following tests or measurements: Height, weight, and BMI. Blood pressure. Lipid and cholesterol levels. These may be checked every 5 years, or more frequently if you are over 83 years old. Skin check. Lung  cancer screening. You may have this screening every year starting at age 71 if you have a 30-pack-year history of smoking and currently smoke or have quit within the past 15 years. Fecal occult blood test (FOBT) of the stool. You may have this test every year starting at age 73. Flexible sigmoidoscopy or colonoscopy. You may have a sigmoidoscopy every 5 years or a colonoscopy every 10 years starting at age 57. Hepatitis C blood  test. Hepatitis B blood test. Sexually transmitted disease (STD) testing. Diabetes screening. This is done by checking your blood sugar (glucose) after you have not eaten for a while (fasting). You may have this done every 1-3 years. Bone density scan. This is done to screen for osteoporosis. You may have this done starting at age 78. Mammogram. This may be done every 1-2 years. Talk to your health care provider about how often you should have regular mammograms. Talk with your health care provider about your test results, treatment options, and if necessary, the need for more tests. Vaccines  Your health care provider may recommend certain vaccines, such as: Influenza vaccine. This is recommended every year. Tetanus, diphtheria, and acellular pertussis (Tdap, Td) vaccine. You may need a Td booster every 10 years. Zoster vaccine. You may need this after age 61. Pneumococcal 13-valent conjugate (PCV13) vaccine. One dose is recommended after age 73. Pneumococcal polysaccharide (PPSV23) vaccine. One dose is recommended after age 72. Talk to your health care provider about which screenings and vaccines you need and how often you need them. This information is not intended to replace advice given to you by your health care provider. Make sure you discuss any questions you have with your health care provider. Document Released: 10/28/2015 Document Revised: 06/20/2016 Document Reviewed: 08/02/2015 Elsevier Interactive Patient Education  2017 [employer] Prevention in the Home Falls can cause injuries. They can happen to people of all ages. There are many things you can do to make your home safe and to help prevent falls. What can I do on the outside of my home? Regularly fix the edges of walkways and driveways and fix any cracks. Remove anything that might make you trip as you walk through a door, such as a raised step or threshold. Trim any bushes or trees on the path to your home. Use  bright outdoor lighting. Clear any walking paths of anything that might make someone trip, such as rocks or tools. Regularly check to see if handrails are loose or broken. Make sure that both sides of any steps have handrails. Any raised decks and porches should have guardrails on the edges. Have any leaves, snow, or ice cleared regularly. Use sand or salt on walking paths during winter. Clean up any spills in your garage right away. This includes oil or grease spills. What can I do in the bathroom? Use night lights. Install grab bars by the toilet and in the tub and shower. Do not use towel bars as grab bars. Use non-skid mats or decals in the tub or shower. If you need to sit down in the shower, use a plastic, non-slip stool. Keep the floor dry. Clean up any water that spills on the floor as soon as it happens. Remove soap buildup in the tub or shower regularly. Attach bath mats securely with double-sided non-slip rug tape. Do not have throw rugs and other things on the floor that can make you trip. What can I do in the bedroom? Use night lights. Make sure that  you have a light by your bed that is easy to reach. Do not use any sheets or blankets that are too big for your bed. They should not hang down onto the floor. Have a firm chair that has side arms. You can use this for support while you get dressed. Do not have throw rugs and other things on the floor that can make you trip. What can I do in the kitchen? Clean up any spills right away. Avoid walking on wet floors. Keep items that you use a lot in easy-to-reach places. If you need to reach something above you, use a strong step stool that has a grab bar. Keep electrical cords out of the way. Do not use floor polish or wax that makes floors slippery. If you must use wax, use non-skid floor wax. Do not have throw rugs and other things on the floor that can make you trip. What can I do with my stairs? Do not leave any items on the  stairs. Make sure that there are handrails on both sides of the stairs and use them. Fix handrails that are broken or loose. Make sure that handrails are as long as the stairways. Check any carpeting to make sure that it is firmly attached to the stairs. Fix any carpet that is loose or worn. Avoid having throw rugs at the top or bottom of the stairs. If you do have throw rugs, attach them to the floor with carpet tape. Make sure that you have a light switch at the top of the stairs and the bottom of the stairs. If you do not have them, ask someone to add them for you. What else can I do to help prevent falls? Wear shoes that: Do not have high heels. Have rubber bottoms. Are comfortable and fit you well. Are closed at the toe. Do not wear sandals. If you use a stepladder: Make sure that it is fully opened. Do not climb a closed stepladder. Make sure that both sides of the stepladder are locked into place. Ask someone to hold it for you, if possible. Clearly mark and make sure that you can see: Any grab bars or handrails. First and last steps. Where the edge of each step is. Use tools that help you move around (mobility aids) if they are needed. These include: Canes. Walkers. Scooters. Crutches. Turn on the lights when you go into a dark area. Replace any light bulbs as soon as they burn out. Set up your furniture so you have a clear path. Avoid moving your furniture around. If any of your floors are uneven, fix them. If there are any pets around you, be aware of where they are. Review your medicines with your doctor. Some medicines can make you feel dizzy. This can increase your chance of falling. Ask your doctor what other things that you can do to help prevent falls. This information is not intended to replace advice given to you by your health care provider. Make sure you discuss any questions you have with your health care provider. Document Released: 07/28/2009 Document Revised:  03/08/2016 Document Reviewed: 11/05/2014 Elsevier Interactive Patient Education  2017 [employer].

## 2022-09-18 ENCOUNTER — Encounter: Payer: Self-pay | Admitting: Family Medicine

## 2022-09-18 ENCOUNTER — Ambulatory Visit (INDEPENDENT_AMBULATORY_CARE_PROVIDER_SITE_OTHER): Payer: Medicare Other | Admitting: Family Medicine

## 2022-09-18 VITALS — BP 134/88 | HR 76 | Temp 98.0°F | Ht 62.0 in | Wt 117.0 lb

## 2022-09-18 DIAGNOSIS — F4321 Adjustment disorder with depressed mood: Secondary | ICD-10-CM

## 2022-09-18 DIAGNOSIS — I4891 Unspecified atrial fibrillation: Secondary | ICD-10-CM

## 2022-09-18 DIAGNOSIS — Z0000 Encounter for general adult medical examination without abnormal findings: Secondary | ICD-10-CM

## 2022-09-18 DIAGNOSIS — Z7189 Other specified counseling: Secondary | ICD-10-CM

## 2022-09-18 DIAGNOSIS — E785 Hyperlipidemia, unspecified: Secondary | ICD-10-CM

## 2022-09-18 LAB — CBC WITH DIFFERENTIAL/PLATELET
Basophils Absolute: 0 10*3/uL (ref 0.0–0.1)
Basophils Relative: 0.4 % (ref 0.0–3.0)
Eosinophils Absolute: 0.1 10*3/uL (ref 0.0–0.7)
Eosinophils Relative: 1.2 % (ref 0.0–5.0)
HCT: 44.2 % (ref 36.0–46.0)
Hemoglobin: 14.8 g/dL (ref 12.0–15.0)
Lymphocytes Relative: 22.2 % (ref 12.0–46.0)
Lymphs Abs: 2.3 10*3/uL (ref 0.7–4.0)
MCHC: 33.4 g/dL (ref 30.0–36.0)
MCV: 87.9 fl (ref 78.0–100.0)
Monocytes Absolute: 1.1 10*3/uL — ABNORMAL HIGH (ref 0.1–1.0)
Monocytes Relative: 10.4 % (ref 3.0–12.0)
Neutro Abs: 6.7 10*3/uL (ref 1.4–7.7)
Neutrophils Relative %: 65.8 % (ref 43.0–77.0)
Platelets: 306 10*3/uL (ref 150.0–400.0)
RBC: 5.03 Mil/uL (ref 3.87–5.11)
RDW: 13.8 % (ref 11.5–15.5)
WBC: 10.2 10*3/uL (ref 4.0–10.5)

## 2022-09-18 LAB — TSH: TSH: 1.72 u[IU]/mL (ref 0.35–5.50)

## 2022-09-18 LAB — LIPID PANEL
Cholesterol: 240 mg/dL — ABNORMAL HIGH (ref 0–200)
HDL: 74.8 mg/dL (ref >39.00)
LDL Cholesterol: 146 mg/dL — ABNORMAL HIGH (ref 0–99)
NonHDL: 164.97
Total CHOL/HDL Ratio: 3
Triglycerides: 97 mg/dL (ref 0.0–149.0)
VLDL: 19.4 mg/dL (ref 0.0–40.0)

## 2022-09-18 LAB — COMPREHENSIVE METABOLIC PANEL
ALT: 15 U/L (ref 0–35)
AST: 18 U/L (ref 0–37)
Albumin: 4.1 g/dL (ref 3.5–5.2)
Alkaline Phosphatase: 100 U/L (ref 39–117)
BUN: 28 mg/dL — ABNORMAL HIGH (ref 6–23)
CO2: 31 mEq/L (ref 19–32)
Calcium: 9.6 mg/dL (ref 8.4–10.5)
Chloride: 103 mEq/L (ref 96–112)
Creatinine, Ser: 1.11 mg/dL (ref 0.40–1.20)
GFR: 43.15 mL/min — ABNORMAL LOW (ref >60.00)
Glucose, Bld: 89 mg/dL (ref 70–99)
Potassium: 4.3 mEq/L (ref 3.5–5.1)
Sodium: 142 mEq/L (ref 135–145)
Total Bilirubin: 0.6 mg/dL (ref 0.2–1.2)
Total Protein: 6.9 g/dL (ref 6.0–8.3)

## 2022-09-18 NOTE — Patient Instructions (Signed)
Go to the lab on the way out.   If you have mychart we'll likely use that to update you.    Take care.  Glad to see you. 

## 2022-09-18 NOTE — Progress Notes (Unsigned)
She had her hernia repaired.  She feels better about that.    AF.  On xarelto. No bleeding.  No CP.  Had cards f/u.  S/p pacer placement. No syncope.  No falls.   Declined DXA and mammogram at this point.  D/w pt.   Colon screening not due.    She wouldn't wasn't hospice care at any point, per her report today.   Children designated if patient were incapacitated.   She still misses her husband, discussed.  She is going to update me as needed, per patient she isn't at the point of needing treatment.   Her ex daughter in law was recently killed in a MVA, condolences offered.     Meds, vitals, and allergies reviewed.   ROS: Per HPI unless specifically indicated in ROS section    rrr

## 2022-09-20 DIAGNOSIS — F4321 Adjustment disorder with depressed mood: Secondary | ICD-10-CM | POA: Insufficient documentation

## 2022-09-20 NOTE — Assessment & Plan Note (Signed)
On xarelto. No bleeding.  No CP.  Had cards f/u.  S/p pacer placement. No syncope.  No falls.  See notes on labs.

## 2022-09-20 NOTE — Assessment & Plan Note (Signed)
Grief discussed with patient.  She still misses her husband, discussed.  She is going to update me as needed, per patient she isn't at the point of needing treatment.

## 2022-09-20 NOTE — Assessment & Plan Note (Signed)
Declined DXA and mammogram at this point.  D/w pt.   Colon screening not due given her age.

## 2022-09-20 NOTE — Assessment & Plan Note (Signed)
She wouldn't wasn't hospice care at any point, per her report today.   Children designated if patient were incapacitated.

## 2022-09-24 ENCOUNTER — Encounter: Payer: Self-pay | Admitting: Family Medicine

## 2022-10-30 ENCOUNTER — Ambulatory Visit: Payer: Self-pay

## 2022-10-30 DIAGNOSIS — D225 Melanocytic nevi of trunk: Secondary | ICD-10-CM | POA: Diagnosis not present

## 2022-10-30 DIAGNOSIS — X32XXXD Exposure to sunlight, subsequent encounter: Secondary | ICD-10-CM | POA: Diagnosis not present

## 2022-10-30 DIAGNOSIS — B078 Other viral warts: Secondary | ICD-10-CM | POA: Diagnosis not present

## 2022-10-30 DIAGNOSIS — L570 Actinic keratosis: Secondary | ICD-10-CM | POA: Diagnosis not present

## 2022-10-30 NOTE — Patient Outreach (Signed)
  Care Coordination   Follow Up Visit Note   10/30/2022 Name: Ashlee Warren MRN: [id number] DOB: Jan 11, 1928  Ashlee Warren is a 89 y.o. year old female who sees Tonia Ghent, MD for primary care. I spoke with  Claybon Jabs by phone today.  What matters to the patients health and wellness today?  Continuing to stay healthy    Goals Addressed             This Visit's Progress    Patient Stated:  Continue to manage my health conditions and maintanin my health."       Care Coordination Interventions: Evaluation of current treatment plan related to atrial fibrillation and Hypertension and patient's adherence to plan as established by provider:  Patient reports having follow up visit with cardiologist in December 2023.  She denies any change in treatment plan/ medications. Patient states she feels weaker and contributes this to her age.   Reports having a dermatology visit this afternoon and states her daughter is taking her.  Reviewed medications with patient and discussed importance of compliance Reviewed scheduled/upcoming provider appointments  Continue to monitor blood pressure/ pulse daily and record.  Assessed for signs  / symptoms of atrial fibrillation Advised to report any new or ongoing symptoms to her doctor           SDOH assessments and interventions completed:  No     Care Coordination Interventions:  Yes, provided   Follow up plan: Follow up call scheduled for 12/25/22    Encounter Outcome:  Pt. Visit Completed   Quinn Plowman RN,BSN,CCM [place] [phone] direct line

## 2022-11-13 ENCOUNTER — Ambulatory Visit: Payer: Medicare Other

## 2022-11-13 DIAGNOSIS — I442 Atrioventricular block, complete: Secondary | ICD-10-CM

## 2022-11-13 LAB — CUP PACEART REMOTE DEVICE CHECK
Battery Remaining Longevity: 150 mo
Battery Voltage: 3.04 V
Brady Statistic RV Percent Paced: 99.77 %
Date Time Interrogation Session: 20240130061028
Implantable Lead Connection Status: 753985
Implantable Lead Implant Date: 20221028
Implantable Lead Location: 753860
Implantable Lead Model: 1948
Implantable Pulse Generator Implant Date: 20221028
Lead Channel Impedance Value: 456 Ohm
Lead Channel Impedance Value: 646 Ohm
Lead Channel Pacing Threshold Amplitude: 0.625 V
Lead Channel Pacing Threshold Pulse Width: 0.4 ms
Lead Channel Sensing Intrinsic Amplitude: 11 mV
Lead Channel Sensing Intrinsic Amplitude: 11 mV
Lead Channel Setting Pacing Amplitude: 2 V
Lead Channel Setting Pacing Pulse Width: 0.4 ms
Lead Channel Setting Sensing Sensitivity: 0.9 mV
Zone Setting Status: 755011

## 2022-11-22 ENCOUNTER — Ambulatory Visit (INDEPENDENT_AMBULATORY_CARE_PROVIDER_SITE_OTHER): Payer: Medicare Other | Admitting: Family Medicine

## 2022-11-22 ENCOUNTER — Encounter: Payer: Self-pay | Admitting: Family Medicine

## 2022-11-22 VITALS — BP 126/72 | HR 75 | Temp 97.8°F | Ht 62.0 in | Wt 121.0 lb

## 2022-11-22 DIAGNOSIS — M7050 Other bursitis of knee, unspecified knee: Secondary | ICD-10-CM | POA: Diagnosis not present

## 2022-11-22 MED ORDER — DICLOFENAC SODIUM 1 % EX GEL: 2.0000 g | Freq: Four times a day (QID) | CUTANEOUS | 3 refills | Status: DC | PRN

## 2022-11-22 MED ORDER — ACETAMINOPHEN 500 MG PO TABS: 500.0000 mg | ORAL_TABLET | Freq: Three times a day (TID) | ORAL | Status: AC | PRN

## 2022-11-22 NOTE — Progress Notes (Signed)
L knee pain.  Going on for about 2 weeks.  Swelling occ now.  Taking tylenol occasionally, i.e. minimal Tylenol use.  Prev bumped knee on the stairs when going up the stairs.  Didn't fall.  Has has been able to bear weight.  Has used voltaren gel.    Dreams d/w pt.  Distressing to patient.  She is trying to manage those as is.  She still trying to work through the loss of her husband, as expected.  Family supportive.  Meds, vitals, and allergies reviewed.   ROS: Per HPI unless specifically indicated in ROS section   Nad Ncat Neck supple, no LA Rrr Ctab L knee puffy anteriorly w/o bruising.  No crepitus on range of motion.  Medial and lateral joint line not tender.  Able to bear weight.

## 2022-11-22 NOTE — Patient Instructions (Addendum)
Voltaren gel if needed.  Ice for 5 minutes at a time, multiple times per day.  Tylenol as needed, up to 2 pills 3 times a day.  Okay to try a compression sleeve.    Likely bursitis.   Take care.  Glad to see you. Update me as needed.

## 2022-11-25 DIAGNOSIS — M7050 Other bursitis of knee, unspecified knee: Secondary | ICD-10-CM | POA: Insufficient documentation

## 2022-11-25 NOTE — Assessment & Plan Note (Signed)
Likely bursitis.  Voltaren gel if needed.  Ice for 5 minutes at a time, multiple times per day.  Tylenol as needed, up to 2 pills 3 times a day.  Okay to try a compression sleeve.   Update me as needed.   Anatomy discussed with patient.  Okay to defer imaging.

## 2022-12-07 NOTE — Progress Notes (Signed)
Remote pacemaker transmission.   

## 2022-12-25 ENCOUNTER — Ambulatory Visit: Payer: Self-pay

## 2022-12-25 NOTE — Patient Outreach (Signed)
  Care Coordination   Follow Up Visit Note   12/25/2022 Name: Ashlee Warren MRN: [id number] DOB: 07-Mar-1928  Ashlee Warren is a 89 y.o. year old female who sees Tonia Ghent, MD for primary care. I spoke with  Claybon Jabs by phone today.  What matters to the patients health and wellness today?  Patient states she is doing well.  Patient reminded of primary care visit in February 2024 regarding left knee patient.  Patient states she bumped her knee on the stair railing.  She states her knee is much better now and denies any pain.  Patient denies any atrial fibrillation symptoms. She states she continues to monitor her oxygen level and pulse.  Patient reports her oxygen level ranges in the 90's and her pulse ranges in the 70's.  Confirmed her next follow up visit with cardiology, Dr. Caryl Comes is in May 2024.  Patient reports sustaining a fall approximately 2-3 weeks ago when outside bringing in her trash can.  She reports sustaining a small scratch on her arm. She states she applied ice and pressure and area has healed.    Goals Addressed             This Visit's Progress    Patient Stated:  Continue to manage my health conditions and maintanin my health."       Interventions Today    Flowsheet Row Most Recent Value  Chronic Disease   Chronic disease during today's visit Atrial Fibrillation (AFib)  [Evaluation of current treatment plan related to atrial fibrillation and patients adherence to plan as established by provider.]  General Interventions   General Interventions Discussed/Reviewed General Interventions Reviewed, Doctor Visits  [verbally assessed for atrial fibrillation symptom.]  Doctor Visits Discussed/Reviewed Doctor Visits Reviewed  [reviewed scheduled/ upcoming provider visits.]  Exercise Interventions   Exercise Discussed/Reviewed Physical Activity  [Discussed need for ongoing physical activity.  Discussed interest such as gardening]  Physical Activity  Discussed/Reviewed Physical Activity Reviewed  Education Interventions   Education Provided Provided Education  [Reviewed signs/ symptoms of atrial fibrillations]  Provided Verbal Education On Other  [Reviewed atrial fibrillation symptoms.]  Pharmacy Interventions   Pharmacy Dicussed/Reviewed Pharmacy Topics Reviewed  [medications reviewed and compliance discussed.  Discussed xeralto and falls.  Discussed importance of letting doctor know if head is hit during a fall.]  Safety Interventions   Safety Discussed/Reviewed Safety Reviewed  [Discussed fall prevention]                SDOH assessments and interventions completed:  No     Care Coordination Interventions:  Yes, provided   Follow up plan: Follow up call scheduled for 03/07/23    Encounter Outcome:  Pt. Visit Completed   Quinn Plowman RN,BSN,CCM [place] [phone] direct line

## 2023-01-24 ENCOUNTER — Other Ambulatory Visit: Payer: Self-pay | Admitting: Family Medicine

## 2023-02-12 ENCOUNTER — Ambulatory Visit (INDEPENDENT_AMBULATORY_CARE_PROVIDER_SITE_OTHER): Payer: Medicare Other

## 2023-02-12 DIAGNOSIS — I442 Atrioventricular block, complete: Secondary | ICD-10-CM

## 2023-02-13 LAB — CUP PACEART REMOTE DEVICE CHECK
Battery Remaining Longevity: 147 mo
Battery Voltage: 3.03 V
Brady Statistic RV Percent Paced: 99.43 %
Date Time Interrogation Session: 20240430070339
Implantable Lead Connection Status: 753985
Implantable Lead Implant Date: 20221028
Implantable Lead Location: 753860
Implantable Lead Model: 1948
Implantable Pulse Generator Implant Date: 20221028
Lead Channel Impedance Value: 456 Ohm
Lead Channel Impedance Value: 646 Ohm
Lead Channel Pacing Threshold Amplitude: 0.625 V
Lead Channel Pacing Threshold Pulse Width: 0.4 ms
Lead Channel Sensing Intrinsic Amplitude: 10.875 mV
Lead Channel Sensing Intrinsic Amplitude: 10.875 mV
Lead Channel Setting Pacing Amplitude: 2 V
Lead Channel Setting Pacing Pulse Width: 0.4 ms
Lead Channel Setting Sensing Sensitivity: 0.9 mV
Zone Setting Status: 755011

## 2023-02-19 DIAGNOSIS — I4821 Permanent atrial fibrillation: Secondary | ICD-10-CM | POA: Insufficient documentation

## 2023-02-22 ENCOUNTER — Encounter: Payer: Self-pay | Admitting: Internal Medicine

## 2023-02-22 ENCOUNTER — Ambulatory Visit: Payer: Medicare Other | Attending: Internal Medicine | Admitting: Internal Medicine

## 2023-02-22 VITALS — BP 138/78 | HR 73 | Ht 62.0 in | Wt 121.2 lb

## 2023-02-22 DIAGNOSIS — I4821 Permanent atrial fibrillation: Secondary | ICD-10-CM

## 2023-02-22 DIAGNOSIS — I442 Atrioventricular block, complete: Secondary | ICD-10-CM

## 2023-02-22 DIAGNOSIS — Z950 Presence of cardiac pacemaker: Secondary | ICD-10-CM | POA: Insufficient documentation

## 2023-02-22 NOTE — Progress Notes (Signed)
Patient Care Team: Joaquim Nam, MD as PCP - General (Family Medicine) Wendall Stade, MD as PCP - Cardiology (Cardiology) Otho Ket, RN as Case Manager   HPI  Ashlee Warren is a 89 y.o. female seen in follow-up for pacemaker Medtronic left bundle branch area implanted 10/22 for complete heart block in the setting of permanent atrial fibrillation.  Anticoagulation is with Xarelto.  No bleeding   The patient denies chest pain, shortness of breath, nocturnal dyspnea, orthopnea or peripheral edema.  There have been no palpitations, lightheadedness or syncope.     10/22 echocardiogram EF normal  Date Cr K Hgb  10/22 1.04 4.0 12.7   12/23 1.11 4.3 14.8   Records and Results Reviewed   Past Medical History:  Diagnosis Date   Atrial fibrillation (HCC)    Cataract    Dysrhythmia    afib   Essential hypertension     Past Surgical History:  Procedure Laterality Date   BREAST BIOPSY     left   CARDIOVERSION N/A 03/11/2013   Procedure: CARDIOVERSION;  Surgeon: Wendall Stade, MD;  Location: [facility] ENDOSCOPY;  Service: Cardiovascular;  Laterality: N/A;   CATARACT EXTRACTION, BILATERAL  2000   CYSTOSCOPY/URETEROSCOPY/HOLMIUM LASER/STENT PLACEMENT Right 09/16/2020   Procedure: CYSTOSCOPY, RETROGRADE PYELOGRAM, URETEROSCOPY, STENT PLACEMENT;  Surgeon: Jannifer Hick, MD;  Location: [facility] ORS;  Service: Urology;  Laterality: Right;  ONLY NEEDS 45 MIN   EYE SURGERY     HERNIA REPAIR     KIDNEY STONE SURGERY     PACEMAKER IMPLANT N/A 08/11/2021   Procedure: PACEMAKER IMPLANT;  Surgeon: Duke Salvia, MD;  Location: [facility]VASIVE CV LAB;  Service: Cardiovascular;  Laterality: N/A;   TONSILLECTOMY      Current Meds  Medication Sig   acetaminophen (TYLENOL) 500 MG tablet Take 1-2 tablets (500-1,000 mg total) by mouth every 8 (eight) hours as needed.   diclofenac Sodium (VOLTAREN) 1 % GEL Apply 2 g topically 4 (four) times daily as needed.   diltiazem (CARDIZEM CD) 120 MG  24 hr capsule TAKE 1 CAPSULE BY MOUTH EVERY DAY   Multiple Vitamins-Minerals (PRESERVISION AREDS 2+MULTI VIT PO) Take 1 capsule by mouth in the morning and at bedtime.    XARELTO 15 MG TABS tablet TAKE 1 TABLET (15 MG TOTAL) BY MOUTH DAILY WITH SUPPER    Allergies  Allergen Reactions   Penicillins Rash   Sulfonamide Derivatives Rash   Tamsulosin Swelling    Swollen Lips    ROS:  Please see the history of present illness. (+) Pain at the surgical site  (+) Vertigo All other systems are reviewed and negative.   Review of Systems negative except from HPI and PMH  Physical Exam BP 138/78   Pulse 73   Ht 5\' 2"  (1.575 m)   Wt 121 lb 3.2 oz (55 kg)   SpO2 98%   BMI 22.17 kg/m  Well developed and nourished in no acute distress HENT normal Neck supple with JVP-  flat   Clear Regular rate and rhythm, no murmurs or gallops Abd-soft with active BS No Clubbing cyanosis edema Skin-warm and dry A & Oriented  Grossly normal sensory and motor function  ECG  Atrial fibrillation with ventricular pacing and intermittent conduction at 73  CrCl cannot be calculated (Patient's most recent lab result is older than the maximum 21 days allowed.).   Assessment and  Plan Atrial fibrillation-permanent  Complete heart block  Pacing-Medtronic left bundle branch  area  Hypertension  Grieving  Overall doing reasonably.  No bleeding.  Continue rivaroxaban.  Heart rates are relatively blunted we will continue the Cardizem of 120 daily for blood pressure as well  Can consider stopping the Cardizem.  Long discussion about the loss of her husband

## 2023-02-22 NOTE — Patient Instructions (Signed)
Medication Instructions:  Your physician recommends that you continue on your current medications as directed. Please refer to the Current Medication list given to you today.  *If you need a refill on your cardiac medications before your next appointment, please call your pharmacy*   Lab Work: None ordered.  If you have labs (blood work) drawn today and your tests are completely normal, you will receive your results only by: MyChart Message (if you have MyChart) OR A paper copy in the mail If you have any lab test that is abnormal or we need to change your treatment, we will call you to review the results.   Testing/Procedures: None ordered.    Follow-Up: At [facility], you and your health needs are our priority.  As part of our continuing mission to provide you with exceptional heart care, we have created designated Provider Care Teams.  These Care Teams include your primary Cardiologist (physician) and Advanced Practice Providers (APPs -  Physician Assistants and Nurse Practitioners) who all work together to provide you with the care you need, when you need it.  We recommend signing up for the patient portal called "MyChart".  Sign up information is provided on this After Visit Summary.  MyChart is used to connect with patients for Virtual Visits (Telemedicine).  Patients are able to view lab/test results, encounter notes, upcoming appointments, etc.  Non-urgent messages can be sent to your provider as well.   To learn more about what you can do with MyChart, go to [email or web address].    Your next appointment:   12 months with Dr Klein 

## 2023-03-06 NOTE — Progress Notes (Signed)
Remote pacemaker transmission.   

## 2023-03-07 ENCOUNTER — Ambulatory Visit: Payer: Self-pay

## 2023-03-07 DIAGNOSIS — Z139 Encounter for screening, unspecified: Secondary | ICD-10-CM

## 2023-03-07 NOTE — Patient Instructions (Signed)
Visit Information  Thank you for taking time to visit with me today. Please don't hesitate to contact me if I can be of assistance to you.   Following are the goals we discussed today:  Monitor your oxygen level and pulse 2-3 times per week or when short of breath.  Notify provider for concerns.  Take medications as prescribed. Keep follow up appointments with providers. Expect to receive call from community resource guide regarding a list of in home care providers.   Our next appointment is by telephone on 05/08/23 at 10 am  Please call the care guide team at [phone] if you need to cancel or reschedule your appointment.   If you are experiencing a Mental Health or Behavioral Health Crisis or need someone to talk to, please call the Suicide and Crisis Lifeline: 988 call 1-800-273-TALK (toll free, 24 hour hotline)  Patient verbalizes understanding of instructions and care plan provided today and agrees to view in MyChart. Active MyChart status and patient understanding of how to access instructions and care plan via MyChart confirmed with patient.     George Ina RN,BSN,CCM [facility] Care Coordination [phone] direct line

## 2023-03-07 NOTE — Patient Outreach (Signed)
  Care Coordination   Follow Up Visit Note   03/07/2023 Name: Ashlee Warren MRN: [id number] DOB: [birth year]  Ashlee Warren is a 89 y.o. year old female who sees Joaquim Nam, MD for primary care. I spoke with  Ashlee Warren by phone today.  What matters to the patients health and wellness today?  Patient denies any new or ongoing symptoms. She reports having follow up with cardiologist on 02/22/23.  Denies any treatment change.  Patient states she doesn't check her oxygen level or pulse like she use to .  Patient states she is interested in getting information regarding in home care assistance.     Goals Addressed             This Visit's Progress    Patient Stated:  Continue to manage my health conditions and maintanin my health."       Interventions Today    Flowsheet Row Most Recent Value  Chronic Disease   Chronic disease during today's visit Atrial Fibrillation (AFib)  General Interventions   General Interventions Discussed/Reviewed General Interventions Reviewed, Doctor Visits, [employer] of current treatment plan for Atrial fibrillation and patients adherence to plan as established by provider.  Assess for oxygen level/ pulse readings. Assesed for A-fib. Referral to resource guide for in home care providers]  Doctor Visits Discussed/Reviewed Doctor Visits Reviewed  Ashlee Warren upcoming provider visits]  Education Interventions   Education Provided Provided Education  [Advised to continue to check O2 sat when feeling SOB and check pulse periodically. Reviewed signs of bleeding due to xeralto and advised not notify provider.]  Pharmacy Interventions   Pharmacy Dicussed/Reviewed Pharmacy Topics Reviewed  [medications reviewed and compliance discussed.]  Safety Interventions   Safety Discussed/Reviewed Fall Risk  [assessed for falls. Re-discussed fall safety / prevention]                SDOH assessments and interventions completed:  No      Care Coordination Interventions:  Yes, provided   Follow up plan: Follow up call scheduled for 05/08/23    Encounter Outcome:  Pt. Visit Completed   George Ina RN,BSN,CCM [facility] Care Coordination [phone] direct line

## 2023-03-08 ENCOUNTER — Telehealth: Payer: Self-pay | Admitting: *Deleted

## 2023-03-08 NOTE — Progress Notes (Signed)
  Care Coordination   Note   03/08/2023 Name: [name] MRN: [id number] DOB: 02-05-1928  Ashlee Warren is a 89 y.o. year old female who sees Joaquim Nam, MD for primary care. I reached out to Hershal Coria by phone today to offer care coordination services.  [name] was given information about Care Coordination services today including:   The Care Coordination services include support from the care team which includes your Nurse Coordinator, Clinical Social Worker, or Pharmacist.  The Care Coordination team is here to help remove barriers to the health concerns and goals most important to you. Care Coordination services are voluntary, and the patient may decline or stop services at any time by request to their care team member.   Care Coordination Consent Status: Patient agreed to services and verbal consent obtained.   Follow up plan:  Telephone appointment with care coordination team member scheduled for:  03/13/2023   Encounter Outcome:  Pt. Scheduled from referral   Burman Nieves, [facility] Care Coordination Care Guide Direct Dial: [phone]

## 2023-03-13 ENCOUNTER — Ambulatory Visit: Payer: Self-pay

## 2023-03-13 NOTE — Patient Outreach (Signed)
  Care Coordination   Initial Visit Note   03/13/2023 Name: Ashlee Warren MRN: [id number] DOB: 1928/01/16  Ashlee Warren is a 89 y.o. year old female who sees Joaquim Nam, MD for primary care. I spoke with  Hershal Coria by phone today.  What matters to the patients health and wellness today?  Patient request to reschedule appointment as she has family working in the yard for the next few days.  SW attempted to reschedule but patient requested to call SW back when she could reschedule for June.    Goals Addressed   None     SDOH assessments and interventions completed:  No     Care Coordination Interventions:  No, not indicated   Follow up plan:  Patient to call when ready to schedule.    Encounter Outcome:  Pt. Visit Completed

## 2023-03-22 ENCOUNTER — Ambulatory Visit: Payer: Self-pay

## 2023-03-22 NOTE — Patient Outreach (Signed)
  Care Coordination   Initial Visit Note   03/22/2023 Name: Nazly Digilio MRN: [id number] DOB: November 19, 1927  Meghan Warshawsky is a 89 y.o. year old female who sees Joaquim Nam, MD for primary care. I spoke with  Hershal Coria daughter Nicolasa Milbrath by phone today.  What matters to the patients health and wellness today?  Patients daughter is requesting a list of home care providers for companionship and light housekeeping.  Patient can pay out of pocket costs.  Patient does well with cleaning independently, but needs extra help. Patients church helps with yard work and some socialization.  Daughter agreed to contact providers given by SW.    Goals Addressed             This Visit's Progress    Companionship and cleaning services       -Patients daughter request someone to visit and be available in the home for a few hours per day -Patients daughter request light cleaning service         SDOH assessments and interventions completed:  Yes    Interventions Today    Flowsheet Row Most Recent Value  Chronic Disease   Chronic disease during today's visit Hypertension (HTN)  General Interventions   General Interventions Discussed/Reviewed --  [Request cleaning services and companionship. Provided a home care companies and [employer] [facility] [place].  Daughter will contact and reports patient can pay out of pocket.]        Care Coordination Interventions:  Yes, provided   Care Coordination Interventions:  SW provided a few providers in the [place] area for home care and also [employer] for other services.   Follow up plan: No further intervention required.   Encounter Outcome:  Pt. Visit Completed

## 2023-03-22 NOTE — Patient Instructions (Signed)
Visit Information  Thank you for taking time to visit with me today. Please don't hesitate to contact me if I can be of assistance to you.   Following are the goals we discussed today:   Goals Addressed             This Visit's Progress    Companionship and cleaning services       -Patients daughter request someone to visit and be available in the home for a few hours per day -Patients daughter request light cleaning service         If you are experiencing a Mental Health or Behavioral Health Crisis or need someone to talk to, please call 911  Patient verbalizes understanding of instructions and care plan provided today and agrees to view in MyChart. Active MyChart status and patient understanding of how to access instructions and care plan via MyChart confirmed with patient.     No further follow up required:    Lysle Morales, BSW Social Worker [facility] Care Management  [phone]

## 2023-04-30 ENCOUNTER — Encounter: Payer: Self-pay | Admitting: Family Medicine

## 2023-04-30 ENCOUNTER — Ambulatory Visit: Payer: Medicare Other | Admitting: Family Medicine

## 2023-04-30 VITALS — BP 116/72 | HR 73 | Temp 97.6°F | Ht 62.0 in | Wt 121.0 lb

## 2023-04-30 DIAGNOSIS — I4891 Unspecified atrial fibrillation: Secondary | ICD-10-CM

## 2023-04-30 DIAGNOSIS — I10 Essential (primary) hypertension: Secondary | ICD-10-CM | POA: Diagnosis not present

## 2023-04-30 DIAGNOSIS — R42 Dizziness and giddiness: Secondary | ICD-10-CM | POA: Diagnosis not present

## 2023-04-30 LAB — COMPREHENSIVE METABOLIC PANEL
ALT: 17 U/L (ref 0–35)
AST: 20 U/L (ref 0–37)
Albumin: 4 g/dL (ref 3.5–5.2)
Alkaline Phosphatase: 104 U/L (ref 39–117)
BUN: 27 mg/dL — ABNORMAL HIGH (ref 6–23)
CO2: 31 mEq/L (ref 19–32)
Calcium: 9.7 mg/dL (ref 8.4–10.5)
Chloride: 103 mEq/L (ref 96–112)
Creatinine, Ser: 0.99 mg/dL (ref 0.40–1.20)
GFR: 49.29 mL/min — ABNORMAL LOW (ref >60.00)
Glucose, Bld: 90 mg/dL (ref 70–99)
Potassium: 4.3 mEq/L (ref 3.5–5.1)
Sodium: 140 mEq/L (ref 135–145)
Total Bilirubin: 0.6 mg/dL (ref 0.2–1.2)
Total Protein: 6.6 g/dL (ref 6.0–8.3)

## 2023-04-30 LAB — CBC WITH DIFFERENTIAL/PLATELET
Basophils Absolute: 0.1 10*3/uL (ref 0.0–0.1)
Basophils Relative: 0.6 % (ref 0.0–3.0)
Eosinophils Absolute: 0.2 10*3/uL (ref 0.0–0.7)
Eosinophils Relative: 1.9 % (ref 0.0–5.0)
HCT: 42.4 % (ref 36.0–46.0)
Hemoglobin: 13.6 g/dL (ref 12.0–15.0)
Lymphocytes Relative: 24.3 % (ref 12.0–46.0)
Lymphs Abs: 2.2 10*3/uL (ref 0.7–4.0)
MCHC: 32.1 g/dL (ref 30.0–36.0)
MCV: 89.4 fl (ref 78.0–100.0)
Monocytes Absolute: 1 10*3/uL (ref 0.1–1.0)
Monocytes Relative: 10.9 % (ref 3.0–12.0)
Neutro Abs: 5.5 10*3/uL (ref 1.4–7.7)
Neutrophils Relative %: 62.3 % (ref 43.0–77.0)
Platelets: 285 10*3/uL (ref 150.0–400.0)
RBC: 4.74 Mil/uL (ref 3.87–5.11)
RDW: 13.8 % (ref 11.5–15.5)
WBC: 8.9 10*3/uL (ref 4.0–10.5)

## 2023-04-30 LAB — TSH: TSH: 1.45 u[IU]/mL (ref 0.35–5.50)

## 2023-04-30 NOTE — Patient Instructions (Signed)
Go to the lab on the way out.   If you have mychart we'll likely use that to update you.    Take care.  Glad to see you. I'll update cardiology and we'll go from there.

## 2023-04-30 NOTE — Progress Notes (Unsigned)
She is going to be working at [employer].  "Dizzy" sensation.  Short duration.  Noted after standing.  Room doesn't spin.  She is slow to get out of bed, to limit symptoms.  She has to pause after standing.  She has checked her pulse with occ elevated rates but still regular (ie not skipped beats).  No syncope.    Meds, vitals, and allergies reviewed.   ROS: Per HPI unless specifically indicated in ROS section       Check labs today and update Dr. Graciela Husbands about potentially lowing diltiazem.

## 2023-05-01 NOTE — Assessment & Plan Note (Signed)
Check labs today and update Dr. Graciela Husbands about potentially lowing diltiazem.

## 2023-05-08 ENCOUNTER — Ambulatory Visit: Payer: Self-pay

## 2023-05-08 NOTE — Patient Outreach (Signed)
  Care Coordination   Follow Up Visit Note   05/08/2023 Name: Sayge Brienza MRN: [id number] DOB: 07/05/1928  Ashlee Warren is a 89 y.o. year old female who sees Joaquim Nam, MD for primary care. I spoke with  Hershal Coria by phone today.  What matters to the patients health and wellness today?  Patient states she is doing ok.  Discussed recent visit with PCP for dizziness.  Per chart review BP at 04/30/23 PCP visit was 116/ 72.  Lab work done and patients daughter notified of results by PCP office.  Patient denies any A-fib symptoms. She states the dizziness is occasional Patient denies any further needs or concerns and is agreeable that care coordination goals have been met.  Patient verbalized appreciation of RNCM assistance/ support.     Goals Addressed             This Visit's Progress    COMPLETED: Patient Stated:  Continue to manage my health conditions and maintanin my health."       Interventions Today    Flowsheet Row Most Recent Value  Chronic Disease   Chronic disease during today's visit Atrial Fibrillation (AFib), Other  [dizziness]  General Interventions   General Interventions Discussed/Reviewed General Interventions Reviewed, Doctor Visits, Labs, Communication with  [evaluation of current treatment plan for atrial fibrillation / dizziness and adherence to plan as established by provider.  Assessed for A-fib / dizziness symptoms.]  Doctor Visits Discussed/Reviewed Doctor Visits Reviewed  Annabell Sabal 04/30/23 provider visit with patient.  Advised patient of provider reviewe of lab work completed on 04/30/23. Reviewed upcoming provider visits.]  Communication with Social Work  Insurance claims handler with Child psychotherapist and notified her that [facility] with patient have been met]  Education Interventions   Education Provided Provided Education  [Discussed atrial fibrillation symptoms.  Advised to notify provider for mild symptoms and call 911 for severe symptoms. Patient advised to call  RNCM or PCP office if care coordination services needed in the future.]  Pharmacy Interventions   Pharmacy Dicussed/Reviewed Pharmacy Topics Reviewed  [medications reviewed and compliance discussed]  Safety Interventions   Safety Discussed/Reviewed Fall Risk  [Assessed for falls. Discussed dizziness precautions.]                SDOH assessments and interventions completed:  No     Care Coordination Interventions:  Yes, provided   Follow up plan: No further intervention required.   Encounter Outcome:  Pt. Visit Completed   George Ina RN,BSN,CCM [facility] Care Coordination [phone] direct line

## 2023-05-08 NOTE — Patient Instructions (Signed)
Visit Information  Thank you for taking time to visit with me today. Please don't hesitate to contact me if I can be of assistance to you.     Goals Addressed             This Visit's Progress    COMPLETED: Patient Stated:  Continue to manage my health conditions and maintanin my health."       Interventions Today    Flowsheet Row Most Recent Value  Chronic Disease   Chronic disease during today's visit Atrial Fibrillation (AFib), Other  [dizziness]  General Interventions   General Interventions Discussed/Reviewed General Interventions Reviewed, Doctor Visits, Labs, Communication with  [evaluation of current treatment plan for atrial fibrillation / dizziness and adherence to plan as established by provider.  Assessed for A-fib / dizziness symptoms.]  Doctor Visits Discussed/Reviewed Doctor Visits Reviewed  Annabell Sabal 04/30/23 provider visit with patient.  Advised patient of provider reviewe of lab work completed on 04/30/23. Reviewed upcoming provider visits.]  Communication with Social Work  Insurance claims handler with Child psychotherapist and notified her that [facility] with patient have been met]  Education Interventions   Education Provided Provided Education  [Discussed atrial fibrillation symptoms.  Advised to notify provider for mild symptoms and call 911 for severe symptoms. Patient advised to call RNCM or PCP office if care coordination services needed in the future.]  Pharmacy Interventions   Pharmacy Dicussed/Reviewed Pharmacy Topics Reviewed  [medications reviewed and compliance discussed]  Safety Interventions   Safety Discussed/Reviewed Fall Risk  [Assessed for falls. Discussed dizziness precautions.]               Please contact your primary care provider or RN care manager if care coordination services needed in the future.   If you are experiencing a Mental Health or Behavioral Health Crisis or need someone to talk to, please call the Suicide and Crisis Lifeline: 988 call  1-800-273-TALK (toll free, 24 hour hotline)  Patient verbalizes understanding of instructions and care plan provided today and agrees to view in MyChart. Active MyChart status and patient understanding of how to access instructions and care plan via MyChart confirmed with patient.     George Ina RN,BSN,CCM [facility] Care Coordination [phone] direct line

## 2023-05-14 ENCOUNTER — Ambulatory Visit: Payer: Medicare Other

## 2023-05-14 DIAGNOSIS — I442 Atrioventricular block, complete: Secondary | ICD-10-CM

## 2023-06-05 NOTE — Progress Notes (Signed)
Remote pacemaker transmission.   

## 2023-06-12 ENCOUNTER — Telehealth: Payer: Self-pay | Admitting: Family Medicine

## 2023-06-12 NOTE — Telephone Encounter (Signed)
Prescription Request  06/12/2023  LOV: 04/30/2023  What is the name of the medication or equipment? diltiazem (CARDIZEM CD) 120 MG 24 hr capsule   Have you contacted your pharmacy to request a refill? Yes   Which pharmacy would you like this sent to?  [id number] [place], [place] - [address] [place] [address] Phone: [phone] Fax: [phone]    Patient notified that their request is being sent to the clinical staff for review and that they should receive a response within 2 business days.   Please advise at Mobile [phone] (mobile)

## 2023-06-13 ENCOUNTER — Encounter: Payer: Self-pay | Admitting: Cardiovascular Disease

## 2023-06-13 ENCOUNTER — Other Ambulatory Visit: Payer: Self-pay

## 2023-06-13 MED ORDER — DILTIAZEM HCL ER COATED BEADS 120 MG PO CP24: 120.0000 mg | ORAL_CAPSULE | Freq: Every day | ORAL | 3 refills | Status: DC

## 2023-06-13 NOTE — Telephone Encounter (Signed)
Called and advised patients daughter that the medication listed is prescribed by patients cardiologist. She will reach out to their office.

## 2023-07-11 ENCOUNTER — Other Ambulatory Visit: Payer: Self-pay | Admitting: Family Medicine

## 2023-07-23 DIAGNOSIS — Z23 Encounter for immunization: Secondary | ICD-10-CM | POA: Diagnosis not present

## 2023-08-13 ENCOUNTER — Ambulatory Visit (INDEPENDENT_AMBULATORY_CARE_PROVIDER_SITE_OTHER): Payer: Medicare Other

## 2023-08-13 DIAGNOSIS — I4821 Permanent atrial fibrillation: Secondary | ICD-10-CM

## 2023-08-13 DIAGNOSIS — I442 Atrioventricular block, complete: Secondary | ICD-10-CM

## 2023-08-14 LAB — CUP PACEART REMOTE DEVICE CHECK
Battery Remaining Longevity: 139 mo
Battery Voltage: 3.02 V
Brady Statistic RV Percent Paced: 99.9 %
Date Time Interrogation Session: 20241028235249
Implantable Lead Connection Status: 753985
Implantable Lead Implant Date: 20221028
Implantable Lead Location: 753860
Implantable Lead Model: 1948
Implantable Pulse Generator Implant Date: 20221028
Lead Channel Impedance Value: 418 Ohm
Lead Channel Impedance Value: 570 Ohm
Lead Channel Pacing Threshold Amplitude: 0.75 V
Lead Channel Pacing Threshold Pulse Width: 0.4 ms
Lead Channel Sensing Intrinsic Amplitude: 10.875 mV
Lead Channel Sensing Intrinsic Amplitude: 10.875 mV
Lead Channel Setting Pacing Amplitude: 2 V
Lead Channel Setting Pacing Pulse Width: 0.4 ms
Lead Channel Setting Sensing Sensitivity: 0.9 mV
Zone Setting Status: 755011

## 2023-09-03 NOTE — Progress Notes (Signed)
Remote pacemaker transmission.   

## 2023-09-19 ENCOUNTER — Ambulatory Visit: Payer: Medicare Other

## 2023-09-19 VITALS — Ht 62.0 in | Wt 119.0 lb

## 2023-09-19 DIAGNOSIS — Z0000 Encounter for general adult medical examination without abnormal findings: Secondary | ICD-10-CM

## 2023-09-19 NOTE — Progress Notes (Addendum)
Subjective:   Ashlee Warren is a 89 y.o. female who presents for Medicare Annual (Subsequent) preventive examination.  Visit Complete: Virtual I connected with  Ashlee Warren on 09/19/23 by a audio enabled telemedicine application and verified that I am speaking with the correct person using two identifiers.  Patient Location: Home  Provider Location: Home Office  I discussed the limitations of evaluation and management by telemedicine. The patient expressed understanding and agreed to proceed.  Vital Signs: Because this visit was a virtual/telehealth visit, some criteria may be missing or patient reported. Any vitals not documented were not able to be obtained and vitals that have been documented are patient reported.  Patient Medicare AWV questionnaire was completed by the patient on (not done); I have confirmed that all information answered by patient is correct and no changes since this date. Cardiac Risk Factors include: advanced age (>32men, >53 women);hypertension;sedentary lifestyle    Objective:    Today's Vitals   09/19/23 1340  Weight: 119 lb (54 kg)  Height: 5\' 2"  (1.575 m)   Body mass index is 21.77 kg/m.     09/19/2023    1:56 PM 09/17/2022    9:13 AM 09/14/2021    9:52 AM 08/21/2021    1:07 PM 09/06/2020    1:22 PM 05/21/2018    8:50 AM 03/04/2017    1:58 PM  Advanced Directives  Does Patient Have a Medical Advance Directive? Yes Yes Yes Yes Yes No No  Type of Estate agent of [place];Living will Healthcare Power of [employer] of [place];Living will Healthcare Power of [place];Living will Living will;Healthcare Power of Attorney    Does patient want to make changes to medical advance directive?    No - Patient declined  No - Patient declined   Copy of Healthcare Power of Attorney in Chart? No - copy requested No - copy requested   No - copy requested    Would patient like information on creating a medical advance  directive?      No - Patient declined     Current Medications (verified) Outpatient Encounter Medications as of 09/19/2023  Medication Sig   acetaminophen (TYLENOL) 500 MG tablet Take 1-2 tablets (500-1,000 mg total) by mouth every 8 (eight) hours as needed.   diltiazem (CARDIZEM CD) 120 MG 24 hr capsule Take 1 capsule (120 mg total) by mouth daily.   Multiple Vitamins-Minerals (PRESERVISION AREDS 2+MULTI VIT PO) Take 1 capsule by mouth in the morning and at bedtime.    XARELTO 15 MG TABS tablet TAKE 1 TABLET (15 MG TOTAL) BY MOUTH DAILY WITH SUPPER   diclofenac Sodium (VOLTAREN) 1 % GEL Apply 2 g topically 4 (four) times daily as needed. (Patient not taking: Reported on 09/19/2023)   No facility-administered encounter medications on file as of 09/19/2023.    Allergies (verified) Penicillins, Sulfonamide derivatives, and Tamsulosin   History: Past Medical History:  Diagnosis Date   Atrial fibrillation (HCC)    Cataract    Dysrhythmia    afib   Essential hypertension    Past Surgical History:  Procedure Laterality Date   BREAST BIOPSY     left   CARDIOVERSION N/A 03/11/2013   Procedure: CARDIOVERSION;  Surgeon: Wendall Stade, MD;  Location: [facility] ENDOSCOPY;  Service: Cardiovascular;  Laterality: N/A;   CATARACT EXTRACTION, BILATERAL  2000   CYSTOSCOPY/URETEROSCOPY/HOLMIUM LASER/STENT PLACEMENT Right 09/16/2020   Procedure: CYSTOSCOPY, RETROGRADE PYELOGRAM, URETEROSCOPY, STENT PLACEMENT;  Surgeon: Jannifer Hick, MD;  Location: [facility] ORS;  Service: Urology;  Laterality: Right;  ONLY NEEDS 45 MIN   EYE SURGERY     HERNIA REPAIR     KIDNEY STONE SURGERY     PACEMAKER IMPLANT N/A 08/11/2021   Procedure: PACEMAKER IMPLANT;  Surgeon: Duke Salvia, MD;  Location: [facility] INVASIVE CV LAB;  Service: Cardiovascular;  Laterality: N/A;   TONSILLECTOMY     Family History  Problem Relation Age of Onset   Cancer Mother        leukemia   Cancer Sister    Social History   Socioeconomic History    Marital status: Widowed    Spouse name: Not on file   Number of children: Not on file   Years of education: Not on file   Highest education level: Not on file  Occupational History   Not on file  Tobacco Use   Smoking status: Never   Smokeless tobacco: Never  Vaping Use   Vaping status: Never Used  Substance and Sexual Activity   Alcohol use: No   Drug use: No   Sexual activity: Not Currently    Birth control/protection: None  Other Topics Concern   Not on file  Social History Narrative   Widowed 2021, was married 11/05/47   Retired Charity fundraiser, worked as Lawyer, 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     Social Determinants of Health   Financial Resource Strain: Low Risk  (09/19/2023)   Overall Financial Resource Strain (CARDIA)    Difficulty of Paying Living Expenses: Not hard at all  Food Insecurity: No Food Insecurity (09/19/2023)   Hunger Vital Sign    Worried About Running Out of Food in the Last Year: Never true    Ran Out of Food in the Last Year: Never true  Transportation Needs: No Transportation Needs (09/19/2023)   PRAPARE - Administrator, Civil Service (Medical): No    Lack of Transportation (Non-Medical): No  Physical Activity: Inactive (09/19/2023)   Exercise Vital Sign    Days of Exercise per Week: 0 days    Minutes of Exercise per Session: 0 min  Stress: No Stress Concern Present (09/19/2023)   [employer] of Occupational Health - Occupational Stress Questionnaire    Feeling of Stress : Not at all  Social Connections: Moderately Isolated (09/19/2023)   Social Connection and Isolation Panel [NHANES]    Frequency of Communication with Friends and Family: Three times a week    Frequency of Social Gatherings with Friends and Family: Twice a week    Attends Religious Services: More than 4 times per year    Active Member of [employer] or Organizations: No    Attends Banker Meetings: Never    Marital Status: Widowed     Tobacco Counseling Counseling given: Not Answered   Clinical Intake:  Pre-visit preparation completed: No  Pain : No/denies pain     BMI - recorded: 21.77 Nutritional Status: BMI of 19-24  Normal Nutritional Risks: None Diabetes: No  How often do you need to have someone help you when you read instructions, pamphlets, or other written materials from your doctor or pharmacy?: 1 - Never  Interpreter Needed?: No  Comments: lives alone Information entered by :: B.Lugenia Assefa,LPN   Activities of Daily Living    09/19/2023    1:56 PM  In your present state of health, do you have any difficulty performing the following activities:  Hearing? 0  Vision? 0  Difficulty concentrating or  making decisions? 1  Walking or climbing stairs? 0  Dressing or bathing? 0  Doing errands, shopping? 0  Preparing Food and eating ? N  Using the Toilet? N  In the past six months, have you accidently leaked urine? N  Do you have problems with loss of bowel control? N  Managing your Medications? N  Managing your Finances? N  Housekeeping or managing your Housekeeping? N    Patient Care Team: Joaquim Nam, MD as PCP - General (Family Medicine) Wendall Stade, MD as PCP - Cardiology (Cardiology) [facility]  Indicate any recent Medical Services you may have received from other than [facility] providers in the past year (date may be approximate).     Assessment:   This is a routine wellness examination for Ashlee Warren.  Hearing/Vision screen Hearing Screening - Comments:: Pt says her hearing is good with aids Vision Screening - Comments:: Pt says her vision is good   Goals Addressed             This Visit's Progress    COMPLETED: Increase physical activity       Starting 03/04/17, I will continue to exercise for at least 45 min twice weekly.      COMPLETED: Patient Stated   On track    Would like to maintain current routine       Depression Screen     09/19/2023    1:46 PM 04/30/2023   12:02 PM 11/22/2022    4:15 PM 09/17/2022    9:21 AM 09/14/2021    9:56 AM 08/23/2021   12:33 PM 08/21/2021    1:12 PM  PHQ 2/9 Scores  PHQ - 2 Score 0 1 3 1  0 0 0  PHQ- 9 Score  2 4 4        Fall Risk    09/19/2023    1:43 PM 04/30/2023   12:01 PM 11/22/2022    4:14 PM 09/17/2022    9:13 AM 12/04/2021   10:14 AM  Fall Risk   Falls in the past year? 0 0 0 0 1  Number falls in past yr: 0 0 0 0 0  Injury with Fall? 0 0 0 0 0  Risk for fall due to : No Fall Risks No Fall Risks No Fall Risks    Follow up Education provided;Falls prevention discussed Falls evaluation completed Falls evaluation completed Falls evaluation completed;Education provided;Falls prevention discussed     MEDICARE RISK AT HOME: Medicare Risk at Home Any stairs in or around the home?: Yes If so, are there any without handrails?: Yes Home free of loose throw rugs in walkways, pet beds, electrical cords, etc?: Yes Adequate lighting in your home to reduce risk of falls?: Yes Life alert?: No Use of a cane, walker or w/c?: No Grab bars in the bathroom?: Yes Shower chair or bench in shower?: No Elevated toilet seat or a handicapped toilet?: Yes  TIMED UP AND GO:  Was the test performed?  No    Cognitive Function:    05/21/2018    8:49 AM 03/04/2017    2:01 PM  MMSE - Mini Mental State Exam  Orientation to time 5 5  Orientation to Place 5 5  Registration 3 3  Attention/ Calculation 0 0  Recall 3 1  Recall-comments  pt was unable to recall 2 of 3 words  Language- name 2 objects 0 0  Language- repeat 1 1  Language- follow 3 step command 3 3  Language-  read & follow direction 0 0  Write a sentence 0 0  Copy design 0 0  Total score 20 18        09/19/2023    1:58 PM 09/17/2022    9:09 AM  6CIT Screen  What Year? 0 points 0 points  What month? 0 points 0 points  What time? 0 points 0 points  Count back from 20 0 points 0 points  Months in reverse 4 points 0 points  Repeat  phrase 0 points 2 points  Total Score 4 points 2 points    Immunizations Immunization History  Administered Date(s) Administered   Fluad Quad(high Dose 65+) 09/01/2020   Influenza, High Dose Seasonal PF 07/28/2021, 07/12/2022, 07/23/2023   Influenza,inj,Quad PF,6+ Mos 10/25/2015, 07/19/2016, 07/24/2017, 08/07/2018, 06/05/2019   Influenza-Unspecified 07/28/2021   PFIZER(Purple Top)SARS-COV-2 Vaccination 12/10/2019, 01/05/2020    TDAP status: Up to date  Flu Vaccine status: Up to date  Pneumococcal vaccine status: Up to date  Covid-19 vaccine status: Completed vaccines  Qualifies for Shingles Vaccine? Yes   Zostavax completed No   Shingrix Completed?: No.    Education has been provided regarding the importance of this vaccine. Patient has been advised to call insurance company to determine out of pocket expense if they have not yet received this vaccine. Advised may also receive vaccine at local pharmacy or Health Dept. Verbalized acceptance and understanding.  Screening Tests Health Maintenance  Topic Date Due   Zoster Vaccines- Shingrix (1 of 2) 12/18/2023 (Originally 03/18/1947)   DTaP/Tdap/Td (1 - Tdap) 09/18/2024 (Originally 03/18/1947)   Pneumonia Vaccine 71+ Years old (1 of 1 - PCV) 09/18/2024 (Originally 03/19/1995)   DEXA SCAN  03/04/2026 (Originally 03/19/1993)   Medicare Annual Wellness (AWV)  09/18/2024   INFLUENZA VACCINE  Completed   HPV VACCINES  Aged Out   COVID-19 Vaccine  Discontinued    Health Maintenance  There are no preventive care reminders to display for this patient.   Colorectal cancer screening: No longer required.   Mammogram status: No longer required due to age.  Lung Cancer Screening: (Low Dose CT Chest recommended if Age 78-80 years, 20 pack-year currently smoking OR have quit w/in 15years.) does not qualify.   Lung Cancer Screening Referral: no  Additional Screening:  Hepatitis C Screening: does not qualify; Completed none  Vision  Screening: Recommended annual ophthalmology exams for early detection of glaucoma and other disorders of the eye. Is the patient up to date with their annual eye exam?  No  Who is the provider or what is the name of the office in which the patient attends annual eye exams? Dr Dione Booze If pt is not established with a provider, would they like to be referred to a provider to establish care? No .   Dental Screening: Recommended annual dental exams for proper oral hygiene  Diabetic Foot Exam: n/a  Community Resource Referral / Chronic Care Management: CRR required this visit?  No   CCM required this visit?  No    Plan:     I have personally reviewed and noted the following in the patient's chart:   Medical and social history Use of alcohol, tobacco or illicit drugs  Current medications and supplements including opioid prescriptions. Patient is not currently taking opioid prescriptions. Functional ability and status Nutritional status Physical activity Advanced directives List of other physicians Hospitalizations, surgeries, and ER visits in previous 12 months Vitals Screenings to include cognitive, depression, and falls Referrals and appointments  In addition, I have  reviewed and discussed with patient certain preventive protocols, quality metrics, and best practice recommendations. A written personalized care plan for preventive services as well as general preventive health recommendations were provided to patient.   Sue Lush, LPN   [id number]/[id number]   After Visit Summary: (MyChart) Due to this being a telephonic visit, the after visit summary with patients personalized plan was offered to patient via MyChart   Nurse Notes: Pt says she is doing alright. She continues to live by herself and care for her home/self. Pt does exclaim she does NOT want to be in hospice or on life support. I encouraged pt to confer with her daughter to place wishes on her chart.

## 2023-09-19 NOTE — Patient Instructions (Signed)
Ms. Ashlee Warren , Thank you for taking time to come for your Medicare Wellness Visit. I appreciate your ongoing commitment to your health goals. Please review the following plan we discussed and let me know if I can assist you in the future.   Referrals/Orders/Follow-Ups/Clinician Recommendations: none  This is a list of the screening recommended for you and due dates:  Health Maintenance  Topic Date Due   DTaP/Tdap/Td vaccine (1 - Tdap) Never done   Zoster (Shingles) Vaccine (1 of 2) Never done   Pneumonia Vaccine (1 of 1 - PCV) Never done   DEXA scan (bone density measurement)  03/04/2026*   Medicare Annual Wellness Visit  09/18/2024   Flu Shot  Completed   HPV Vaccine  Aged Out   COVID-19 Vaccine  Discontinued  *Topic was postponed. The date shown is not the original due date.    Advanced directives: (Copy Requested) Please bring a copy of your health care power of attorney and living will to the office to be added to your chart at your convenience.  Next Medicare Annual Wellness Visit scheduled for next year: Yes 09/21/24 @ 1:40pm telephone

## 2023-09-30 ENCOUNTER — Telehealth: Payer: Self-pay | Admitting: Family Medicine

## 2023-09-30 NOTE — Telephone Encounter (Signed)
Spoke with patients daughter Bonita Quin advised of Dr. Armanda Heritage note. She will hold the xarelto for 2 days and call back and provide update. Is aware to go to ER if it gets worse.

## 2023-09-30 NOTE — Telephone Encounter (Signed)
I would hold xarelto until the bleeding stops but I would encouraged eval in the meantime.  If continued bleeding, rec ER eval.  Otherwise offer OV here.  Thanks.

## 2023-09-30 NOTE — Telephone Encounter (Signed)
Pt's daughter, Bonita Quin, called stating the pt has been having a small amount of light red blood within her bowel movements. Bonita Quin asked if the pt could be taken off her blood thinner meds for a few days, to see if its causing her to bleed? Bonita Quin declined triage for the pt. Call back # [phone]

## 2023-10-22 DIAGNOSIS — X32XXXD Exposure to sunlight, subsequent encounter: Secondary | ICD-10-CM | POA: Diagnosis not present

## 2023-10-22 DIAGNOSIS — C44311 Basal cell carcinoma of skin of nose: Secondary | ICD-10-CM | POA: Diagnosis not present

## 2023-10-22 DIAGNOSIS — L821 Other seborrheic keratosis: Secondary | ICD-10-CM | POA: Diagnosis not present

## 2023-10-22 DIAGNOSIS — L570 Actinic keratosis: Secondary | ICD-10-CM | POA: Diagnosis not present

## 2023-10-22 DIAGNOSIS — L258 Unspecified contact dermatitis due to other agents: Secondary | ICD-10-CM | POA: Diagnosis not present

## 2023-11-12 ENCOUNTER — Ambulatory Visit (INDEPENDENT_AMBULATORY_CARE_PROVIDER_SITE_OTHER): Payer: Medicare Other

## 2023-11-12 DIAGNOSIS — I442 Atrioventricular block, complete: Secondary | ICD-10-CM | POA: Diagnosis not present

## 2023-11-12 LAB — CUP PACEART REMOTE DEVICE CHECK
Battery Remaining Longevity: 136 mo
Battery Voltage: 3.02 V
Brady Statistic RV Percent Paced: 99.85 %
Date Time Interrogation Session: 20250128033652
Implantable Lead Connection Status: 753985
Implantable Lead Implant Date: 20221028
Implantable Lead Location: 753860
Implantable Lead Model: 1948
Implantable Pulse Generator Implant Date: 20221028
Lead Channel Impedance Value: 437 Ohm
Lead Channel Impedance Value: 589 Ohm
Lead Channel Pacing Threshold Amplitude: 0.625 V
Lead Channel Pacing Threshold Pulse Width: 0.4 ms
Lead Channel Sensing Intrinsic Amplitude: 10.875 mV
Lead Channel Sensing Intrinsic Amplitude: 10.875 mV
Lead Channel Setting Pacing Amplitude: 2 V
Lead Channel Setting Pacing Pulse Width: 0.4 ms
Lead Channel Setting Sensing Sensitivity: 0.9 mV
Zone Setting Status: 755011

## 2023-11-29 NOTE — Progress Notes (Signed)
 Patient ID: Ashlee Warren, female   DOB: [birth year], 89 y.o.   MRN: [id number]     89 y.o. chronic afib on Xarelto with no bleeding issues. Had [facility] to NSR 03/11/12 Flecainide stopped due to QRS widening On ETT. No history of CAD. EF has been normal  CHADVASC 4 Significant anxiety ans somatization  Carotid plaque with no stenosis duplex 07/24/17  Has chronic bifasicular block RBBB/LAFB    Had cystoscopy with right ureteral stent 09/16/20 Hematuria resolved Xarelto dose decreased 15 mg   08/10/21 admitted with CHB junctional escape K/Cr ok TSH normal TTE with normal EF Had Medtronic MRI safe PPM placed and cardizem d/c Due to afib had single lead device placed   Post PPM with some postural dizziness Memory and activity level less but this has been since her husband died Rate response turned on 2021/11/25   Married 72 years  Ashlee Warren Husband passed 2021 she is trying to cope  She has 2 daughters in [place] and a son in [place]  Had inguinal hernia surgery with Ashlee Ashlee Warren in [place] Had a great experience   09/30/23 had some mild blood tinged stool. Held her xarelto for a couple days an resolved Hct normally runs around 42   Repeat BP 155/78 mmHg   ROS: Denies fever, malais, weight loss, blurry vision, decreased visual acuity, cough, sputum, SOB, hemoptysis, pleuritic pain, palpitaitons, heartburn, abdominal pain, melena, lower extremity edema, claudication, or rash.  All other systems reviewed and negative  General: There were no vitals taken for this visit. Affect appropriate Healthy:  appears stated age HEENT: normal Neck supple with no adenopathy JVP normal no bruits no thyromegaly Lungs clear with no wheezing and good diaphragmatic motion Heart:  S1/S2 no murmur, no rub, gallop or click PMI normal Abdomen: benighn, BS positve, no tenderness, no AAA no bruit.  No HSM or HJR left inguinal hernia not bulging  Distal pulses intact with no bruits No edema Neuro non-focal Skin warm  and dry No muscular weakness   Current Outpatient Medications  Medication Sig Dispense Refill   acetaminophen (TYLENOL) 500 MG tablet Take 1-2 tablets (500-1,000 mg total) by mouth every 8 (eight) hours as needed.     diclofenac Sodium (VOLTAREN) 1 % GEL Apply 2 g topically 4 (four) times daily as needed. (Patient not taking: Reported on 09/19/2023) 100 g 3   diltiazem (CARDIZEM CD) 120 MG 24 hr capsule Take 1 capsule (120 mg total) by mouth daily. 90 capsule 3   Multiple Vitamins-Minerals (PRESERVISION AREDS 2+MULTI VIT PO) Take 1 capsule by mouth in the morning and at bedtime.      XARELTO 15 MG TABS tablet TAKE 1 TABLET (15 MG TOTAL) BY MOUTH DAILY WITH SUPPER 90 tablet 1   No current facility-administered medications for this visit.    Allergies  Penicillins, Sulfonamide derivatives, and Tamsulosin   Electrocardiogram: 06/05/18 AFib rate 92 RBBB/LAFB no changes 12/14/19 afib rate 87 RBBB LAFB 11/29/2023 afib RBBB LAFB rate 88 11/29/2023 afib rate 71 LBBB V pacing   Assessment and Plan  PAF:  CHADVASC 4 on xarelto low dose  no bleeding issues  PPM:  Medtronic single lead implanted October 2022 for CHB single lead device with chronic afib Rate response on   HTN:  some white coat component can take extra cardizem if elevated at home   Carotid:  Some plaque no stenosis  duplex 01/25/21  Depression: reactive to Warren death f/u Ashlee Warren  Urology:   F/U Ashlee Warren post  cystoscopy with right ureteral stent no stone seen Held xarelto 2 days before without incident GI:  Right inguinal hernia. Nice surgical experience with Ashlee Ashlee Warren no issues holding DOAC Pain much improved   F/U in a  6 months with Ashlee Warren EP remote PPM checks F/U with me in a year   Ashlee Warren

## 2023-12-03 ENCOUNTER — Encounter: Payer: Self-pay | Admitting: Cardiovascular Disease

## 2023-12-03 ENCOUNTER — Ambulatory Visit: Payer: Medicare Other | Attending: Cardiovascular Disease | Admitting: Cardiovascular Disease

## 2023-12-03 VITALS — BP 172/86 | HR 72 | Ht 62.0 in | Wt 119.8 lb

## 2023-12-03 DIAGNOSIS — Z950 Presence of cardiac pacemaker: Secondary | ICD-10-CM | POA: Diagnosis not present

## 2023-12-03 DIAGNOSIS — I4891 Unspecified atrial fibrillation: Secondary | ICD-10-CM | POA: Insufficient documentation

## 2023-12-03 NOTE — Patient Instructions (Signed)

## 2023-12-06 ENCOUNTER — Ambulatory Visit (INDEPENDENT_AMBULATORY_CARE_PROVIDER_SITE_OTHER): Payer: Medicare Other | Admitting: Primary Care

## 2023-12-06 ENCOUNTER — Ambulatory Visit: Payer: Self-pay | Admitting: Family Medicine

## 2023-12-06 ENCOUNTER — Encounter (INDEPENDENT_AMBULATORY_CARE_PROVIDER_SITE_OTHER): Payer: Self-pay | Admitting: Primary Care

## 2023-12-06 VITALS — BP 129/64 | HR 71 | Resp 16 | Ht 62.0 in | Wt 121.4 lb

## 2023-12-06 DIAGNOSIS — R5383 Other fatigue: Secondary | ICD-10-CM | POA: Diagnosis not present

## 2023-12-06 NOTE — Telephone Encounter (Addendum)
 Chief Complaint: fatigue Symptoms: weakness Frequency: a few days Pertinent Negatives: Patient denies CP, SOB, URI sx, fever, injuries Disposition: [] ED /[] Urgent Care (no appt availability in office) / [x] Appointment(In office/virtual)/ []  [facility]/ [] Home Care/ [] Refused Recommended Disposition /[] [facility]/ []  Follow-up with PCP Additional Notes: Patient daughter, Bonita Quin calling with c/o BP and weakness. Daughter reports that she recently went to heart doctor and they wanted her to double her BP meds, but she did not d/t not having enough. She is also endorsing worsening fatigue for the past few days. Denies CP, SOB, URI sx, injuries. Of note, pt lives independently and is typically more active. Scheduled patient per protocol on [facility]06/[facility] at [facility]. Caregiver verbalized understanding and to call back/911 with worsening symptoms.    Copied from CRM [id number]. Topic: Clinical - Red Word Triage >> Dec 06, 2023  9:17 AM Sim Boast F wrote: Red Word that prompted transfer to Nurse Triage: Fatigue, blood pressure 155/55, pulse 72 Reason for Disposition  [1] MODERATE weakness (i.e., interferes with work, school, normal activities) AND [2] cause unknown  (Exceptions: Weakness from acute minor illness or poor fluid intake; weakness is chronic and not worse.)  Answer Assessment - Initial Assessment Questions 1. BLOOD PRESSURE: "What is the blood pressure?" "Did you take at least two measurements 5 minutes apart?"     153/55 just a minute ago - was 175/73 at heart doctor on Tuesday (cards instructed to double pills, but pt did not because she was afraid she'd run out of pills 2. ONSET: "When did you take your blood pressure?"     Fatigue, really tired afebrile 3. HOW: "How did you obtain the blood pressure?" (e.g., visiting nurse, automatic home BP monitor)     Auto BP 4. HISTORY: "Do you have a history of low blood pressure?" "What is your blood pressure normally?"     no 5.  MEDICINES: "Are you taking any medications for blood pressure?" If Yes, ask: "Have they been changed recently?"     TIADYLT ER 120 6. PULSE RATE: "Do you know what your pulse rate is?"      72 7. OTHER SYMPTOMS: "Have you been sick recently?" "Have you had a recent injury?"     Denies cough, congestion, fever "Just really, really tired" Denies any injury/falls  Answer Assessment - Initial Assessment Questions 1. DESCRIPTION: "Describe how you are feeling."     Really really tired 2. SEVERITY: "How bad is it?"  "Can you stand and walk?"   - MILD (0-3): Feels weak or tired, but does not interfere with work, school or normal activities.   - MODERATE (4-7): Able to stand and walk; weakness interferes with work, school, or normal activities.   - SEVERE (8-10): Unable to stand or walk; unable to do usual activities.     Mild-moderate 3. ONSET: "When did these symptoms begin?" (e.g., hours, days, weeks, months)     A few days 4. CAUSE: "What do you think is causing the weakness or fatigue?" (e.g., not drinking enough fluids, medical problem, trouble sleeping)     Drinking fluids, doesn't eat much, poor appetite per baseline  5. NEW MEDICINES:  "Have you started on any new medicines recently?" (e.g., opioid pain medicines, benzodiazepines, muscle relaxants, antidepressants, antihistamines, neuroleptics, beta blockers)     no 6. OTHER SYMPTOMS: "Do you have any other symptoms?" (e.g., chest pain, fever, cough, SOB, vomiting, diarrhea, bleeding, other areas of pain)     no  Protocols used: Blood  Pressure - Low-A-AH, Weakness (Generalized) and Fatigue-A-AH

## 2023-12-06 NOTE — Progress Notes (Signed)
  [facility]  Ashlee Warren, is a 89 y.o. female  JXB:[id number]  ZHY:[id number]  DOB - 1928-10-07  Chief Complaint  Patient presents with   Fatigue       Subjective:   Ashlee Warren is a 89 y.o. female here today for an acute visit. The only c/o is she feels fatigue. No problems updated.  Comprehensive ROS Pertinent positive and negative noted in HPI   Allergies  Allergen Reactions   Penicillins Rash   Sulfonamide Derivatives Rash   Tamsulosin Swelling    Swollen Lips    Past Medical History:  Diagnosis Date   Atrial fibrillation (HCC)    Cataract    Dysrhythmia    afib   Essential hypertension     Current Outpatient Medications on File Prior to Visit  Medication Sig Dispense Refill   acetaminophen (TYLENOL) 500 MG tablet Take 1-2 tablets (500-1,000 mg total) by mouth every 8 (eight) hours as needed.     diclofenac Sodium (VOLTAREN) 1 % GEL Apply 2 g topically 4 (four) times daily as needed. 100 g 3   Multiple Vitamins-Minerals (PRESERVISION AREDS 2+MULTI VIT PO) Take 1 capsule by mouth in the morning and at bedtime.      TIADYLT ER 120 MG 24 hr capsule Take 120 mg by mouth daily.     XARELTO 15 MG TABS tablet TAKE 1 TABLET (15 MG TOTAL) BY MOUTH DAILY WITH SUPPER 90 tablet 1   No current facility-administered medications on file prior to visit.   Health Maintenance  Topic Date Due   Zoster (Shingles) Vaccine (1 of 2) 12/18/2023*   DTaP/Tdap/Td vaccine (1 - Tdap) 09/18/2024*   Pneumonia Vaccine (1 of 2 - PCV) 09/18/2024*   DEXA scan (bone density measurement)  03/04/2026*   Medicare Annual Wellness Visit  09/18/2024   Flu Shot  Completed   HPV Vaccine  Aged Out   COVID-19 Vaccine  Discontinued  *Topic was postponed. The date shown is not the original due date.    Objective:   Vitals:   12/06/23 1147  BP: 129/64  Pulse: 71  Resp: 16  SpO2: 97%  Weight: 121 lb 6.4 oz (55.1 kg)  Height: 5\' 2"  (1.575 m)   Physical Exam Vitals  reviewed.  Neurological:     Mental Status: She is alert.     Assessment & Plan  Ashlee Warren was seen today for fatigue.  Diagnoses and all orders for this visit:  Fatigue, unspecified type -     Vitamin B12 -     CBC with Differential     Patient have been counseled extensively about nutrition and exercise. Other issues discussed during this visit include: low cholesterol diet, weight control and daily exercise, foot care, annual eye examinations at Ophthalmology, importance of adherence with medications and regular follow-up. We also discussed long term complications of uncontrolled diabetes and hypertension.   Follow up with PCP  The patient was given clear instructions to go to ER or return to medical center if symptoms don't improve, worsen or new problems develop. The patient verbalized understanding. The patient was told to call to get lab results if they haven't heard anything in the next week.   This note has been created with Education officer, environmental. Any transcriptional errors are unintentional.   Grayce Sessions, NP 12/07/2023, 8:36 PM

## 2023-12-06 NOTE — Telephone Encounter (Signed)
 Noted, has OV scheduled today.  Thanks.

## 2023-12-07 ENCOUNTER — Encounter: Payer: Self-pay | Admitting: Family Medicine

## 2023-12-07 LAB — CBC WITH DIFFERENTIAL/PLATELET
Basophils Absolute: 0 10*3/uL (ref 0.0–0.2)
Basos: 0 %
EOS (ABSOLUTE): 0 10*3/uL (ref 0.0–0.4)
Eos: 0 %
Hematocrit: 41.4 % (ref 34.0–46.6)
Hemoglobin: 13.4 g/dL (ref 11.1–15.9)
Immature Grans (Abs): 0.1 10*3/uL (ref 0.0–0.1)
Immature Granulocytes: 1 %
Lymphocytes Absolute: 1.3 10*3/uL (ref 0.7–3.1)
Lymphs: 6 %
MCH: 28.2 pg (ref 26.6–33.0)
MCHC: 32.4 g/dL (ref 31.5–35.7)
MCV: 87 fL (ref 79–97)
Monocytes Absolute: 1.7 10*3/uL — ABNORMAL HIGH (ref 0.1–0.9)
Monocytes: 8 %
Neutrophils Absolute: 17.3 10*3/uL — ABNORMAL HIGH (ref 1.4–7.0)
Neutrophils: 85 %
Platelets: 298 10*3/uL (ref 150–450)
RBC: 4.76 x10E6/uL (ref 3.77–5.28)
RDW: 13.3 % (ref 11.7–15.4)
WBC: 20.4 10*3/uL (ref 3.4–10.8)

## 2023-12-07 LAB — VITAMIN B12: Vitamin B-12: 448 pg/mL (ref 232–1245)

## 2023-12-09 NOTE — Telephone Encounter (Signed)
 See result note- please check on patient and see if her OV can get moved sooner.  Thanks.

## 2023-12-10 ENCOUNTER — Ambulatory Visit (INDEPENDENT_AMBULATORY_CARE_PROVIDER_SITE_OTHER): Payer: Medicare Other | Admitting: Internal Medicine

## 2023-12-10 ENCOUNTER — Encounter: Payer: Self-pay | Admitting: Internal Medicine

## 2023-12-10 VITALS — BP 112/80 | HR 70 | Temp 97.6°F | Ht 62.0 in | Wt 122.0 lb

## 2023-12-10 DIAGNOSIS — R5383 Other fatigue: Secondary | ICD-10-CM | POA: Diagnosis not present

## 2023-12-10 DIAGNOSIS — R197 Diarrhea, unspecified: Secondary | ICD-10-CM | POA: Diagnosis not present

## 2023-12-10 DIAGNOSIS — I4821 Permanent atrial fibrillation: Secondary | ICD-10-CM | POA: Diagnosis not present

## 2023-12-10 LAB — COMPREHENSIVE METABOLIC PANEL
ALT: 28 U/L (ref 0–35)
AST: 22 U/L (ref 0–37)
Albumin: 3.2 g/dL — ABNORMAL LOW (ref 3.5–5.2)
Alkaline Phosphatase: 78 U/L (ref 39–117)
BUN: 28 mg/dL — ABNORMAL HIGH (ref 6–23)
CO2: 28 meq/L (ref 19–32)
Calcium: 8.9 mg/dL (ref 8.4–10.5)
Chloride: 101 meq/L (ref 96–112)
Creatinine, Ser: 1.1 mg/dL (ref 0.40–1.20)
GFR: 43.25 mL/min — ABNORMAL LOW (ref >60.00)
Glucose, Bld: 108 mg/dL — ABNORMAL HIGH (ref 70–99)
Potassium: 3.5 meq/L (ref 3.5–5.1)
Sodium: 139 meq/L (ref 135–145)
Total Bilirubin: 1 mg/dL (ref 0.2–1.2)
Total Protein: 6 g/dL (ref 6.0–8.3)

## 2023-12-10 LAB — CBC
HCT: 37.8 % (ref 36.0–46.0)
Hemoglobin: 12.3 g/dL (ref 12.0–15.0)
MCHC: 32.7 g/dL (ref 30.0–36.0)
MCV: 87.1 fl (ref 78.0–100.0)
Platelets: 252 10*3/uL (ref 150.0–400.0)
RBC: 4.33 Mil/uL (ref 3.87–5.11)
RDW: 14 % (ref 11.5–15.5)
WBC: 15.4 10*3/uL — ABNORMAL HIGH (ref 4.0–10.5)

## 2023-12-10 LAB — TSH: TSH: 1.43 u[IU]/mL (ref 0.35–5.50)

## 2023-12-10 NOTE — Assessment & Plan Note (Signed)
 Non specific Had CBC with elevated WBC--discussed could be CLL if stays up, but unlikely any action needed Will recheck with thyroid and chemistries

## 2023-12-10 NOTE — Progress Notes (Signed)
 Subjective:    Patient ID: Ashlee Warren, female    DOB: April 07, 1928, 89 y.o.   MRN: [id number]  HPI Here with daughter Bonita Quin due to diarrhea  "I'm falling apart" Feels tired--giving out easy Harder for her to remember things  Then daughter prompts her about the diarrhea Feels their are "spurts"---and has had urgency Started about 3 weeks ago--daughter just heard about this Usually can make it in time Doesn't go every day--but went in 3-4 times yesterday (sometimes just gas though) No problems during sleep--just up to void  Did have some blood 2 weeks ago with wiping---better with holding xarelto for 2 days  Fever yesterday and sweats last night Broke today No abdominal pain No N/V  Giving her gatorade--since increased WBC at urgent care 4 days ago  Current Outpatient Medications on File Prior to Visit  Medication Sig Dispense Refill   acetaminophen (TYLENOL) 500 MG tablet Take 1-2 tablets (500-1,000 mg total) by mouth every 8 (eight) hours as needed.     diclofenac Sodium (VOLTAREN) 1 % GEL Apply 2 g topically 4 (four) times daily as needed. 100 g 3   Multiple Vitamins-Minerals (PRESERVISION AREDS 2+MULTI VIT PO) Take 1 capsule by mouth in the morning and at bedtime.      TIADYLT ER 120 MG 24 hr capsule Take 120 mg by mouth daily.     XARELTO 15 MG TABS tablet TAKE 1 TABLET (15 MG TOTAL) BY MOUTH DAILY WITH SUPPER 90 tablet 1   No current facility-administered medications on file prior to visit.    Allergies  Allergen Reactions   Penicillins Rash   Sulfonamide Derivatives Rash   Tamsulosin Swelling    Swollen Lips    Past Medical History:  Diagnosis Date   Atrial fibrillation (HCC)    Cataract    Dysrhythmia    afib   Essential hypertension     Past Surgical History:  Procedure Laterality Date   BREAST BIOPSY     left   CARDIOVERSION N/A 03/11/2013   Procedure: CARDIOVERSION;  Surgeon: Wendall Stade, MD;  Location: [facility] ENDOSCOPY;  Service:  Cardiovascular;  Laterality: N/A;   CATARACT EXTRACTION, BILATERAL  2000   CYSTOSCOPY/URETEROSCOPY/HOLMIUM LASER/STENT PLACEMENT Right 09/16/2020   Procedure: CYSTOSCOPY, RETROGRADE PYELOGRAM, URETEROSCOPY, STENT PLACEMENT;  Surgeon: Jannifer Hick, MD;  Location: [facility] ORS;  Service: Urology;  Laterality: Right;  ONLY NEEDS 45 MIN   EYE SURGERY     HERNIA REPAIR     KIDNEY STONE SURGERY     PACEMAKER IMPLANT N/A 08/11/2021   Procedure: PACEMAKER IMPLANT;  Surgeon: Duke Salvia, MD;  Location: [facility] INVASIVE CV LAB;  Service: Cardiovascular;  Laterality: N/A;   TONSILLECTOMY      Family History  Problem Relation Age of Onset   Cancer Mother        leukemia   Cancer Sister     Social History   Socioeconomic History   Marital status: Widowed    Spouse name: Not on file   Number of children: Not on file   Years of education: Not on file   Highest education level: Not on file  Occupational History   Not on file  Tobacco Use   Smoking status: Never   Smokeless tobacco: Never  Vaping Use   Vaping status: Never Used  Substance and Sexual Activity   Alcohol use: No   Drug use: No   Sexual activity: Not Currently    Birth control/protection: None  Other Topics Concern  Not on file  Social History Narrative   Widowed 2021, was married 11/05/47   Retired Charity fundraiser, worked as Lawyer, 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     Social Drivers of Corporate investment banker Strain: Low Risk  (09/19/2023)   Overall Financial Resource Strain (CARDIA)    Difficulty of Paying Living Expenses: Not hard at all  Food Insecurity: No Food Insecurity (09/19/2023)   Hunger Vital Sign    Worried About Running Out of Food in the Last Year: Never true    Ran Out of Food in the Last Year: Never true  Transportation Needs: No Transportation Needs (09/19/2023)   PRAPARE - Administrator, Civil Service (Medical): No    Lack of Transportation (Non-Medical): No   Physical Activity: Inactive (09/19/2023)   Exercise Vital Sign    Days of Exercise per Week: 0 days    Minutes of Exercise per Session: 0 min  Stress: No Stress Concern Present (09/19/2023)   [employer] of Occupational Health - Occupational Stress Questionnaire    Feeling of Stress : Not at all  Social Connections: Moderately Isolated (09/19/2023)   Social Connection and Isolation Panel [NHANES]    Frequency of Communication with Friends and Family: Three times a week    Frequency of Social Gatherings with Friends and Family: Twice a week    Attends Religious Services: More than 4 times per year    Active Member of [employer] or Organizations: No    Attends Banker Meetings: Never    Marital Status: Widowed  Intimate Partner Violence: Not At Risk (09/19/2023)   Humiliation, Afraid, Rape, and Kick questionnaire    Fear of Current or Ex-Partner: No    Emotionally Abused: No    Physically Abused: No    Sexually Abused: No   Review of Systems Eating okay Weight is stable No cough---but notices breathing when really tired No dysuria or hematuria Lives alone. Doesn't drive. Daughter brings her shopping. Still does her instrumental ADLs but she realized she needs more help    Objective:   Physical Exam Constitutional:      Appearance: Normal appearance.  Cardiovascular:     Rate and Rhythm: Normal rate and regular rhythm.     Heart sounds: No murmur heard.    No gallop.  Pulmonary:     Effort: Pulmonary effort is normal.     Breath sounds: Normal breath sounds.  Abdominal:     General: Bowel sounds are normal.     Palpations: Abdomen is soft.     Tenderness: There is no abdominal tenderness. There is no guarding or rebound.  Musculoskeletal:     Cervical back: Neck supple.     Right lower leg: No edema.     Left lower leg: No edema.  Lymphadenopathy:     Cervical: No cervical adenopathy.  Neurological:     Mental Status: She is alert.  Psychiatric:         Mood and Affect: Mood normal.            Assessment & Plan:

## 2023-12-10 NOTE — Telephone Encounter (Addendum)
 I spoke with Ashlee Warren (DPR signed) and pt is some better but still having diarrhea; Ashlee Warren would like to discuss lab results from 12/06/23 also WBC was 20.4. Linda scheduled appt with Dr Alphonsus Sias on 12/10/23 at 12 noon.UC & ED precautions given and Ashlee Warren voiced understanding. Pt is drinking fluids. Sending note to Dr Alphonsus Sias and Lorain Childes to Dr Para March as PCP. Ashlee Warren did not want to cancel appt already made with Dr Para March on 12/13/23.

## 2023-12-10 NOTE — Assessment & Plan Note (Signed)
 Paced Is on the xarelto and diltiazem

## 2023-12-10 NOTE — Telephone Encounter (Signed)
Noted. Thanks. See OV note.  

## 2023-12-10 NOTE — Assessment & Plan Note (Signed)
 Not really infectious by history Does drink a ton of milk --and ice cream No blood--just on toilet paper a couple of weeks back Gave low FODMAP info Observe only Could consider colestid if ongoing issues

## 2023-12-11 ENCOUNTER — Encounter: Payer: Self-pay | Admitting: Internal Medicine

## 2023-12-13 ENCOUNTER — Ambulatory Visit (INDEPENDENT_AMBULATORY_CARE_PROVIDER_SITE_OTHER): Payer: Medicare Other | Admitting: Family Medicine

## 2023-12-13 VITALS — BP 122/74 | HR 70 | Temp 97.9°F | Ht 62.0 in | Wt 124.0 lb

## 2023-12-13 DIAGNOSIS — R319 Hematuria, unspecified: Secondary | ICD-10-CM

## 2023-12-13 DIAGNOSIS — R413 Other amnesia: Secondary | ICD-10-CM | POA: Diagnosis not present

## 2023-12-13 DIAGNOSIS — R195 Other fecal abnormalities: Secondary | ICD-10-CM

## 2023-12-13 LAB — POC URINALSYSI DIPSTICK (AUTOMATED)
Bilirubin, UA: NEGATIVE
Glucose, UA: NEGATIVE
Ketones, UA: NEGATIVE
Leukocytes, UA: NEGATIVE
Nitrite, UA: NEGATIVE
Protein, UA: POSITIVE — AB
Spec Grav, UA: 1.015 (ref 1.010–1.025)
Urobilinogen, UA: 0.2 U/dL
pH, UA: 6 (ref 5.0–8.0)

## 2023-12-13 NOTE — Patient Instructions (Addendum)
 We'll culture your urine.  Try to collect the stool samples.  If you have a fever or belly pain, then let me know.  Take care.  Glad to see you.

## 2023-12-13 NOTE — Progress Notes (Signed)
 Hematuria on u/a, ucx pending. No burning with urination. U/a d/w pt at OV.    Last fever was 4 days ago.    D/w pt about using a pill box to help with med administration.    She had noted memory changes.   She is worried about staying at home alone and family is going to check on getting extra help at home.  She has more confusion on waking.    She wants to stay at home, d/w pt about getting extra help at home.    She has been passing mucous with BMs.    Meds, vitals, and allergies reviewed.   ROS: Per HPI unless specifically indicated in ROS section   Nad Anxious appearing but able to regain composure.  Speech at baseline, fluent Ncat Neck supple, no LA RRR Ctab Abd soft, not ttp.   Skin well perfused.

## 2023-12-14 LAB — URINE CULTURE
MICRO NUMBER:: 16143784
Result:: NO GROWTH
SPECIMEN QUALITY:: ADEQUATE

## 2023-12-15 ENCOUNTER — Encounter: Payer: Self-pay | Admitting: Family Medicine

## 2023-12-15 DIAGNOSIS — R195 Other fecal abnormalities: Secondary | ICD-10-CM | POA: Insufficient documentation

## 2023-12-15 NOTE — Assessment & Plan Note (Signed)
 With h/o fever, h/o hematuria on u/a.  D/w pt about options.  Ucx pending.  Collect stool samples.  If fever or belly pain, then update [place] at clinic.

## 2023-12-15 NOTE — Assessment & Plan Note (Signed)
 Likely exacerbated by recent illness, with ongoing grief related to loss of husband.  Family is going to check on getting extra help at home.  She has more confusion on waking.  She wants to stay at home, d/w pt about getting extra help at home.

## 2023-12-16 ENCOUNTER — Telehealth: Payer: Self-pay | Admitting: Family Medicine

## 2023-12-16 ENCOUNTER — Encounter: Payer: Self-pay | Admitting: Internal Medicine

## 2023-12-16 ENCOUNTER — Other Ambulatory Visit: Payer: Self-pay | Admitting: Radiology

## 2023-12-16 DIAGNOSIS — R195 Other fecal abnormalities: Secondary | ICD-10-CM | POA: Diagnosis not present

## 2023-12-16 NOTE — Telephone Encounter (Signed)
 Patient dropped of sample and is currently staying with daughter in [place] if any prescription needs to be called in please call into the [facility] on [address] in [place] [phone]  Daughter Linda's nuber is [phone]

## 2023-12-17 ENCOUNTER — Ambulatory Visit: Payer: Self-pay | Admitting: Family Medicine

## 2023-12-17 LAB — C. DIFFICILE GDH AND TOXIN A/B
GDH ANTIGEN: NOT DETECTED
MICRO NUMBER:: 16150669
SPECIMEN QUALITY:: ADEQUATE
TOXIN A AND B: NOT DETECTED

## 2023-12-17 NOTE — Telephone Encounter (Signed)
 I spoke with Ashlee Warren at [place] and pts daughter said pt is breathing in shallow and quick breaths, dry cough and weak. Disposition was ED and pt refused. I spoke with Ashlee Warren (DPRsigned) and for about one wk pt has had more shallow and quicker breathing, dry cough and weakness. No fever this week and No CP,H/A or dizziness ands no wheezing. Pt is with daughter in [place]. Pt only wants to see dr Ashlee Warren.. No available appts at [facility] today and pt does not want to go to another office UC or ED. Pt scheduled appt with dr Ashlee Warren on 12/18/23 at 11:15 with UC & ED precautions and pt and pts daughter voiced understanding. Sending note to Dr Ashlee Warren and Ashlee Warren to Dr Ashlee Warren as PCP.

## 2023-12-17 NOTE — Telephone Encounter (Signed)
 Noted. Thanks.

## 2023-12-17 NOTE — Telephone Encounter (Signed)
 FYI

## 2023-12-17 NOTE — Telephone Encounter (Signed)
 Symptoms are worrisome for pneumonia Will see her tomorrow unless she needs ER visit before

## 2023-12-17 NOTE — Telephone Encounter (Signed)
 Linda(Daughter) calling Chief Complaint: weakness, SOB- shorter breaths- coughs with deep breath Symptoms: SOB, weakness Frequency: 1 week- patient has recently been seen at office for diarrhea, abnormal stool  Disposition: [x] ED /[] Urgent Care (no appt availability in office) / [] Appointment(In office/virtual)/ []  [place] [facility]/ [] Home Care/ [] Refused Recommended Disposition /[] [place] [facility]/ []  Follow-up with PCP Additional Notes: Advised per protocol- increased SOB- shorter more frequent breaths, fatigue- patient will not go- only wants to see provider  Call to office- Artelia Laroche has been notified  Copied from CRM [id number]. Topic: General - Call Back - No Documentation >> Dec 17, 2023  8:57 AM Alcus Dad wrote: Reason for CRM: Duaghter of patient stated that she was calling to get lab results for stool sample. Daughter also stated that patient has shortness of breath and extremely weak.  Reason for Disposition  [1] MODERATE difficulty breathing (e.g., speaks in phrases, SOB even at rest, pulse 100-120) AND [2] NEW-onset or WORSE than normal  Answer Assessment - Initial Assessment Questions 1. RESPIRATORY STATUS: "Describe your breathing?" (e.g., wheezing, shortness of breath, unable to speak, severe coughing)      "Short breaths, dry cough with deeper breaths, weakness 2. ONSET: "When did this breathing problem begin?"      1 week at least, Monday has fever 3. PATTERN "Does the difficult breathing come and go, or has it been constant since it started?"      constant 4. SEVERITY: "How bad is your breathing?" (e.g., mild, moderate, severe)    - MILD: No SOB at rest, mild SOB with walking, speaks normally in sentences, can lie down, no retractions, pulse < 100.    - MODERATE: SOB at rest, SOB with minimal exertion and prefers to sit, cannot lie down flat, speaks in phrases, mild retractions, audible wheezing, pulse 100-120.    - SEVERE: Very SOB at rest, speaks in single words,  struggling to breathe, sitting hunched forward, retractions, pulse > 120      Mild/moderate 5. RECURRENT SYMPTOM: "Have you had difficulty breathing before?" If Yes, ask: "When was the last time?" and "What happened that time?"      Only with pacemaker 6. CARDIAC HISTORY: "Do you have any history of heart disease?" (e.g., heart attack, angina, bypass surgery, angioplasty)      Hx heart disease  8. CAUSE: "What do you think is causing the breathing problem?"      unsure 9. OTHER SYMPTOMS: "Do you have any other symptoms? (e.g., dizziness, runny nose, cough, chest pain, fever)     Recent dehydration  Protocols used: Breathing Difficulty-A-AH

## 2023-12-17 NOTE — Telephone Encounter (Signed)
 Agree with note from Dr. Alphonsus Sias. Thanks.

## 2023-12-18 ENCOUNTER — Encounter: Payer: Self-pay | Admitting: Internal Medicine

## 2023-12-18 ENCOUNTER — Encounter: Payer: Self-pay | Admitting: Family Medicine

## 2023-12-18 ENCOUNTER — Ambulatory Visit (INDEPENDENT_AMBULATORY_CARE_PROVIDER_SITE_OTHER): Admitting: Internal Medicine

## 2023-12-18 ENCOUNTER — Telehealth: Payer: Self-pay | Admitting: Family Medicine

## 2023-12-18 ENCOUNTER — Ambulatory Visit (INDEPENDENT_AMBULATORY_CARE_PROVIDER_SITE_OTHER)
Admission: RE | Admit: 2023-12-18 | Discharge: 2023-12-18 | Disposition: A | Discharge status: Home / Self Care | Source: Ambulatory Visit | Attending: Internal Medicine | Admitting: Internal Medicine

## 2023-12-18 VITALS — BP 116/60 | HR 71 | Resp 18 | Ht 62.0 in | Wt 122.0 lb

## 2023-12-18 DIAGNOSIS — J849 Interstitial pulmonary disease, unspecified: Secondary | ICD-10-CM | POA: Diagnosis not present

## 2023-12-18 DIAGNOSIS — J90 Pleural effusion, not elsewhere classified: Secondary | ICD-10-CM | POA: Diagnosis not present

## 2023-12-18 DIAGNOSIS — I4821 Permanent atrial fibrillation: Secondary | ICD-10-CM | POA: Diagnosis not present

## 2023-12-18 DIAGNOSIS — Z950 Presence of cardiac pacemaker: Secondary | ICD-10-CM | POA: Diagnosis not present

## 2023-12-18 DIAGNOSIS — R0602 Shortness of breath: Secondary | ICD-10-CM | POA: Diagnosis not present

## 2023-12-18 DIAGNOSIS — R5381 Other malaise: Secondary | ICD-10-CM | POA: Insufficient documentation

## 2023-12-18 LAB — GASTROINTESTINAL PATHOGEN PNL
CampyloBacter Group: NOT DETECTED
Norovirus GI/GII: NOT DETECTED
Rotavirus A: NOT DETECTED
Salmonella species: NOT DETECTED
Shiga Toxin 1: NOT DETECTED
Shiga Toxin 2: NOT DETECTED
Shigella Species: NOT DETECTED
Vibrio Group: NOT DETECTED
Yersinia enterocolitica: NOT DETECTED

## 2023-12-18 NOTE — Assessment & Plan Note (Addendum)
 CXR shows interstitial changes---seems more apparent than 2 years ago Small left pleural effusion This could be the cause of the dyspnea Will set up with pulmonary

## 2023-12-18 NOTE — Telephone Encounter (Signed)
 Copied from CRM [id number]. Topic: General - Other >> Dec 18, 2023  1:33 PM Gurney Maxin H wrote: Reason for CRM: Patients daughter is calling to find out more information from visit patient had today with Dr. Alphonsus Sias, daughter states patient has a spot on her lung and she needs to know more.States mom really doesn't know and she would like more information, thanks.  Signe Colt [phone]

## 2023-12-18 NOTE — Assessment & Plan Note (Addendum)
 She realizes she can't be alone now---she and family are exploring options Seems to have some anxiety about this Will make referral to social work

## 2023-12-18 NOTE — Addendum Note (Signed)
 Addended by: Eual Fines on: 12/18/2023 03:34 PM   Modules accepted: Orders

## 2023-12-18 NOTE — Assessment & Plan Note (Signed)
 Not really consistent with infection--but I will check CXR Doesn't sound like pulmonary vascular congestion either Some degree of anxiety over her functional decline

## 2023-12-18 NOTE — Progress Notes (Signed)
 Subjective:    Patient ID: Ashlee Warren, female    DOB: 1928/02/20, 89 y.o.   MRN: [id number]  HPI Here due to cough and shortness of breath With daughter's sig other  Having shortness of breath No "pattern"---not necessarily exertional Not in bed--but can happen just sitting Not related to eating---and no acid reflux  Some cough No sputum At most low grade fever--and feels cold at times No shakes No obvious wheeze Has been with daughter in [place] recently--due to limitations  Did see Dr Eden Emms 2 weeks ago Doesn't remember if it was going on then  Gets some upper abdominal soreness--but not true chest pain Not dizzy--but slight lightheaded feeling at times---has to hold on (?balance) No palpitations  Current Outpatient Medications on File Prior to Visit  Medication Sig Dispense Refill   acetaminophen (TYLENOL) 500 MG tablet Take 1-2 tablets (500-1,000 mg total) by mouth every 8 (eight) hours as needed.     diclofenac Sodium (VOLTAREN) 1 % GEL Apply 2 g topically 4 (four) times daily as needed. 100 g 3   Multiple Vitamins-Minerals (PRESERVISION AREDS 2+MULTI VIT PO) Take 1 capsule by mouth in the morning and at bedtime.      TIADYLT ER 120 MG 24 hr capsule Take 120 mg by mouth daily.     XARELTO 15 MG TABS tablet TAKE 1 TABLET (15 MG TOTAL) BY MOUTH DAILY WITH SUPPER 90 tablet 1   No current facility-administered medications on file prior to visit.    Allergies  Allergen Reactions   Penicillins Rash   Sulfonamide Derivatives Rash   Tamsulosin Swelling    Swollen Lips    Past Medical History:  Diagnosis Date   Atrial fibrillation (HCC)    Cataract    Dysrhythmia    afib   Essential hypertension     Past Surgical History:  Procedure Laterality Date   BREAST BIOPSY     left   CARDIOVERSION N/A 03/11/2013   Procedure: CARDIOVERSION;  Surgeon: Wendall Stade, MD;  Location: [facility] ENDOSCOPY;  Service: Cardiovascular;  Laterality: N/A;   CATARACT EXTRACTION,  BILATERAL  2000   CYSTOSCOPY/URETEROSCOPY/HOLMIUM LASER/STENT PLACEMENT Right 09/16/2020   Procedure: CYSTOSCOPY, RETROGRADE PYELOGRAM, URETEROSCOPY, STENT PLACEMENT;  Surgeon: Jannifer Hick, MD;  Location: [facility] ORS;  Service: Urology;  Laterality: Right;  ONLY NEEDS 45 MIN   EYE SURGERY     HERNIA REPAIR     KIDNEY STONE SURGERY     PACEMAKER IMPLANT N/A 08/11/2021   Procedure: PACEMAKER IMPLANT;  Surgeon: Duke Salvia, MD;  Location: [facility] INVASIVE CV LAB;  Service: Cardiovascular;  Laterality: N/A;   TONSILLECTOMY      Family History  Problem Relation Age of Onset   Cancer Mother        leukemia   Cancer Sister     Social History   Socioeconomic History   Marital status: Widowed    Spouse name: Not on file   Number of children: Not on file   Years of education: Not on file   Highest education level: GED or equivalent  Occupational History   Not on file  Tobacco Use   Smoking status: Never   Smokeless tobacco: Never  Vaping Use   Vaping status: Never Used  Substance and Sexual Activity   Alcohol use: No   Drug use: No   Sexual activity: Not Currently    Birth control/protection: None  Other Topics Concern   Not on file  Social History Narrative   Widowed  2021, was married 11/05/47   Retired Charity fundraiser, worked as Lawyer, 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     Social Drivers of Corporate investment banker Strain: Low Risk  (12/10/2023)   Overall Financial Resource Strain (CARDIA)    Difficulty of Paying Living Expenses: Not hard at all  Food Insecurity: No Food Insecurity (12/10/2023)   Hunger Vital Sign    Worried About Running Out of Food in the Last Year: Never true    Ran Out of Food in the Last Year: Never true  Transportation Needs: No Transportation Needs (12/10/2023)   PRAPARE - Administrator, Civil Service (Medical): No    Lack of Transportation (Non-Medical): No  Physical Activity: Inactive (12/10/2023)   Exercise Vital  Sign    Days of Exercise per Week: 0 days    Minutes of Exercise per Session: 0 min  Stress: No Stress Concern Present (12/10/2023)   [employer] of Occupational Health - Occupational Stress Questionnaire    Feeling of Stress : Only a little  Social Connections: Moderately Integrated (12/10/2023)   Social Connection and Isolation Panel [NHANES]    Frequency of Communication with Friends and Family: More than three times a week    Frequency of Social Gatherings with Friends and Family: More than three times a week    Attends Religious Services: More than 4 times per year    Active Member of [employer] or Organizations: Yes    Attends Banker Meetings: More than 4 times per year    Marital Status: Widowed  Recent Concern: Social Connections - Moderately Isolated (09/19/2023)   Social Connection and Isolation Panel [NHANES]    Frequency of Communication with Friends and Family: Three times a week    Frequency of Social Gatherings with Friends and Family: Twice a week    Attends Religious Services: More than 4 times per year    Active Member of [employer] or Organizations: No    Attends Banker Meetings: Never    Marital Status: Widowed  Intimate Partner Violence: Not At Risk (09/19/2023)   Humiliation, Afraid, Rape, and Kick questionnaire    Fear of Current or Ex-Partner: No    Emotionally Abused: No    Physically Abused: No    Sexually Abused: No   Review of Systems Sleep is variable Not eating great--kids bring her shopping or bring her stuff Still having bowel problems---hard to control but now soft (not liquid) Feels she can't manage her meds safely anymore    Objective:   Physical Exam Constitutional:      Appearance: Normal appearance.  Cardiovascular:     Rate and Rhythm: Normal rate and regular rhythm.     Heart sounds: No murmur heard.    No gallop.  Pulmonary:     Effort: Pulmonary effort is normal.     Breath sounds: No wheezing.     Comments:  Early inspiratory crackles at bases Musculoskeletal:     Cervical back: Neck supple.     Right lower leg: No edema.     Left lower leg: No edema.  Lymphadenopathy:     Cervical: No cervical adenopathy.  Neurological:     Mental Status: She is alert.     Comments: Getting some dates wrong--thought husband died 3 months ago (it was 2 years) Stated she was 75  Psychiatric:     Comments: Mildly anxious  Assessment & Plan:

## 2023-12-18 NOTE — Telephone Encounter (Addendum)
 Called and spoke to pt's daughter/HCPOA, Bonita Quin, about today's appointment. She said that the referrals need to call her and not the pt because the pt will forget the conversation or not be honest about it. She stated she and the pt and supposed to go check out an  assisted living facility tomorrow.   I went in and updated the referrals today with a note to call her daughter when scheduling.

## 2023-12-18 NOTE — Assessment & Plan Note (Signed)
 Paced now Doesn't seem to have CHF

## 2023-12-19 ENCOUNTER — Telehealth: Payer: Self-pay | Admitting: *Deleted

## 2023-12-19 NOTE — Progress Notes (Signed)
 Complex Care Management Note  Care Guide Note 12/19/2023 Name: Mai Longnecker MRN: [id number] DOB: 10-29-1927  Sutton Plake is a 89 y.o. year old female who sees Joaquim Nam, MD for primary care. I reached out to Hershal Coria by phone today to offer complex care management services.  Ms. Grandberry was given information about Complex Care Management services today including:   The Complex Care Management services include support from the care team which includes your Nurse Care Manager, Clinical Social Worker, or Pharmacist.  The Complex Care Management team is here to help remove barriers to the health concerns and goals most important to you. Complex Care Management services are voluntary, and the patient may decline or stop services at any time by request to their care team member.   Complex Care Management Consent Status: Patient agreed to services and verbal consent obtained.   Follow up plan:  Telephone appointment with complex care management team member scheduled for:  12/27/2023 and 01/03/2024  Encounter Outcome:  Patient Scheduled  Burman Nieves, CMA, Care Guide [facility]  [facility], [facility] Guide Direct Dial: [phone]  Fax: [phone] Website: [email or web address]

## 2023-12-19 NOTE — Telephone Encounter (Signed)
Spoke to pt's daughter. 

## 2023-12-22 NOTE — Progress Notes (Signed)
 Remote pacemaker transmission.

## 2023-12-27 ENCOUNTER — Ambulatory Visit: Payer: Self-pay | Admitting: Licensed Clinical Social Worker

## 2023-12-30 NOTE — Patient Outreach (Signed)
 Care Coordination   Initial Visit Note   12/27/2023 Name: Ashlee Warren MRN: [id number] DOB: 20-May-1928  Ashlee Warren is a 89 y.o. year old female who sees Joaquim Nam, MD for primary care. I spoke with  Ashlee Warren daughter, Ashlee Warren, by phone today.  What matters to the patients health and wellness today?  Symptom Management, Level of Care    Goals Addressed             This Visit's Progress    Obtain Supportive Resources-In Home Support   On track    Activities and task to complete in order to accomplish goals.   Keep all upcoming appointments discussed today Continue with compliance of taking medication prescribed by Doctor Implement healthy coping skills discussed to assist with management of symptoms         SDOH assessments and interventions completed:  Yes  SDOH Interventions Today    Flowsheet Row Most Recent Value  SDOH Interventions   Food Insecurity Interventions Intervention Not Indicated  Housing Interventions Intervention Not Indicated  Transportation Interventions Patient Resources (Friends/Family)  Utilities Interventions Intervention Not Indicated        Care Coordination Interventions:  Yes, provided  Interventions Today    Flowsheet Row Most Recent Value  Chronic Disease   Chronic disease during today's visit Atrial Fibrillation (AFib), Other  [Memory Concerns]  General Interventions   General Interventions Discussed/Reviewed General Interventions Discussed, Doctor Visits, Level of Care  Doctor Visits Discussed/Reviewed Doctor Visits Discussed  Level of Care Personal Care Services  [Family reports pt needs assistance in the home. They have secured an aid that visits pt 2-4 hrs a day and assists with med managmeent, showers, and preparing meals]  Mental Health Interventions   Mental Health Discussed/Reviewed Mental Health Discussed, Coping Strategies, Anxiety  [Strategies to assist with anxiety in between provider/specialist appts  discussed. Pt's daughter facetimes daily and visits weekly]  Nutrition Interventions   Nutrition Discussed/Reviewed Nutrition Discussed, Supplemental nutrition  Pharmacy Interventions   Pharmacy Dicussed/Reviewed Pharmacy Topics Discussed, Medication Adherence  Safety Interventions   Safety Discussed/Reviewed Safety Discussed       Follow up plan: Follow up call scheduled for 2-4 weeks    Encounter Outcome:  Patient Visit Completed   Jenel Lucks, LCSW [place]  [facility], [facility] Clinical Social Worker Direct Dial: [phone]  Fax: [phone] Website: [email or web address] 8:26 AM

## 2023-12-30 NOTE — Patient Instructions (Signed)
 Visit Information  Thank you for taking time to visit with me today. Please don't hesitate to contact me if I can be of assistance to you.   Following are the goals we discussed today:   Goals Addressed             This Visit's Progress    Obtain Supportive Resources-In Home Support   On track    Activities and task to complete in order to accomplish goals.   Keep all upcoming appointments discussed today Continue with compliance of taking medication prescribed by Doctor Implement healthy coping skills discussed to assist with management of symptoms         Our next appointment is by telephone on 3/28 at 11:30 AM  Please call the care guide team at [phone] if you need to cancel or reschedule your appointment.   If you are experiencing a Mental Health or Behavioral Health Crisis or need someone to talk to, please call the Suicide and Crisis Lifeline: 988 call 911   Patient verbalizes understanding of instructions and care plan provided today and agrees to view in MyChart. Active MyChart status and patient understanding of how to access instructions and care plan via MyChart confirmed with patient.     Windy Fast [facility]  [facility], [facility] Clinical Social Worker Direct Dial: [phone]  Fax: [phone] Website: [email or web address] 8:27 AM

## 2024-01-03 ENCOUNTER — Ambulatory Visit: Payer: Self-pay

## 2024-01-03 NOTE — Patient Instructions (Addendum)
 Visit Information  Thank you for taking time to visit with me today. Please don't hesitate to contact me if I can be of assistance to you.   Following are the goals we discussed today:   Goals Addressed             This Visit's Progress    Patient Stated       To optimize symptom management for ILD       Care Coordination Interventions: Evaluation of current treatment plan related to Interstitial lung disease and patient's adherence to plan as established by provider Review of patient status, including review of consultant's reports, relevant laboratory and other test results, and medication reconciliation completed Determined patient experiences more shortness of breath than in the past, she has used inhalers in the past but not recently  Reviewed and discussed with daughter Bonita Quin, patient is scheduled for a new patient appointment with [facility] Pulmonology, Dr. Marchelle Gearing scheduled for 01/07/24 @09 :30 AM, patient's daughter will accompany her to the appointment  Discussed plans with patient for ongoing care coordination follow up and provided patient with direct contact information for nurse care coordinator Informed daughter Bonita Quin of nurse transfer to George Ina RN Scheduled nurse follow up call for 01/21/24 @10 :00 AM        Our next appointment is by telephone on 01/07/24 at 09:30 AM  Please call the care guide team at [phone] if you need to cancel or reschedule your appointment.   If you are experiencing a Mental Health or Behavioral Health Crisis or need someone to talk to, please call 1-800-273-TALK (toll free, 24 hour hotline)  Patient verbalizes understanding of instructions and care plan provided today and agrees to view in MyChart. Active MyChart status and patient understanding of how to access instructions and care plan via MyChart confirmed with patient.     Delsa Sale RN BSN CCM [place]  [facility], [facility] Nurse Care Coordinator   Direct Dial: [phone] Website: [email or web address]

## 2024-01-03 NOTE — Patient Outreach (Signed)
 Care Coordination   Follow Up Visit Note   01/03/2024 Name: Ashlee Warren MRN: [id number] DOB: 02/07/1928  Ashlee Warren is a 89 y.o. year old female who sees Ashlee Nam, MD for primary care. I spoke with  Ashlee Warren by phone today.  What matters to the patients health and wellness today?  Daughter would like to have patient restarted on an inhaler.     Goals Addressed             This Visit's Progress    Patient Stated       To optimize symptom management for ILD       Care Coordination Interventions: Evaluation of current treatment plan related to Interstitial lung disease and patient's adherence to plan as established by provider Review of patient status, including review of consultant's reports, relevant laboratory and other test results, and medication reconciliation completed Determined patient experiences more shortness of breath than in the past, she has used inhalers in the past but not recently  Reviewed and discussed with daughter Ashlee Warren, patient is scheduled for a new patient appointment with [facility] Pulmonology, Dr. Marchelle Gearing scheduled for 01/07/24 @09 :30 AM, patient's daughter will accompany her to the appointment  Discussed plans with patient for ongoing care coordination follow up and provided patient with direct contact information for nurse care coordinator Informed daughter Ashlee Warren of nurse transfer to George Ina RN Scheduled nurse follow up call for 01/21/24 @10 :00 AM    Interventions Today    Flowsheet Row Most Recent Value  Chronic Disease   Chronic disease during today's visit Other  [ILD,  change in memory]  General Interventions   General Interventions Discussed/Reviewed General Interventions Discussed, General Interventions Reviewed, Doctor Visits, Labs  Doctor Visits Discussed/Reviewed Doctor Visits Discussed, Doctor Visits Reviewed, PCP, Specialist  Exercise Interventions   Exercise Discussed/Reviewed Physical Activity, Exercise Reviewed,  Exercise Discussed  Physical Activity Discussed/Reviewed Physical Activity Reviewed, Physical Activity Discussed  Education Interventions   Education Provided Provided Education  Provided Verbal Education On Medication, When to see the doctor, Mental Health/Coping with Illness, Nutrition  Mental Health Interventions   Mental Health Discussed/Reviewed Mental Health Discussed, Mental Health Reviewed, Other  [mood change]  Nutrition Interventions   Nutrition Discussed/Reviewed Nutrition Discussed, Nutrition Reviewed, Increasing proteins, Fluid intake  Pharmacy Interventions   Pharmacy Dicussed/Reviewed Pharmacy Topics Discussed, Pharmacy Topics Reviewed, Medications and their functions  Safety Interventions   Safety Discussed/Reviewed Safety Discussed, Safety Reviewed, Fall Risk, Home Safety          SDOH assessments and interventions completed:  No     Care Coordination Interventions:  Yes, provided   Follow up plan: Follow up call scheduled for 01/07/24 @09 :30 AM    Encounter Outcome:  Patient Visit Completed

## 2024-01-06 ENCOUNTER — Encounter: Payer: Self-pay | Admitting: Internal Medicine

## 2024-01-07 ENCOUNTER — Encounter: Payer: Self-pay | Admitting: Internal Medicine

## 2024-01-07 ENCOUNTER — Ambulatory Visit (INDEPENDENT_AMBULATORY_CARE_PROVIDER_SITE_OTHER): Admitting: Internal Medicine

## 2024-01-07 VITALS — BP 126/74 | HR 70 | Temp 97.5°F | Ht 62.0 in | Wt 115.0 lb

## 2024-01-07 DIAGNOSIS — J849 Interstitial pulmonary disease, unspecified: Secondary | ICD-10-CM

## 2024-01-07 DIAGNOSIS — R0609 Other forms of dyspnea: Secondary | ICD-10-CM | POA: Diagnosis not present

## 2024-01-07 LAB — SEDIMENTATION RATE: Sed Rate: 44 mm/h — ABNORMAL HIGH (ref 0–30)

## 2024-01-07 NOTE — Progress Notes (Signed)
 OV 01/07/2024  Subjective:  Patient ID: Ashlee Warren, female , DOB: [birth year] , age 89 y.o. , MRN: [id number] , ADDRESS: [address] [place] [id number] PCP Joaquim Nam, MD Patient Care Team: Joaquim Nam, MD as PCP - General (Family Medicine) Wendall Stade, MD as PCP - Cardiology (Cardiology) [facility] Bridgett Larsson, LCSW as [facility] Care Management (Licensed Clinical Social Worker) Otho Ket, RN as [facility] Care Management  This Provider for this visit: Treatment Team:  Attending Provider: Kalman Shan, MD    01/07/2024 -   Chief Complaint  Patient presents with   Pulmonary Consult    Referred by Dr Alphonsus Sias for possible ILD. She states usually every winter she gets a bad cold, but has not had one this year. She has occ SOB- just comes out of the blue.      HPI Ashlee Warren 89 y.o. -former Engineer, civil (consulting).  Also former patient of Dr. Marga Melnick back in 2006.  She said she also used to work for him.  She broke into a big smile when she heard of Dr. Marga Melnick and the fact that he is now also doing research and pulmonary.  She is here with her daughter.  They report that for the last several weeks or maybe even more she has been having some chronic diarrhea that is undergoing workup.  During this time she reported being short of breath.  She states it is very random and there is no clear-cut pattern.  I tried my best to elicit her dyspnea history more specifically but could not.  Does not much of a cough or wheezing.  Chest x-ray was done and showed ILD which I personally visualized and agreed with that this was early March 2025.  She wanted to know more about this.  I have visualized old images and I did find a CT angiogram chest from 11 years ago that showed presence of ILD.  She was not aware of it back then.  Was done to rule out pulmonary embolism.  She states that dyspnea is mild.  Exercise hypoxemia test today was like  normal.  She is is wondering if a CT scan is really needed and how extensive the workup needs to be because she has a good quality of life and she really says she is not bothered by shortness of breath.  I did indicate to her that pulmonary fibrosis can be progressive and much of this is about future.  Therefore she is willing to undergo a limited workup.    SYMPTOM SCALE - ILD 01/07/2024  Current weight   O2 use ra  Shortness of Breath 0 -> 5 scale with 5 being worst (score 6 If unable to do)  At rest 1  Simple tasks - showers, clothes change, eating, shaving 0  Household (dishes, doing bed, laundry) 0  Shopping 0  Walking level at own pace 1  Walking up Stairs 0  Total (30-36) Dyspnea Score 2  How bad is your cough? 0.5  How bad is your fatigue 1  How bad is nausea 0  How bad is vomiting?  0  How bad is diarrhea? 3  How bad is anxiety? 2  How bad is depression 2  Any chronic pain - if so where and how bad 0        Simple office walk 224 (66+46 x 2) feet Pod A at [facility] x  3 laps  goal with forehead probe 01/07/2024    O2 used ra   Number laps completed Walked x 3 laps   Comments about pace modrate   Resting Pulse Ox/HR 98% and 72/min   Final Pulse Ox/HR 99% and 92/min   Desaturated </= 88% no   Desaturated <= 3% points no   Got Tachycardic >/= 90/min yes   Symptoms at end of test No dyspnea   Miscellaneous comments x      LAB RESULTS last 96 hours No results found.       has a past medical history of Atrial fibrillation (HCC), Cataract, Dysrhythmia, and Essential hypertension.   reports that she has never smoked. She has never been exposed to tobacco smoke. She has never used smokeless tobacco.  Past Surgical History:  Procedure Laterality Date   BREAST BIOPSY     left   CARDIOVERSION N/A 03/11/2013   Procedure: CARDIOVERSION;  Surgeon: Wendall Stade, MD;  Location: [facility] ENDOSCOPY;  Service: Cardiovascular;  Laterality: N/A;   CATARACT EXTRACTION,  BILATERAL  2000   CYSTOSCOPY/URETEROSCOPY/HOLMIUM LASER/STENT PLACEMENT Right 09/16/2020   Procedure: CYSTOSCOPY, RETROGRADE PYELOGRAM, URETEROSCOPY, STENT PLACEMENT;  Surgeon: Jannifer Hick, MD;  Location: [facility] ORS;  Service: Urology;  Laterality: Right;  ONLY NEEDS 45 MIN   EYE SURGERY     HERNIA REPAIR     KIDNEY STONE SURGERY     PACEMAKER IMPLANT N/A 08/11/2021   Procedure: PACEMAKER IMPLANT;  Surgeon: Duke Salvia, MD;  Location: [facility] INVASIVE CV LAB;  Service: Cardiovascular;  Laterality: N/A;   TONSILLECTOMY      Allergies  Allergen Reactions   Penicillins Rash   Sulfonamide Derivatives Rash   Tamsulosin Swelling    Swollen Lips    Immunization History  Administered Date(s) Administered   Fluad Quad(high Dose 65+) 09/01/2020   Influenza, High Dose Seasonal PF 07/28/2021, 07/12/2022, 07/23/2023   Influenza,inj,Quad PF,6+ Mos 10/25/2015, 07/19/2016, 07/24/2017, 08/07/2018, 06/05/2019   Influenza-Unspecified 07/28/2021   PFIZER(Purple Top)SARS-COV-2 Vaccination 12/10/2019, 01/05/2020    Family History  Problem Relation Age of Onset   Cancer Mother        leukemia   Cancer Sister      Current Outpatient Medications:    acetaminophen (TYLENOL) 500 MG tablet, Take 1-2 tablets (500-1,000 mg total) by mouth every 8 (eight) hours as needed., Disp: , Rfl:    diclofenac Sodium (VOLTAREN) 1 % GEL, Apply 2 g topically 4 (four) times daily as needed., Disp: 100 g, Rfl: 3   Multiple Vitamins-Minerals (PRESERVISION AREDS 2+MULTI VIT PO), Take 1 capsule by mouth in the morning and at bedtime. , Disp: , Rfl:    TIADYLT ER 120 MG 24 hr capsule, Take 120 mg by mouth daily., Disp: , Rfl:    XARELTO 15 MG TABS tablet, TAKE 1 TABLET (15 MG TOTAL) BY MOUTH DAILY WITH SUPPER (Patient taking differently: Take 15 mg by mouth daily.), Disp: 90 tablet, Rfl: 1      Objective:   Vitals:   01/07/24 0959  BP: 126/74  Pulse: 70  Temp: (!) 97.5 F (36.4 C)  TempSrc: Oral  SpO2: 100%   Weight: 115 lb (52.2 kg)  Height: 5\' 2"  (1.575 m)    Estimated body mass index is 21.03 kg/m as calculated from the following:   Height as of this encounter: 5\' 2"  (1.575 m).   Weight as of this encounter: 115 lb (52.2 kg).  @WEIGHTCHANGE @  [employer]   01/07/24 0959  Weight: 115 lb (52.2 kg)  Physical Exam   General: No distress. Looks well O2 at rest: no Cane present: no Sitting in wheel chair: no Frail: mild.  Obese: no Neuro: Alert and Oriented x 3. GCS 15. Speech normal Psych: Pleasant Resp:  Barrel Chest - no.  Wheeze - no, Crackles - YES BILETARL LL In INFRA=Zaillary are, No overt respiratory distress CVS: Normal heart sounds. Murmurs - no Ext: Stigmata of Connective Tissue Disease - no HEENT: Normal upper airway. PEERL +. No post nasal drip        Assessment:       ICD-10-CM   1. ILD (interstitial lung disease) (HCC)  J84.9 Antinuclear Antib (ANA)    Rheumatoid factor    Cyclic citrul peptide antibody, IgG    Sjogrens syndrome-A extractable nuclear antibody    Sjogrens syndrome-B extractable nuclear antibody    Anti-scleroderma antibody    QuantiFERON-TB Gold Plus    CT Chest High Resolution    Sed Rate (ESR)    Pulse oximetry, overnight    Hypersensitivity pnuemonitis profile    Hypersensitivity pnuemonitis profile    Sed Rate (ESR)    QuantiFERON-TB Gold Plus    Anti-scleroderma antibody    Cyclic citrul peptide antibody, IgG    Rheumatoid factor    Antinuclear Antib (ANA)    2. DOE (dyspnea on exertion)  R06.09 Antinuclear Antib (ANA)    Rheumatoid factor    Cyclic citrul peptide antibody, IgG    Sjogrens syndrome-A extractable nuclear antibody    Sjogrens syndrome-B extractable nuclear antibody    Anti-scleroderma antibody    QuantiFERON-TB Gold Plus    CT Chest High Resolution    Sed Rate (ESR)    Pulse oximetry, overnight    Hypersensitivity pnuemonitis profile    Hypersensitivity pnuemonitis profile    Sed Rate (ESR)     QuantiFERON-TB Gold Plus    Anti-scleroderma antibody    Cyclic citrul peptide antibody, IgG    Rheumatoid factor    Antinuclear Antib (ANA)         Plan:     Patient Instructions     ICD-10-CM   1. ILD (interstitial lung disease) (HCC)  J84.9 Antinuclear Antib (ANA)    Rheumatoid factor    Cyclic citrul peptide antibody, IgG    Sjogrens syndrome-A extractable nuclear antibody    Sjogrens syndrome-B extractable nuclear antibody    Anti-scleroderma antibody    QuantiFERON-TB Gold Plus    CT Chest High Resolution    Sed Rate (ESR)    2. DOE (dyspnea on exertion)  R06.09 Antinuclear Antib (ANA)    Rheumatoid factor    Cyclic citrul peptide antibody, IgG    Sjogrens syndrome-A extractable nuclear antibody    Sjogrens syndrome-B extractable nuclear antibody    Anti-scleroderma antibody    QuantiFERON-TB Gold Plus    CT Chest High Resolution    Sed Rate (ESR)       - I am concerned you  have Interstitial Lung Disease (ILD) aka Pulmonary Fibrosis  - I think you had it this in 2011  based on CT  -  There are MANY varieties of this - To narrow down possibilities and assess severity please do the following  PLAN  - take ILD questionnaire packet with you and bring it back NEXT TIME  - do overnight oxygen test on room air  - do High Resolution CT chest wo contrast - supine and prone, inspiratory and expiratory images (only Dr Fredirick Lathe or Dr Llana Aliment or Dr Ashley Murrain or  Dr Jayme Cloud or Dr Dorothey Baseman to read)  - do following blood work - autoimmune panel: Serum: ESR, ANA, DS-DNA, RF, anti-CCP, ssA, ssB, scl-70,  - do serum Hypersensitivity Pneumonitis Panel   - do blood  Quantiferon Gold   - HOLD OFF PFT   Followup  - DR Rramaswamy 30 min visit in 6-8 weeks    FOLLOWUP Return in about 7 weeks (around 02/25/2024) for 30 min visit, with Dr Marchelle Gearing, Face to Face Visit afte testing.    SIGNATURE    Dr. Kalman Shan, M.D., F.C.C.P,  Pulmonary and Critical Care  Medicine Staff Physician, [facility] Director - Interstitial Lung Disease  Program  Pulmonary Fibrosis [facility] [place], [place], [address]  Pager: [phone], If no answer or between  15:00h - 7:00h: call [phone] Telephone: [phone]  12:32 PM 01/07/2024

## 2024-01-07 NOTE — Patient Instructions (Addendum)
 ICD-10-CM   1. ILD (interstitial lung disease) (HCC)  J84.9 Antinuclear Antib (ANA)    Rheumatoid factor    Cyclic citrul peptide antibody, IgG    Sjogrens syndrome-A extractable nuclear antibody    Sjogrens syndrome-B extractable nuclear antibody    Anti-scleroderma antibody    QuantiFERON-TB Gold Plus    CT Chest High Resolution    Sed Rate (ESR)    2. DOE (dyspnea on exertion)  R06.09 Antinuclear Antib (ANA)    Rheumatoid factor    Cyclic citrul peptide antibody, IgG    Sjogrens syndrome-A extractable nuclear antibody    Sjogrens syndrome-B extractable nuclear antibody    Anti-scleroderma antibody    QuantiFERON-TB Gold Plus    CT Chest High Resolution    Sed Rate (ESR)       - I am concerned you  have Interstitial Lung Disease (ILD) aka Pulmonary Fibrosis  - I think you had it this in 2011  based on CT  -  There are MANY varieties of this - To narrow down possibilities and assess severity please do the following  PLAN  - take ILD questionnaire packet with you and bring it back NEXT TIME  - do overnight oxygen test on room air  - do High Resolution CT chest wo contrast - supine and prone, inspiratory and expiratory images (only Dr Fredirick Lathe or Dr Llana Aliment or Dr Ashley Murrain or Dr Jayme Cloud or Dr Dorothey Baseman to read)  - do following blood work - autoimmune panel: Serum: ESR, ANA, DS-DNA, RF, anti-CCP, ssA, ssB, scl-70,  - do serum Hypersensitivity Pneumonitis Panel   - do blood  Quantiferon Gold   - HOLD OFF PFT   Followup  - DR Rramaswamy 30 min visit in 6-8 weeks

## 2024-01-09 LAB — QUANTIFERON-TB GOLD PLUS
Mitogen-NIL: 8.89 [IU]/mL
NIL: 0.03 [IU]/mL
QuantiFERON-TB Gold Plus: NEGATIVE
TB1-NIL: 0 [IU]/mL
TB2-NIL: 0 [IU]/mL

## 2024-01-10 ENCOUNTER — Encounter: Payer: Self-pay | Admitting: Licensed Clinical Social Worker

## 2024-01-14 LAB — HYPERSENSITIVITY PNUEMONITIS PROFILE
ASPERGILLUS FUMIGATUS: NEGATIVE
Faenia retivirgula: NEGATIVE
Pigeon Serum: NEGATIVE
S. VIRIDIS: NEGATIVE
T. CANDIDUS: NEGATIVE
T. VULGARIS: NEGATIVE

## 2024-01-14 LAB — ANTI-NUCLEAR AB-TITER (ANA TITER): ANA Titer 1: 1:40 {titer} — ABNORMAL HIGH

## 2024-01-14 LAB — SJOGRENS SYNDROME-A EXTRACTABLE NUCLEAR ANTIBODY: SSA (Ro) (ENA) Antibody, IgG: <1 AI

## 2024-01-14 LAB — CYCLIC CITRUL PEPTIDE ANTIBODY, IGG: Cyclic Citrullin Peptide Ab: <16 U

## 2024-01-14 LAB — RHEUMATOID FACTOR: Rheumatoid fact SerPl-aCnc: 20 [IU]/mL — ABNORMAL HIGH (ref <14)

## 2024-01-14 LAB — ANA: Anti Nuclear Antibody (ANA): POSITIVE — AB

## 2024-01-14 LAB — SJOGRENS SYNDROME-B EXTRACTABLE NUCLEAR ANTIBODY: SSB (La) (ENA) Antibody, IgG: <1 AI

## 2024-01-14 LAB — ANTI-SCLERODERMA ANTIBODY: Scleroderma (Scl-70) (ENA) Antibody, IgG: <1 AI

## 2024-01-17 ENCOUNTER — Other Ambulatory Visit: Payer: Self-pay | Admitting: Family Medicine

## 2024-01-17 NOTE — Telephone Encounter (Signed)
 Sent. Thanks.

## 2024-01-21 ENCOUNTER — Other Ambulatory Visit: Payer: Self-pay

## 2024-01-21 ENCOUNTER — Ambulatory Visit: Payer: Self-pay

## 2024-01-21 ENCOUNTER — Encounter

## 2024-01-21 NOTE — Patient Outreach (Signed)
 Complex Care Management   Visit Note  01/21/2024  Name:  Ashlee Warren MRN: [id number] DOB: [birth year]  Situation: Referral received for Complex Care Management related to  ILD  I obtained verbal consent from patient and daughter/ Ashlee Warren.  Visit completed with patient and daughter Ashlee Warren  on the phone  Background:   Past Medical History:  Diagnosis Date   Atrial fibrillation [facility])    Cataract    Dysrhythmia    afib   Essential hypertension     Assessment: Patient Reported Symptoms:  Cognitive Other:  Neurological      HEENT Other: patient wears hearing aids.  Cardiovascular No symptoms reported    Respiratory Shortness of breath (daughter states much better now.)    Endocrine No symptoms reported    Gastrointestinal No symptoms reported    Genitourinary No symptoms reported    Integumentary      Musculoskeletal No symptoms reported    Psychosocial Other ongoing grief regarding husband death 3 years ago. Refuses counseling. Good family support. Enjoys church family   There were no vitals filed for this visit.  Medications Reviewed Today     Reviewed by Zenith Lamphier E, RN (Registered Nurse) on 01/21/24 at 1057  Med List Status: <None>   Medication Order Taking? Sig Documenting Provider Last Dose Status Informant  acetaminophen (TYLENOL) 500 MG tablet [id number] Yes Take 1-2 tablets (500-1,000 mg total) by mouth every 8 (eight) hours as needed. Donnie Galea, MD Taking Active   diclofenac Sodium (VOLTAREN) 1 % GEL [id number] Yes Apply 2 g topically 4 (four) times daily as needed. Donnie Galea, MD Taking Active   Multiple Vitamins-Minerals (PRESERVISION AREDS 2+MULTI VIT PO) [id number] Yes Take 1 capsule by mouth in the morning and at bedtime.  [provider] Taking Active Multiple Informants           Med Note Harman Lightning Oct 19, 2021 12:51 PM)    Rivaroxaban (XARELTO) 15 MG TABS tablet [id number] Yes TAKE 1 TABLET (15 MG  TOTAL) BY MOUTH DAILY WITH SUPPER Donnie Galea, MD Taking Active   TIADYLT ER 120 MG 24 hr capsule [id number] Yes Take 120 mg by mouth daily. [provider] Taking Active             Recommendation:   PCP Follow-up  Follow Up Plan:   Telephone follow-up in 1 month with RN case manager  Verba Girt RN, BSN, CCM [place]  [facility], Population Health Case Manager Phone: [phone]

## 2024-01-23 ENCOUNTER — Ambulatory Visit: Payer: Self-pay | Admitting: Licensed Clinical Social Worker

## 2024-01-23 NOTE — Patient Instructions (Signed)
 Visit Information  Thank you for taking time to visit with me today. Please don't hesitate to contact me if I can be of assistance to you before our next scheduled appointment.  Our next appointment is by telephone on 06/05 at 10 AM Please call the care guide team at [phone] if you need to cancel or reschedule your appointment.   Following is a copy of your care plan:   Goals Addressed             This Visit's Progress    LCSW VBCI Social Work Care Plan   On track    Problems:   Level of care concerns and Memory Deficits  CSW Clinical Goal(s):   Over the next 90 days the Caregiver will work with Child psychotherapist to address concerns related to Caregiver Stress .  Interventions:  Level of Care Concerns in a patient with  Memory Concerns Current level of care: home, alone Evaluation of patient's unmet needs in current living environment ADL's Assessed needs, level of care concerns, how currently meeting needs and barriers to care Discussed private pay options for personal care needs (Caregiver has secured private aid to assist pt with ADLs five days a week) Solution-Focused Strategies employed: Strategies to assist with caregiver stress discussed  Facility  Patient is not interested in placement, resides independently in home  Advance Directive Active listening / Reflection utilized  Caregiver stress acknowledged :  Discussed caregiver resources and support:    Patient Goals/Self-Care Activities:  Continue taking your medication as prescribed.   Increase coping skills and healthy habits  Plan:   Telephone follow up appointment with care management team member scheduled for:  4-6 weeks     COMPLETED: Obtain Supportive Resources-In Home Support       Activities and task to complete in order to accomplish goals.   Keep all upcoming appointments discussed today Continue with compliance of taking medication prescribed by Doctor Implement healthy coping skills  discussed to assist with management of symptoms         Please call the Suicide and Crisis Lifeline: 988 call 911 if you are experiencing a Mental Health or Behavioral Health Crisis or need someone to talk to.  Patient verbalizes understanding of instructions and care plan provided today and agrees to view in MyChart. Active MyChart status and patient understanding of how to access instructions and care plan via MyChart confirmed with patient.     Windy Fast [facility]  [facility], [facility] Clinical Social Worker Direct Dial: [phone]  Fax: [phone] Website: [email or web address] 12:18 PM

## 2024-01-23 NOTE — Patient Outreach (Signed)
 Complex Care Management   Visit Note  01/23/2024  Name:  Ashlee Warren MRN: [id number] DOB: 04-Oct-1928  Situation: Referral received for Complex Care Management related to  ILD  I obtained verbal consent from patient.  Visit completed with patient and daughter/ dpr Leonard Downing the phone  Background:   Past Medical History:  Diagnosis Date   Atrial fibrillation [facility])    Cataract    Dysrhythmia    afib   Essential hypertension     Assessment: Patient Reported Symptoms:  Cognitive Other:  Neurological      HEENT Other: patient wears hearing aids.  Cardiovascular No symptoms reported    Respiratory Shortness of breath (daughter states much better now.)    Endocrine No symptoms reported    Gastrointestinal No symptoms reported    Genitourinary No symptoms reported    Integumentary      Musculoskeletal No symptoms reported    Psychosocial Other ongoing grief regarding husband death 3 years ago. Refuses counseling. Good family support. Enjoys church family   There were no vitals filed for this visit.  Medications Reviewed Today     Reviewed by Otho Ket, RN (Registered Nurse) on 01/21/24 at 1057  Med List Status: <None>   Medication Order Taking? Sig Documenting Provider Last Dose Status Informant  acetaminophen (TYLENOL) 500 MG tablet [id number] Yes Take 1-2 tablets (500-1,000 mg total) by mouth every 8 (eight) hours as needed. Joaquim Nam, MD Taking Active   diclofenac Sodium (VOLTAREN) 1 % GEL [id number] Yes Apply 2 g topically 4 (four) times daily as needed. Joaquim Nam, MD Taking Active   Multiple Vitamins-Minerals (PRESERVISION AREDS 2+MULTI VIT PO) [id number] Yes Take 1 capsule by mouth in the morning and at bedtime.  [provider] Taking Active Multiple Informants           Med Note Nanetta Batty Oct 19, 2021 12:51 PM)    Rivaroxaban (XARELTO) 15 MG TABS tablet [id number] Yes TAKE 1 TABLET (15 MG TOTAL) BY MOUTH DAILY WITH  SUPPER Joaquim Nam, MD Taking Active   TIADYLT ER 120 MG 24 hr capsule [id number] Yes Take 120 mg by mouth daily. [provider] Taking Active             Recommendation:   Ongoing follow up with RN case manager for education, management, and support.   Follow Up Plan:   Telephone follow up appointment date/time:  02/19/2024 at 2 pm  George Ina RN, BSN, CCM [place]  [facility], Population Health Case Manager Phone: [phone]

## 2024-01-29 ENCOUNTER — Ambulatory Visit
Admission: RE | Admit: 2024-01-29 | Discharge: 2024-01-29 | Disposition: A | Discharge status: Home / Self Care | Source: Ambulatory Visit | Attending: Internal Medicine | Admitting: Internal Medicine

## 2024-01-29 DIAGNOSIS — R0609 Other forms of dyspnea: Secondary | ICD-10-CM

## 2024-01-29 DIAGNOSIS — J8410 Pulmonary fibrosis, unspecified: Secondary | ICD-10-CM | POA: Diagnosis not present

## 2024-01-29 DIAGNOSIS — I700 Atherosclerosis of aorta: Secondary | ICD-10-CM | POA: Diagnosis not present

## 2024-01-29 DIAGNOSIS — I2510 Atherosclerotic heart disease of native coronary artery without angina pectoris: Secondary | ICD-10-CM | POA: Diagnosis not present

## 2024-01-29 DIAGNOSIS — R911 Solitary pulmonary nodule: Secondary | ICD-10-CM | POA: Diagnosis not present

## 2024-01-29 DIAGNOSIS — J849 Interstitial pulmonary disease, unspecified: Secondary | ICD-10-CM

## 2024-01-29 NOTE — Patient Instructions (Signed)
 Visit Information  Thank you for taking time to visit with me today. Please don't hesitate to contact me if I can be of assistance to you before our next scheduled appointment.  Our next appointment is by telephone on 02/19/24 at 2 pm Please call the care guide team at [phone] if you need to cancel or reschedule your appointment.   Following is a copy of your care plan:   Goals Addressed             This Visit's Progress    VBCI RN Care Plan- ILD       Problems:  Chronic Disease Management support and education needs related to ILD / change in memory  Goal: Over the next 3 months the Patient will attend all scheduled medical appointments: to provider as evidenced by patient report/ chart review.         continue to work with Medical illustrator and/or Social Worker to address care management and care coordination needs related to ILD as evidenced by adherence to CM Team Scheduled appointments     demonstrate Ongoing health management independence as evidenced by patient report/ chart review.         take all medications exactly as prescribed and will call provider for medication related questions as evidenced by patient report/ chart review.      Interventions:   Evaluation of current treatment plan related to  ILD ,  self-management and patient's adherence to plan as established by provider. Discussed plans with patient for ongoing care management follow up and provided patient with direct contact information for care management team Evaluation of current treatment plan related to ILD and patient's adherence to plan as established by provider Reviewed medications with patient and discussed importance of adherence Reviewed scheduled/upcoming provider appointments including   Discussed plans with patient for ongoing care management follow up and provided patient with direct contact information for care management team Advised to avoid triggers such as pollen, dust, allergens. Advised  to take medications as prescribed. Advised to notify provider of worsening symptoms and / or call 911 for severe symptoms.   Patient Self-Care Activities:  Attend all scheduled provider appointments Call pharmacy for medication refills 3-7 days in advance of running out of medications Call provider office for new concerns or questions  Take medications as prescribed   Call 911 for severe symptoms Avoid triggers:  pollen, dust, allergens  Plan:  Telephone follow up appointment with care management team member scheduled for:  02/19/2024 at 2pm             Please call the Suicide and Crisis Lifeline: 988 call 1-800-273-TALK (toll free, 24 hour hotline) if you are experiencing a Mental Health or Behavioral Health Crisis or need someone to talk to.  Patient verbalizes understanding of instructions and care plan provided today and agrees to view in MyChart. Active MyChart status and patient understanding of how to access instructions and care plan via MyChart confirmed with patient.     Verba Girt RN, BSN, CCM [employer], Population Health Case Manager Phone: [phone]

## 2024-02-03 DIAGNOSIS — R0902 Hypoxemia: Secondary | ICD-10-CM | POA: Diagnosis not present

## 2024-02-03 DIAGNOSIS — G4730 Sleep apnea, unspecified: Secondary | ICD-10-CM | POA: Diagnosis not present

## 2024-02-11 ENCOUNTER — Ambulatory Visit (INDEPENDENT_AMBULATORY_CARE_PROVIDER_SITE_OTHER): Payer: Medicare Other

## 2024-02-11 DIAGNOSIS — I442 Atrioventricular block, complete: Secondary | ICD-10-CM

## 2024-02-11 LAB — CUP PACEART REMOTE DEVICE CHECK
Battery Remaining Longevity: 132 mo
Battery Voltage: 3.02 V
Brady Statistic RV Percent Paced: 99.46 %
Date Time Interrogation Session: 20250429025917
Implantable Lead Connection Status: 753985
Implantable Lead Implant Date: 20221028
Implantable Lead Location: 753860
Implantable Lead Model: 1948
Implantable Pulse Generator Implant Date: 20221028
Lead Channel Impedance Value: 437 Ohm
Lead Channel Impedance Value: 570 Ohm
Lead Channel Pacing Threshold Amplitude: 0.75 V
Lead Channel Pacing Threshold Pulse Width: 0.4 ms
Lead Channel Sensing Intrinsic Amplitude: 9.75 mV
Lead Channel Sensing Intrinsic Amplitude: 9.75 mV
Lead Channel Setting Pacing Amplitude: 2 V
Lead Channel Setting Pacing Pulse Width: 0.4 ms
Lead Channel Setting Sensing Sensitivity: 0.9 mV
Zone Setting Status: 755011

## 2024-02-19 ENCOUNTER — Other Ambulatory Visit: Payer: Self-pay

## 2024-02-19 NOTE — Patient Outreach (Signed)
 Complex Care Management   Visit Note  02/19/2024  Name:  Ashlee Warren MRN: [id number] DOB: 02/25/1928  Situation: Referral received for Complex Care Management related to  interstitial lung disease  I obtained verbal consent from Patient.  Visit completed with patient  on the phone  Background:   Past Medical History:  Diagnosis Date   Atrial fibrillation (HCC)    Cataract    Dysrhythmia    afib   Essential hypertension     Assessment: Patient Reported Symptoms:  Cognitive Cognitive Status: Alert and oriented to person, place, and time, Insightful and able to interpret abstract concepts, Normal speech and language skills Cognitive/Intellectual Conditions Management [RPT]: None reported or documented in medical history or problem list   Health Maintenance Behaviors: Annual physical exam  Neurological Neurological Review of Symptoms: No symptoms reported    HEENT HEENT Symptoms Reported: No symptoms reported      Cardiovascular Cardiovascular Symptoms Reported: No symptoms reported Does patient have uncontrolled Hypertension?: No Cardiovascular Conditions: Hypertension (atrial fibrillation) Cardiovascular Management Strategies: Routine screening, Medication therapy Weight: 116 lb (52.6 kg) Cardiovascular Self-Management Outcome: 4 (good) Cardiovascular Comment: per chart review next cardiology follow up appointment 05/12/24  Respiratory Respiratory Symptoms Reported: Shortness of breath Additional Respiratory Details: patient states she has mild SOB with exertion. Respiratory Conditions:  (interstitial lung disease) Respiratory Comment: patient reports having an oxygen test at [facility]. She states she has not heard the results of this.  Per chart review patient has follow up visit with pulmonologist on 03/13/24  Endocrine Patient reports the following symptoms related to hypoglycemia or hyperglycemia : No symptoms reported Is patient diabetic?: No    Gastrointestinal Additional  Gastrointestinal Details: patient reports stools are small ball shaped and slimmy. Denies abdominal pain or nausea/ vomiting.  Patient states her appetite is fair however does not eat alot. Gastrointestinal Self-Management Outcome: 3 (uncertain) Gastrointestinal Comment: patient states she submitted a stool sample within the past few months and is concerned that she has not heard the full results. She states she was told the sample was abnormal but no further explanation.    Genitourinary Genitourinary Symptoms Reported: No symptoms reported    Integumentary Integumentary Symptoms Reported: No symptoms reported    Musculoskeletal Musculoskelatal Symptoms Reviewed: No symptoms reported   Falls in the past year?: No Number of falls in past year: 1 or less Was there an injury with Fall?: No Fall Risk Category Calculator: 0 Patient Fall Risk Level: Low Fall Risk    Psychosocial Psychosocial Symptoms Reported: No symptoms reported            02/19/2024    2:50 PM  Depression screen PHQ 2/9  Decreased Interest 0  Down, Depressed, Hopeless 0  PHQ - 2 Score 0    There were no vitals filed for this visit.  Medications Reviewed Today     Reviewed by Medina Degraffenreid E, RN (Registered Nurse) on 02/19/24 at 1504  Med List Status: <None>   Medication Order Taking? Sig Documenting Provider Last Dose Status Informant  acetaminophen  (TYLENOL ) 500 MG tablet [id number] No Take 1-2 tablets (500-1,000 mg total) by mouth every 8 (eight) hours as needed. Donnie Galea, MD Taking Active   diclofenac  Sodium (VOLTAREN ) 1 % GEL [id number] No Apply 2 g topically 4 (four) times daily as needed. Donnie Galea, MD Taking Active   Multiple Vitamins-Minerals (PRESERVISION AREDS 2+MULTI VIT PO) [id number] No Take 1 capsule by mouth in the morning and at bedtime.   Patient not  taking: Reported on 01/23/2024   [provider] Not Taking Active Multiple Informants           Med Note Harman Lightning Oct 19, 2021 12:51 PM)    Rivaroxaban  (XARELTO ) 15 MG TABS tablet [id number] No TAKE 1 TABLET (15 MG TOTAL) BY MOUTH DAILY WITH SUPPER Donnie Galea, MD Taking Active   TIADYLT  ER 120 MG 24 hr capsule [id number] No Take 120 mg by mouth daily. [provider] Taking Active             Recommendation:   PCP Follow-up  Follow Up Plan:   Telephone follow-up in 1 month with RN case manager   Verba Girt RN, BSN, CCM [place]  [facility], Population Health Case Manager Phone: [phone]

## 2024-02-19 NOTE — Patient Instructions (Signed)
 Visit Information  Thank you for taking time to visit with me today. Please don't hesitate to contact me if I can be of assistance to you before our next scheduled appointment.  Your next care management appointment is by telephone on 03/20/24 at 10 am  Your next appointment with the pulmonologist in 03/13/24 at 11:30 am  Please call the care guide team at [phone] if you need to cancel, schedule, or reschedule an appointment.   Please call the Suicide and Crisis Lifeline: 988 call 1-800-273-TALK (toll free, 24 hour hotline) if you are experiencing a Mental Health or Behavioral Health Crisis or need someone to talk to.  Verba Girt RN, BSN, CCM [employer], Population Health Case Manager Phone: [phone]

## 2024-03-12 NOTE — Patient Instructions (Signed)
 ICD-10-CM   1. ILD (interstitial lung disease) (HCC)  J84.9 Antinuclear Antib (ANA)    Rheumatoid factor    Cyclic citrul peptide antibody, IgG    Sjogrens syndrome-A extractable nuclear antibody    Sjogrens syndrome-B extractable nuclear antibody    Anti-scleroderma antibody    QuantiFERON-TB Gold Plus    CT Chest High Resolution    Sed Rate (ESR)    2. DOE (dyspnea on exertion)  R06.09 Antinuclear Antib (ANA)    Rheumatoid factor    Cyclic citrul peptide antibody, IgG    Sjogrens syndrome-A extractable nuclear antibody    Sjogrens syndrome-B extractable nuclear antibody    Anti-scleroderma antibody    QuantiFERON-TB Gold Plus    CT Chest High Resolution    Sed Rate (ESR)       - I am concerned you  have Interstitial Lung Disease (ILD) aka Pulmonary Fibrosis  - I think you had it this in 2011  based on CT  -  There are MANY varieties of this - To narrow down possibilities and assess severity please do the following  PLAN  - take ILD questionnaire packet with you and bring it back NEXT TIME  - do overnight oxygen test on room air  - do High Resolution CT chest wo contrast - supine and prone, inspiratory and expiratory images (only Dr Kareen Osier or Dr Alfonse Angle or Dr Debara Faden or Dr Bevelyn Bryant or Dr Gradie Lawless to read)  - do following blood work - autoimmune panel: Serum: ESR, ANA, DS-DNA, RF, anti-CCP, ssA, ssB, scl-70,  - do serum Hypersensitivity Pneumonitis Panel   - do blood  Quantiferon Gold   - HOLD OFF PFT   Followup  - DR Rramaswamy 30 min visit in 6-8 weeks

## 2024-03-12 NOTE — Progress Notes (Unsigned)
 OV 01/07/2024  Subjective:  Patient ID: Ashlee Warren, female , DOB: [birth year] , age 89 y.o. , MRN: [id number] , ADDRESS: [address] [place] [address] PCP Donnie Galea, MD Patient Care Team: Donnie Galea, MD as PCP - General (Family Medicine) Loyde Rule, MD as PCP - Cardiology (Cardiology) [facility] Lewis, Jasmine D, LCSW as [facility] Care Management (Licensed Clinical Social Worker) Green, Davina E, RN as [facility] Care Management  This Provider for this visit: Treatment Team:  Attending Provider: Maire Scot, MD    01/07/2024 -   Chief Complaint  Patient presents with   Pulmonary Consult    Referred by Dr Letvak for possible ILD. She states usually every winter she gets a bad cold, but has not had one this year. She has occ SOB- just comes out of the blue.      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  Also former patient of Dr. Loetta Ringer back in 2006.  She said she also used to work for him.  She broke into a big smile when she heard of Dr. Loetta Ringer and the fact that he is now also doing research and pulmonary.  She is here with her daughter.  They report that for the last several weeks or maybe even more she has been having some chronic diarrhea that is undergoing workup.  During this time she reported being short of breath.  She states it is very random and there is no clear-cut pattern.  I tried my best to elicit her dyspnea history more specifically but could not.  Does not much of a cough or wheezing.  Chest x-ray was done and showed ILD which I personally visualized and agreed with that this was early March 2025.  She wanted to know more about this.  I have visualized old images and I did find a CT angiogram chest from 11 years ago that showed presence of ILD.  She was not aware of it back then.  Was done to rule out pulmonary embolism.  She states that dyspnea is mild.  Exercise hypoxemia test today was like normal.   She is is wondering if a CT scan is really needed and how extensive the workup needs to be because she has a good quality of life and she really says she is not bothered by shortness of breath.  I did indicate to her that pulmonary fibrosis can be progressive and much of this is about future.  Therefore she is willing to undergo a limited workup.      OV 03/13/2024  Subjective:  Patient ID: Ashlee Warren, female , DOB: [birth year] , age 89 y.o. , MRN: [id number] , ADDRESS: [address] [place]  [id number] PCP Donnie Galea, MD Patient Care Team: Donnie Galea, MD as PCP - General (Family Medicine) Loyde Rule, MD as PCP - Cardiology (Cardiology) [facility] Lewis, Jasmine D, LCSW as [facility] Care Management (Licensed Clinical Social Worker) Green, Davina E, RN as [facility] Care Management  This Provider for this visit: Treatment Team:  Attending Provider: Maire Scot, MD    03/13/2024 -   Chief Complaint  Patient presents with   Follow-up    Asymptomatic     HPI Ashlee Warren 89 y.o. -soon-to-be 89 years old.  ILD workup in progress.  Presents with her daughter.  She is a retired Engineer, civil (consulting).  She is able to give a lot of history  and asked pertinent questions but periodically that her memory lapses.  In any event she and her daughter report overall stability.  The diarrhea at last visit has resolved.  She has some shortness of breath while doing yard work.  Otherwise she is stable.  We have had a pulmonary function test because of technical challenges that she might have.  We did do a high-resolution CT chest and impression showed that she does have ILD.  I personally think this NSIP pattern/indeterminate for UIP.  Slightly worse than 11 years ago.  Previously she had a CT scan in 2013.  I personally visualized these and showed it to her.  Her overnight pulse oximetry is normal.  Her ANA is trace positive ESR slightly high.  I told the family and her  that this is probably consistent with age.  Given age 89, memory issues and frailty and current antifibrotic's having a lot of systemic side effects [although reversible] and only very mild progression over a decade I have advised expectant follow-up.  And supportive care     SYMPTOM SCALE - ILD 01/07/2024  Current weight   O2 use ra  Shortness of Breath 0 -> 5 scale with 5 being worst (score 6 If unable to do)  At rest 1  Simple tasks - showers, clothes change, eating, shaving 0  Household (dishes, doing bed, laundry) 0  Shopping 0  Walking level at own pace 1  Walking up Stairs 0  Total (30-36) Dyspnea Score 2  How bad is your cough? 0.5  How bad is your fatigue 1  How bad is nausea 0  How bad is vomiting?  0  How bad is diarrhea? 3  How bad is anxiety? 2  How bad is depression 2  Any chronic pain - if so where and how bad 0        Simple office walk 224 (66+46 x 2) feet Pod A at [facility] x  3 laps goal with forehead probe 01/07/2024 Overnight pulse oximetry 02/03/2024: Time spent less than or equal to 88% for 1 minute 47 seconds.   O2 used ra   Number laps completed Walked x 3 laps   Comments about pace modrate   Resting Pulse Ox/HR 98% and 72/min   Final Pulse Ox/HR 99% and 92/min   Desaturated </= 88% no   Desaturated <= 3% points no   Got Tachycardic >/= 90/min yes   Symptoms at end of test No dyspnea   Miscellaneous comments x    CT Chest data from date: *  Narrative & Impression  CLINICAL DATA:  Diffuse/interstitial lung disease.   EXAM: CT CHEST WITHOUT CONTRAST   TECHNIQUE: Multidetector CT imaging of the chest was performed following the standard protocol without intravenous contrast. High resolution imaging of the lungs, as well as inspiratory and expiratory imaging, was performed.   RADIATION DOSE REDUCTION: This exam was performed according to the departmental dose-optimization program which includes automated exposure control, adjustment of  the mA and/or kV according to patient size and/or use of iterative reconstruction technique.   COMPARISON:  CT abdomen pelvis 08/16/2020 and CT chest 09/29/2012.   FINDINGS: Cardiovascular: Atherosclerotic calcification of the aorta, aortic valve and coronary arteries. Enlarged pulmonic trunk and heart. No pericardial effusion.   Mediastinum/Nodes: No pathologically enlarged mediastinal or axillary lymph nodes. Hilar regions are difficult to definitively evaluate without IV contrast. Esophagus is grossly unremarkable.   Lungs/Pleura: Peripheral and basilar predominant subpleural reticulation, coarsened ground-glass and traction bronchiectasis/bronchiolectasis.  There may be single layer honeycombing. Findings appear minimally progressive from 09/29/2012. 7 mm subpleural nodule in the posterior right lower lobe (9/109), new from 08/16/2020. Trace left pleural fluid. Airway is unremarkable. Minimal air trapping.   Upper Abdomen: 6 mm left renal stone. Small low-attenuation lesion in the left kidney. No specific follow-up necessary. Visualized portions of the liver, gallbladder, adrenal glands, kidneys, spleen, pancreas, stomach and bowel are otherwise grossly unremarkable. No upper abdominal adenopathy.   Musculoskeletal: Osteopenia. Minimal degenerative change in the spine.   IMPRESSION: 1. Pulmonary parenchymal pattern of fibrosis, as detailed above, minimally progressive from 09/29/2012. Findings are categorized as probable UIP per consensus guidelines: Diagnosis of Idiopathic Pulmonary Fibrosis: An Official ATS/ERS/JRS/ALAT Clinical Practice Guideline. Am Annie Barton Crit Care Med Vol 198, Iss 5, [phone], Jun 15 2017. 2. Trace left pleural fluid. 3. 7 mm subpleural posterior right lower lobe nodule, new from 08/16/2020. No specific follow-up recommended in a patient of this age. 4. 6 mm left renal stone. 5. Aortic atherosclerosis (ICD10-I70.0). Coronary  artery calcification. 6. Enlarged pulmonic trunk, indicative of pulmonary arterial hypertension.     Electronically Signed   By: Shearon Denis M.D.   On: 02/10/2024 10:52    PFT      No data to display           Latest Reference Range & Units 01/07/24 10:52  Sed Rate 0 - 30 mm/hr 44 (H)  Aspergillus Fumigatus NEGATIVE  NEGATIVE  Pigeon Serum NEGATIVE  NEGATIVE  Anti Nuclear Antibody (ANA) NEGATIVE  POSITIVE !  ANA Pattern 1  Nuclear, Homogeneous !  ANA Titer 1 titer 1:40 (H)  Cyclic Citrullin Peptide Ab UNITS <16  RA Latex Turbid. <14 IU/mL 20 (H)  SSA (Ro) (ENA) Antibody, IgG <1.0 NEG AI <1.0 NEG  SSB (La) (ENA) Antibody, IgG <1.0 NEG AI <1.0 NEG  Scleroderma (Scl-70) (ENA) Antibody, IgG <1.0 NEG AI <1.0 NEG  (H): Data is abnormally high !: Data is abnormal   LAB RESULTS last 96 hours No results found.       has a past medical history of Atrial fibrillation (HCC), Cataract, Dysrhythmia, and Essential hypertension.   reports that she has never smoked. She has never been exposed to tobacco smoke. She has never used smokeless tobacco.  Past Surgical History:  Procedure Laterality Date   BREAST BIOPSY     left   CARDIOVERSION N/A 03/11/2013   Procedure: CARDIOVERSION;  Surgeon: Loyde Rule, MD;  Location: [facility] ENDOSCOPY;  Service: Cardiovascular;  Laterality: N/A;   CATARACT EXTRACTION, BILATERAL  2000   CYSTOSCOPY/URETEROSCOPY/HOLMIUM LASER/STENT PLACEMENT Right 09/16/2020   Procedure: CYSTOSCOPY, RETROGRADE PYELOGRAM, URETEROSCOPY, STENT PLACEMENT;  Surgeon: Lahoma Pigg, MD;  Location: [facility] ORS;  Service: Urology;  Laterality: Right;  ONLY NEEDS 45 MIN   EYE SURGERY     HERNIA REPAIR     KIDNEY STONE SURGERY     PACEMAKER IMPLANT N/A 08/11/2021   Procedure: PACEMAKER IMPLANT;  Surgeon: Verona Goodwill, MD;  Location: [facility] INVASIVE CV LAB;  Service: Cardiovascular;  Laterality: N/A;   TONSILLECTOMY      Allergies  Allergen Reactions   Penicillins Rash    Sulfonamide Derivatives Rash   Tamsulosin Swelling    Swollen Lips    Immunization History  Administered Date(s) Administered   Fluad Quad(high Dose 65+) 09/01/2020   Influenza, High Dose Seasonal PF 07/28/2021, 07/12/2022, 07/23/2023   Influenza,inj,Quad PF,6+ Mos 10/25/2015, 07/19/2016, 07/24/2017, 08/07/2018, 06/05/2019   Influenza-Unspecified 07/28/2021   PFIZER(Purple Top)SARS-COV-2  Vaccination 12/10/2019, 01/05/2020    Family History  Problem Relation Age of Onset   Cancer Mother        leukemia   Cancer Sister      Current Outpatient Medications:    acetaminophen  (TYLENOL ) 500 MG tablet, Take 1-2 tablets (500-1,000 mg total) by mouth every 8 (eight) hours as needed., Disp: , Rfl:    diclofenac  Sodium (VOLTAREN ) 1 % GEL, Apply 2 g topically 4 (four) times daily as needed., Disp: 100 g, Rfl: 3   Multiple Vitamins-Minerals (PRESERVISION AREDS 2+MULTI VIT PO), Take 1 capsule by mouth in the morning and at bedtime., Disp: , Rfl:    Rivaroxaban  (XARELTO ) 15 MG TABS tablet, TAKE 1 TABLET (15 MG TOTAL) BY MOUTH DAILY WITH SUPPER, Disp: 90 tablet, Rfl: 3   TIADYLT  ER 120 MG 24 hr capsule, Take 120 mg by mouth daily., Disp: , Rfl:       Objective:   Vitals:   03/13/24 1124 03/13/24 1129  BP: (!) 160/81 (!) 155/83  Pulse: 72   SpO2: 98%   Weight: 118 lb 3.2 oz (53.6 kg)   Height: 5\' 2"  (1.575 m)     Estimated body mass index is 21.62 kg/m as calculated from the following:   Height as of this encounter: 5\' 2"  (1.575 m).   Weight as of this encounter: 118 lb 3.2 oz (53.6 kg).  @WEIGHTCHANGE @  Filed Weights   03/13/24 1124  Weight: 118 lb 3.2 oz (53.6 kg)     Physical Exam   General: No distress. lookswell O2 at rest: no Cane present: no Sitting in wheel chair: no Frail: yes Obese: no Neuro: Alert and Oriented x 3. GCS 15. Speech normal Psych: Pleasant Resp:  Barrel Chest - no.  Wheeze - no, Crackles - no, No overt respiratory distress CVS: Normal heart  sounds. Murmurs - Ext: Stigmata of Connective Tissue Disease - no HEENT: Normal upper airway. PEERL +. No post nasal drip        Assessment:       ICD-10-CM   1. ILD (interstitial lung disease) (HCC)  J84.9      Given age 39, memory issues and frailty and current antifibrotic's having a lot of systemic side effects [although reversible] and only very mild progression over a decade I have advised expectant follow-up.  And supportive care.  They are in agreement    Plan:     Patient Instructions     ICD-10-CM   1. ILD (interstitial lung disease) (HCC)  J84.9         - You Do HAVE have Interstitial Lung Disease (ILD) aka Pulmonary Fibrosis  -speicif type is NSIP mostl likey  - I think you had it this in 2011  based on CT - > worse but still very mild and not affecting Quality of Life - Autoimmune test trace positive is what  I consider normal for age  PLAN - 65 moint visit with symptom socre and walk test in 6 months with APP  - recommend anti fibotics only if there is faster progression  - hold off PFT for now     FOLLOWUP Return in about 6 months (around 09/13/2024) for with any of the APPS.  (Level 04 E&M 2024: Estb >= 30 min n  visit type: on-site physical face to visit visit spent in total care time and counseling or/and coordination of care by this undersigned MD - Dr Maire Scot. This includes one or more of the following on  this same day 03/13/2024: pre-charting, chart review, note writing, documentation discussion of test results, diagnostic or treatment recommendations, prognosis, risks and benefits of management options, instructions, education, compliance or risk-factor reduction. It excludes time spent by the CMA or office staff in the care of the patient . Actual time is 30 min)   SIGNATURE    Dr. Maire Scot, M.D., F.C.C.P,  Pulmonary and Critical Care Medicine Staff Physician, [facility] Director - Interstitial Lung Disease   Program  Pulmonary Fibrosis [facility] Network at [facility] [place], [place], [address]  Pager: [phone], If no answer or between  15:00h - 7:00h: call [phone] Telephone: [phone]  3:45 PM 03/13/2024

## 2024-03-13 ENCOUNTER — Encounter: Payer: Self-pay | Admitting: Internal Medicine

## 2024-03-13 ENCOUNTER — Ambulatory Visit: Admitting: Internal Medicine

## 2024-03-13 VITALS — BP 155/83 | HR 72 | Ht 62.0 in | Wt 118.2 lb

## 2024-03-13 DIAGNOSIS — J849 Interstitial pulmonary disease, unspecified: Secondary | ICD-10-CM

## 2024-03-19 ENCOUNTER — Other Ambulatory Visit: Payer: Self-pay | Admitting: Licensed Clinical Social Worker

## 2024-03-19 NOTE — Patient Outreach (Signed)
 Complex Care Management   Visit Note  03/19/2024  Name:  Ashlee Warren MRN: [id number] DOB: 04/07/1928  Situation: Referral received for Complex Care Management related to Caregiver Stress I obtained verbal consent from Caregiver.  Visit completed with Ashlee Warren, pts daughter  on the phone  Background:   Past Medical History:  Diagnosis Date   Atrial fibrillation [facility])    Cataract    Dysrhythmia    afib   Essential hypertension     Assessment: Patient Reported Symptoms:  Cognitive Cognitive Status: Normal speech and language skills, Able to follow simple commands Cognitive/Intellectual Conditions Management [RPT]: None reported or documented in medical history or problem list   Health Maintenance Behaviors: Annual physical exam  Neurological Neurological Review of Symptoms: No symptoms reported    HEENT HEENT Symptoms Reported: No symptoms reported      Cardiovascular Cardiovascular Symptoms Reported: No symptoms reported Does patient have uncontrolled Hypertension?: No Cardiovascular Conditions: Hypertension Cardiovascular Management Strategies: Routine screening, Medication therapy  Respiratory Respiratory Symptoms Reported: No symptoms reported Additional Respiratory Details: Patient recently completed an appt with Pulmonology on 03/13/24    Endocrine Patient reports the following symptoms related to hypoglycemia or hyperglycemia : No symptoms reported Is patient diabetic?: No    Gastrointestinal Gastrointestinal Symptoms Reported: No symptoms reported      Genitourinary Genitourinary Symptoms Reported: No symptoms reported    Integumentary Integumentary Symptoms Reported: No symptoms reported    Musculoskeletal Musculoskelatal Symptoms Reviewed: No symptoms reported        Psychosocial Psychosocial Symptoms Reported: No symptoms reported Additional Psychological Details: Pt's daughter reports pt is irritable; however, states it's pt's baseline. She has established  healthy boundaries and has strong support, including an aid that visits five days a week Behavioral Management Strategies: Coping strategies, Adequate rest, Support system Major Change/Loss/Stressor/Fears (CP): Medical condition, self Quality of Family Relationships: helpful, involved, supportive Do you feel physically threatened by others?: No      02/19/2024    2:50 PM  Depression screen PHQ 2/9  Decreased Interest 0  Down, Depressed, Hopeless 0  PHQ - 2 Score 0    There were no vitals filed for this visit.  Medications Reviewed Today   Medications were not reviewed in this encounter     Recommendation:   PCP Follow-up Continue Current Plan of Care  Follow Up Plan:   Closing From:  Complex Care Management  Alease Hunter, LCSW [place]  [facility], [facility] Clinical Social Worker Direct Dial: [phone]  Fax: [phone] Website: [email or web address] 11:00 AM

## 2024-03-19 NOTE — Patient Instructions (Signed)
 Visit Information  Thank you for taking time to visit with me today. Please don't hesitate to contact me if I can be of assistance to you before our next scheduled appointment.   Closing From: Complex Care Management.  Please call the care guide team at [phone] if you need to cancel, schedule, or reschedule an appointment.   Please call the Suicide and Crisis Lifeline: 988 call the [place]  National Suicide Prevention Lifeline: (228) 515-9619 or TTY: 931-474-6478 TTY 7044257104) to talk to a trained counselor call 911 if you are experiencing a Mental Health or Behavioral Health Crisis or need someone to talk to.  Alease Hunter, LCSW [place]  [facility], [facility] Clinical Social Worker Direct Dial: [phone]  Fax: [phone] Website: [email or web address] 11:01 AM

## 2024-03-20 ENCOUNTER — Telehealth: Payer: Self-pay

## 2024-03-27 NOTE — Progress Notes (Signed)
 Remote pacemaker transmission.

## 2024-04-15 ENCOUNTER — Other Ambulatory Visit: Payer: Self-pay

## 2024-04-15 NOTE — Patient Instructions (Signed)
 Visit Information  Thank you for taking time to visit with me today. Please don't hesitate to contact me if I can be of assistance to you before our next scheduled appointment.  Your next care management appointment is by telephone on 05/13/2024 at 3:00 pm  Telephone follow-up in 1 month  Please call the care guide team at [phone] if you need to cancel, schedule, or reschedule an appointment.   Please call the Suicide and Crisis Lifeline: 988 call the [place]  National Suicide Prevention Lifeline: 225-882-6482 or TTY: 513-582-2455 TTY (424) 152-5726) to talk to a trained counselor call 1-800-273-TALK (toll free, 24 hour hotline) go to [facility] [address], [place] [phone]) call 911 if you are experiencing a Mental Health or Behavioral Health Crisis or need someone to talk to.  [name], MSN, RN [place]  [facility], [facility] RN Care Manager Direct Dial: [phone] Fax: [phone]    Fall Prevention in the Home, Adult Falls can cause injuries and can happen to people of all ages. There are many things you can do to make your home safer and to help prevent falls. What actions can I take to prevent falls? General information Use good lighting in all rooms. Make sure to: Replace any light bulbs that burn out. Turn on the lights in dark areas and use night-lights. Keep items that you use often in easy-to-reach places. Lower the shelves around your home if needed. Move furniture so that there are clear paths around it. Do not use throw rugs or other things on the floor that can make you trip. If any of your floors are uneven, fix them. Add color or contrast paint or tape to clearly mark and help you see: Grab bars or handrails. First and last steps of staircases. Where the edge of each step is. If you use a ladder or stepladder: Make sure that it is fully opened. Do not climb a closed  ladder. Make sure the sides of the ladder are locked in place. Have someone hold the ladder while you use it. Know where your pets are as you move through your home. What can I do in the bathroom?     Keep the floor dry. Clean up any water on the floor right away. Remove soap buildup in the bathtub or shower. Buildup makes bathtubs and showers slippery. Use non-skid mats or decals on the floor of the bathtub or shower. Attach bath mats securely with double-sided, non-slip rug tape. If you need to sit down in the shower, use a non-slip stool. Install grab bars by the toilet and in the bathtub and shower. Do not use towel bars as grab bars. What can I do in the bedroom? Make sure that you have a light by your bed that is easy to reach. Do not use any sheets or blankets on your bed that hang to the floor. Have a firm chair or bench with side arms that you can use for support when you get dressed. What can I do in the kitchen? Clean up any spills right away. If you need to reach something above you, use a step stool with a grab bar. Keep electrical cords out of the way. Do not use floor polish or wax that makes floors slippery. What can I do with my stairs? Do not leave anything on the stairs. Make sure that you have a light switch at the top and the bottom of the stairs. Make sure that there  are handrails on both sides of the stairs. Fix handrails that are broken or loose. Install non-slip stair treads on all your stairs if they do not have carpet. Avoid having throw rugs at the top or bottom of the stairs. Choose a carpet that does not hide the edge of the steps on the stairs. Make sure that the carpet is firmly attached to the stairs. Fix carpet that is loose or worn. What can I do on the outside of my home? Use bright outdoor lighting. Fix the edges of walkways and driveways and fix any cracks. Clear paths of anything that can make you trip, such as tools or rocks. Add color or  contrast paint or tape to clearly mark and help you see anything that might make you trip as you walk through a door, such as a raised step or threshold. Trim any bushes or trees on paths to your home. Check to see if handrails are loose or broken and that both sides of all steps have handrails. Install guardrails along the edges of any raised decks and porches. Have leaves, snow, or ice cleared regularly. Use sand, salt, or ice melter on paths if you live where there is ice and snow during the winter. Clean up any spills in your garage right away. This includes grease or oil spills. What other actions can I take? Review your medicines with your doctor. Some medicines can cause dizziness or changes in blood pressure, which increase your risk of falling. Wear shoes that: Have a low heel. Do not wear high heels. Have rubber bottoms and are closed at the toe. Feel good on your feet and fit well. Use tools that help you move around if needed. These include: Canes. Walkers. Scooters. Crutches. Ask your doctor what else you can do to help prevent falls. This may include seeing a physical therapist to learn to do exercises to move better and get stronger. Where to find more information Centers for Disease Control and Prevention, STEADI: TonerPromos.no [employer] on Aging: BaseRingTones.pl National Institute on Aging: BaseRingTones.pl Contact a doctor if: You are afraid of falling at home. You feel weak, drowsy, or dizzy at home. You fall at home. Get help right away if you: Lose consciousness or have trouble moving after a fall. Have a fall that causes a head injury. These symptoms may be an emergency. Get help right away. Call 911. Do not wait to see if the symptoms will go away. Do not drive yourself to the hospital. This information is not intended to replace advice given to you by your health care provider. Make sure you discuss any questions you have with your health care provider. Document  Revised: 06/04/2022 Document Reviewed: 06/04/2022 Elsevier Patient Education  2024 [employer].

## 2024-04-15 NOTE — Patient Outreach (Signed)
 Complex Care Management   Visit Note  04/15/2024  Name:  Ashlee Warren MRN: [id number] DOB: 03-15-1928  Situation: Referral received for Complex Care Management related to Interstitial lung disease I obtained verbal consent from Patient.  Visit completed with Patient  on the phone  Background:   Past Medical History:  Diagnosis Date   Atrial fibrillation (HCC)    Cataract    Dysrhythmia    afib   Essential hypertension     Assessment: Patient Reported Symptoms:  Cognitive Cognitive Status: Alert and oriented to person, place, and time, Insightful and able to interpret abstract concepts (Stammering, anxious speech at times, states gets confused and recognizes and will call daughter for assistance) Cognitive/Intellectual Conditions Management [RPT]: Other Other: memory issues,   Health Maintenance Behaviors: Annual physical exam, Hobbies, Immunizations, Sleep adequate, Spiritual practice(s), Healthy diet Healing Pattern: Average Health Facilitated by: Rest, Healthy diet  Neurological Neurological Review of Symptoms: No symptoms reported    HEENT HEENT Symptoms Reported: No symptoms reported HEENT Management Strategies: Medical device HEENT Comment: Hearing aides    Cardiovascular Cardiovascular Symptoms Reported: No symptoms reported Does patient have uncontrolled Hypertension?: No Cardiovascular Management Strategies: Routine screening, Medication therapy Cardiovascular Comment: states checks wedding rings for swelling daily  Respiratory Respiratory Symptoms Reported: No symptoms reported    Endocrine Endocrine Symptoms Reported: No symptoms reported Is patient diabetic?: No    Gastrointestinal Gastrointestinal Symptoms Reported: Bleeding Additional Gastrointestinal Details: States occasionally first bit of stool has slight bit of blood then no more - daughter aware and will send info to PCP Gastrointestinal Comment: stools negative    Genitourinary Genitourinary  Symptoms Reported: No symptoms reported    Integumentary Integumentary Symptoms Reported: No symptoms reported    Musculoskeletal Musculoskelatal Symptoms Reviewed: No symptoms reported   Falls in the past year?: No Number of falls in past year: 1 or less Was there an injury with Fall?: No Fall Risk Category Calculator: 0 Patient Fall Risk Level: Low Fall Risk Fall risk Follow up: Falls evaluation completed (Patient carries phone in pocket and in to shower)  Psychosocial Psychosocial Symptoms Reported: No symptoms reported Additional Psychological Details: Patient with stammering speech, sounds anxious at times, frequent apologizing, concern for children and their issues. Reports strong support from family and caregiver Nena. Behavioral Management Strategies: Adequate rest, Support system Behavioral Health Self-Management Outcome: 4 (good) Major Change/Loss/Stressor/Fears (CP): Medical condition, self, Medical condition, family Behaviors When Feeling Stressed/Fearful: Calls daughter Rock when feeling stressed/confused Techniques to [employer] with Loss/Stress/Change: Diversional activities Quality of Family Relationships: helpful, involved, supportive Do you feel physically threatened by others?: No      04/15/2024    3:41 PM  Depression screen PHQ 2/9  Decreased Interest 0  Down, Depressed, Hopeless 0  PHQ - 2 Score 0    Vitals:    Medications Reviewed Today     Reviewed by Devra Lands, RN (Registered Nurse) on 04/15/24 at 1516  Med List Status: <None>   Medication Order Taking? Sig Documenting Provider Last Dose Status Informant  acetaminophen  (TYLENOL ) 500 MG tablet [id number] Yes Take 1-2 tablets (500-1,000 mg total) by mouth every 8 (eight) hours as needed. Cleatus Arlyss RAMAN, MD  Active   diclofenac  Sodium (VOLTAREN ) 1 % GEL [id number] Yes Apply 2 g topically 4 (four) times daily as needed. Cleatus Arlyss RAMAN, MD  Active   Multiple Vitamins-Minerals (PRESERVISION AREDS  2+MULTI VIT PO) [id number] Yes Take 1 capsule by mouth in the morning and at bedtime. [provider]  Active  Multiple Informants           Med Note MAYER ARLYSS GORMAN [place] Oct 19, 2021 12:51 PM)    Rivaroxaban  (XARELTO ) 15 MG TABS tablet [id number] Yes TAKE 1 TABLET (15 MG TOTAL) BY MOUTH DAILY WITH SUPPER Cleatus ARLYSS GORMAN, MD  Active   TIADYLT  ER 120 MG 24 hr capsule [id number] Yes Take 120 mg by mouth daily. [provider]  Active             Recommendation:   PCP Follow-up Continue Current Plan of Care  Follow Up Plan:   Telephone follow-up in 1 month  [name], MSN, RN [facility]  [facility], [facility] Health RN Care Manager Direct Dial: [phone] Fax: [phone]

## 2024-05-03 ENCOUNTER — Telehealth: Payer: Self-pay | Admitting: Family Medicine

## 2024-05-03 NOTE — Telephone Encounter (Signed)
 Please see if patient had any more episodes of BRBPR or black stools.  If so, then needs OV.  Thanks.

## 2024-05-04 NOTE — Telephone Encounter (Signed)
 Spoke with patient and she denied any BRBPR or black stools for the past 2-3 days. She does stack that when she does have a bowel movements that its coming out as small bowels not a full bowel (long) passing. But no blood.

## 2024-05-10 NOTE — Telephone Encounter (Signed)
 If she has persistent sx (stool changes, abd pain, blood in stool, etc), then please offer/encourage OV.  Thanks .

## 2024-05-11 NOTE — Telephone Encounter (Signed)
 Reached out to patient to check on how she is doing. She says it better no blood in stool or pain. I did advise that if she notice any of the symptoms returning to please call and make an appointment. Patient appreciated call and verbalized understanding.

## 2024-05-12 ENCOUNTER — Ambulatory Visit: Payer: Medicare Other

## 2024-05-12 DIAGNOSIS — I442 Atrioventricular block, complete: Secondary | ICD-10-CM | POA: Diagnosis not present

## 2024-05-13 ENCOUNTER — Telehealth: Payer: Self-pay

## 2024-05-13 LAB — CUP PACEART REMOTE DEVICE CHECK
Battery Remaining Longevity: 129 mo
Battery Voltage: 3.02 V
Brady Statistic RV Percent Paced: 99.8 %
Date Time Interrogation Session: 20250728204653
Implantable Lead Connection Status: 753985
Implantable Lead Implant Date: 20221028
Implantable Lead Location: 753860
Implantable Lead Model: 1948
Implantable Pulse Generator Implant Date: 20221028
Lead Channel Impedance Value: 437 Ohm
Lead Channel Impedance Value: 551 Ohm
Lead Channel Pacing Threshold Amplitude: 0.5 V
Lead Channel Pacing Threshold Pulse Width: 0.4 ms
Lead Channel Sensing Intrinsic Amplitude: 12.5 mV
Lead Channel Sensing Intrinsic Amplitude: 12.5 mV
Lead Channel Setting Pacing Amplitude: 2 V
Lead Channel Setting Pacing Pulse Width: 0.4 ms
Lead Channel Setting Sensing Sensitivity: 0.9 mV
Zone Setting Status: 755011

## 2024-05-16 ENCOUNTER — Ambulatory Visit: Payer: Self-pay | Admitting: Cardiology

## 2024-05-18 ENCOUNTER — Other Ambulatory Visit: Payer: Self-pay

## 2024-05-18 NOTE — Patient Instructions (Signed)
 Visit Information  Thank you for taking time to visit with me today. Please don't hesitate to contact me if I can be of assistance to you before our next scheduled appointment.  Your next care management appointment is by telephone on 06/11/24 at 11:30 am  Telephone follow-up in 1 month  Please call the care guide team at [phone] if you need to cancel, schedule, or reschedule an appointment.   Please call the Suicide and Crisis Lifeline: 988 call the [place]  National Suicide Prevention Lifeline: (847)014-8609 or TTY: 5197150254 TTY 202-556-6304) to talk to a trained counselor call 1-800-273-TALK (toll free, 24 hour hotline) go to [facility] [address] [place] [phone]) call 911 if you are experiencing a Mental Health or Behavioral Health Crisis or need someone to talk to.  [name], MSN, RN [facility], [facility] Health RN Care Manager Direct Dial: [phone] Fax: [phone]   Stress, Adult Stress is a normal reaction to life events. Stress is what you feel when life demands more than you are used to, or more than you think you can handle. Some stress can be useful, such as studying for a test or meeting a deadline at work. Stress that occurs too often or for too long can cause problems. Long-lasting stress is called chronic stress. Chronic stress can affect your emotional health and interfere with relationships and normal daily activities. Too much stress can weaken your body's defense system (immune system) and increase your risk for physical illness. If you already have a medical problem, stress can make it worse. What are the causes? All sorts of life events can cause stress. An event that causes stress for one person may not be stressful for someone else. Major life events, whether positive or negative, commonly cause stress. Examples include: Losing a job or starting a new job. Losing a  loved one. Moving to a new town or home. Getting married or divorced. Having a baby. Getting injured or sick. Less obvious life events can also cause stress, especially if they occur day after day or in combination with each other. Examples include: Working long hours. Driving in traffic. Caring for children. Being in debt. Being in a difficult relationship. What are the signs or symptoms? Stress can cause emotional and physical symptoms and can lead to unhealthy behaviors. These include the following: Emotional symptoms Anxiety. This is feeling worried, afraid, on edge, overwhelmed, or out of control. Anger, including irritation or impatience. Depression. This is feeling sad, down, helpless, or guilty. Trouble focusing, remembering, or making decisions. Physical symptoms Aches and pains. These may affect your head, neck, back, stomach, or other areas of your body. Tight muscles or a clenched jaw. Low energy. Trouble sleeping. Unhealthy behaviors Eating to feel better (overeating) or skipping meals. Working too much or putting off tasks. Smoking, drinking alcohol, or using drugs to feel better. How is this diagnosed? A stress disorder is diagnosed through an assessment by your health care provider. A stress disorder may be diagnosed based on: Your symptoms and any stressful life events. Your medical history. Tests to rule out other causes of your symptoms. Depending on your condition, your health care provider may refer you to a specialist for further evaluation. How is this treated?  Stress management techniques are the recommended treatment for stress. Medicine is not typically recommended for treating stress. Techniques to reduce your reaction to stressful life events include: Identifying stress. Monitor yourself for symptoms of stress and notice  what causes stress for you. These skills may help you to avoid or prepare for stressful events. Managing time. Set your priorities,  keep a calendar of events, and learn to say no. These actions can help you avoid taking on too much. Techniques for dealing with stress include: Rethinking the problem. Try to think realistically about stressful events rather than ignoring them or overreacting. Try to find the positives in a stressful situation rather than focusing on the negatives. Exercise. Physical exercise can release both physical and emotional tension. The key is to find a form of exercise that you enjoy and do it regularly. Relaxation techniques. These relax the body and mind. Find one or more that you enjoy and use the techniques regularly. Examples include: Meditation, deep breathing, or progressive relaxation techniques. Yoga or tai chi. Biofeedback, mindfulness techniques, or journaling. Listening to music, being in nature, or taking part in other hobbies. Practicing a healthy lifestyle. Eat a balanced diet, drink plenty of water, limit or avoid caffeine, and get plenty of sleep. Having a strong support network. Spend time with family, friends, or other people you enjoy being around. Express your feelings and talk things over with someone you trust. Counseling or talk therapy with a mental health provider may help if you are having trouble managing stress by yourself. Follow these instructions at home: Lifestyle  Avoid drugs. Do not use any products that contain nicotine or tobacco. These products include cigarettes, chewing tobacco, and vaping devices, such as e-cigarettes. If you need help quitting, ask your health care provider. If you drink alcohol: Limit how much you have to: 0-1 drink a day for women who are not pregnant. 0-2 drinks a day for men. Know how much alcohol is in a drink. In the [place] one drink equals one 12 oz bottle of beer (355 mL), one 5 oz glass of wine (148 mL), or one 1 oz glass of hard liquor (44 mL). Do not use alcohol or drugs to relax. Eat a balanced diet that includes fresh fruits and  vegetables, whole grains, lean meats, fish, eggs, beans, and low-fat dairy. Avoid processed foods and foods high in added fat, sugar, and salt. Exercise at least 30 minutes on 5 or more days each week. Get 7-8 hours of sleep each night. General instructions  Practice stress management techniques as told by your health care provider. Drink enough fluid to keep your urine pale yellow. Take over-the-counter and prescription medicines only as told by your health care provider. Keep all follow-up visits. This is important. Contact a health care provider if: Your symptoms get worse. You have new symptoms. You feel overwhelmed by your problems and can no longer manage them by yourself. Get help right away if: You have thoughts of hurting yourself or others. Get help right awayif you feel like you may hurt yourself or others, or have thoughts about taking your own life. Go to your nearest emergency room or: Call 911. Call the National Suicide Prevention Lifeline at 534-491-7671 or 988. This is open 24 hours a day. Text the Crisis Text Line at 4786096494. Summary Stress is a normal reaction to life events. It can cause problems if it happens too often or for too long. Practicing stress management techniques is the best way to treat stress. Counseling or talk therapy with a mental health provider may help if you are having trouble managing stress by yourself. This information is not intended to replace advice given to you by your health care provider. Make  sure you discuss any questions you have with your health care provider. Document Revised: 05/11/2021 Document Reviewed: 05/11/2021 Elsevier Patient Education  2024 [employer].

## 2024-05-18 NOTE — Patient Outreach (Signed)
 Complex Care Management   Visit Note  05/18/2024  Name:  Ashlee Warren MRN: [id number] DOB: Nov 16, 1927  Situation: Referral received for Complex Care Management related to Interstitial Lung Disease I obtained verbal consent from Patient.  Visit completed with Patient  on the phone  Background:   Past Medical History:  Diagnosis Date   Atrial fibrillation [facility])    Cataract    Dysrhythmia    afib   Essential hypertension     Assessment: Patient Reported Symptoms:  Cognitive Cognitive Status: Alert and oriented to person, place, and time, Insightful and able to interpret abstract concepts Other: memory issues . stammers   Health Maintenance Behaviors: Annual physical exam, Hobbies, Social activities, Healthy diet Healing Pattern: Average Health Facilitated by: Rest, Healthy diet  Neurological Neurological Review of Symptoms: No symptoms reported Neurological Management Strategies: Routine screening  HEENT HEENT Symptoms Reported: No symptoms reported HEENT Management Strategies: Routine screening, Medication therapy, Medical device HEENT Comment: Daughter making regular eye doc appointment    Cardiovascular Does patient have uncontrolled Hypertension?: No Cardiovascular Management Strategies: Routine screening, Medication therapy, Medical device Cardiovascular Comment: Pacer  Respiratory Respiratory Symptoms Reported: Shortness of breath Other Respiratory Symptoms: with activity at times Respiratory Management Strategies: Routine screening  Endocrine Endocrine Symptoms Reported: No symptoms reported Is patient diabetic?: No    Gastrointestinal Gastrointestinal Symptoms Reported: No symptoms reported Additional Gastrointestinal Details: no bleeding      Genitourinary Genitourinary Symptoms Reported: No symptoms reported    Integumentary Integumentary Symptoms Reported: No symptoms reported    Musculoskeletal Musculoskelatal Symptoms Reviewed: Joint pain, Muscle  pain Additional Musculoskeletal Details: using capsacin cream with improvement, RNCM will request Voltaren  refill as discussed Musculoskeletal Management Strategies: Routine screening Falls in the past year?: No Number of falls in past year: 1 or less Was there an injury with Fall?: No Fall Risk Category Calculator: 0 Patient Fall Risk Level: Low Fall Risk Patient at Risk for Falls Due to: Impaired balance/gait Fall risk Follow up: Falls evaluation completed, Falls prevention discussed (Anticoagulent - ED for head bumps)  Psychosocial Additional Psychological Details: patient repeats, stammering anxious speech, some frustration with people not listening to her, telling her what to do, irregular schedule of HA causing stress Behavioral Management Strategies: Support system, Adequate rest Major Change/Loss/Stressor/Fears (CP): Medical condition, family, Medical condition, self Techniques to Cope with Loss/Stress/Change: Diversional activities Quality of Family Relationships: helpful, involved, supportive Do you feel physically threatened by others?: No      05/18/2024   11:51 AM  Depression screen PHQ 2/9  Decreased Interest 0  Down, Depressed, Hopeless 0  PHQ - 2 Score 0    There were no vitals filed for this visit.  Medications Reviewed Today   Medications were not reviewed in this encounter     Recommendation:   PCP Follow-up Continue Current Plan of Care  Follow Up Plan:   Telephone follow-up in 1 month  [name], MSN, RN [facility] Health  [facility], [facility] RN Care Manager Direct Dial: [phone] Fax: [phone]

## 2024-05-24 ENCOUNTER — Other Ambulatory Visit: Payer: Self-pay | Admitting: Family Medicine

## 2024-05-24 MED ORDER — DICLOFENAC SODIUM 1 % EX GEL: 2.0000 g | Freq: Four times a day (QID) | CUTANEOUS | 3 refills | Status: AC | PRN

## 2024-06-02 DIAGNOSIS — L570 Actinic keratosis: Secondary | ICD-10-CM | POA: Diagnosis not present

## 2024-06-02 DIAGNOSIS — X32XXXD Exposure to sunlight, subsequent encounter: Secondary | ICD-10-CM | POA: Diagnosis not present

## 2024-06-02 DIAGNOSIS — Z85828 Personal history of other malignant neoplasm of skin: Secondary | ICD-10-CM | POA: Diagnosis not present

## 2024-06-02 DIAGNOSIS — Z08 Encounter for follow-up examination after completed treatment for malignant neoplasm: Secondary | ICD-10-CM | POA: Diagnosis not present

## 2024-06-04 ENCOUNTER — Other Ambulatory Visit: Payer: Self-pay | Admitting: Internal Medicine

## 2024-06-11 ENCOUNTER — Other Ambulatory Visit: Payer: Self-pay

## 2024-06-11 NOTE — Patient Outreach (Signed)
 Complex Care Management   Visit Note  06/11/2024  Name:  Ashlee Warren MRN: [id number] DOB: 08/05/1928  Situation: Referral received for Complex Care Management related to Interstitial Lung Disease I obtained verbal consent from Patient.  Visit completed with Caregiver Patient  on the phone  Background:   Past Medical History:  Diagnosis Date   Atrial fibrillation (HCC)    Cataract    Dysrhythmia    afib   Essential hypertension     Assessment: Patient Reported Symptoms:  Cognitive Cognitive Status: Alert and oriented to person, place, and time, Normal speech and language skills Cognitive/Intellectual Conditions Management [RPT]: Other Other: very anxious and worried about SIL in critical condition, memory issues   Health Maintenance Behaviors: Annual physical exam, Hobbies, Healthy diet Healing Pattern: Average Health Facilitated by: Rest, Healthy diet  Neurological Neurological Review of Symptoms: No symptoms reported Neurological Management Strategies: Routine screening  HEENT HEENT Symptoms Reported: No symptoms reported HEENT Management Strategies: Routine screening, Medication therapy HEENT Comment: 09/2024 eye doc appointment    Cardiovascular Cardiovascular Symptoms Reported: No symptoms reported Cardiovascular Management Strategies: Routine screening, Medication therapy, Medical device Cardiovascular Comment: Pacer  Respiratory Respiratory Symptoms Reported: Shortness of breath Other Respiratory Symptoms: not recent but occasionally SOB Respiratory Management Strategies: Routine screening  Endocrine Endocrine Symptoms Reported: No symptoms reported Is patient diabetic?: No    Gastrointestinal Additional Gastrointestinal Details: states ready to have the bleeding checked - denies more bleeding, just occasional      Genitourinary Genitourinary Symptoms Reported: No symptoms reported    Integumentary Additional Integumentary Details: dermatology - skin  treatments, no signs of infection    Musculoskeletal Additional Musculoskeletal Details: hip bursitis - pain not affecting gait per patient no cane or walker Musculoskeletal Management Strategies: Routine screening Falls in the past year?: No Number of falls in past year: 1 or less Was there an injury with Fall?: No Fall Risk Category Calculator: 0 Patient Fall Risk Level: Low Fall Risk Patient at Risk for Falls Due to: History of fall(s), Impaired balance/gait, Orthopedic patient Fall risk Follow up: Falls evaluation completed, Falls prevention discussed  Psychosocial Additional Psychological Details: I feel like things begin to gang up - all of family having problems at same time Caregiver not coming regularly - unsure of schedule Behavioral Management Strategies: Support system Major Change/Loss/Stressor/Fears (CP): Medical condition, family, Medical condition, self Techniques to Cope with Loss/Stress/Change: Diversional activities Quality of Family Relationships: helpful, involved, supportive Do you feel physically threatened by others?: No    06/11/2024    PHQ2-9 Depression Screening   Little interest or pleasure in doing things Not at all  Feeling down, depressed, or hopeless Not at all  PHQ-2 - Total Score 0  Trouble falling or staying asleep, or sleeping too much    Feeling tired or having little energy    Poor appetite or overeating     Feeling bad about yourself - or that you are a failure or have let yourself or your family down    Trouble concentrating on things, such as reading the newspaper or watching television    Moving or speaking so slowly that other people could have noticed.  Or the opposite - being so fidgety or restless that you have been moving around a lot more than usual    Thoughts that you would be better off dead, or hurting yourself in some way    PHQ2-9 Total Score    If you checked off any problems, how difficult have these problems  made it for you to  do your work, take care of things at home, or get along with other people    Depression Interventions/Treatment      There were no vitals filed for this visit.  Medications Reviewed Today   Medications were not reviewed in this encounter     Recommendation:   PCP Follow-up Continue Current Plan of Care  Follow Up Plan:   Telephone follow-up in 1 month  [name], MSN, RN [facility] Health  [facility], [facility] Health RN Care Manager Direct Dial: [phone] Fax: [phone]

## 2024-06-11 NOTE — Patient Instructions (Signed)
 Visit Information  Thank you for taking time to visit with me today. Please don't hesitate to contact me if I can be of assistance to you before our next scheduled appointment.  Your next care management appointment is by telephone on 07/09/2024 at 11:30 am  Telephone follow-up in 1 month  Please call the care guide team at [phone] if you need to cancel, schedule, or reschedule an appointment.   Please call the Suicide and Crisis Lifeline: 988 call the [place]  National Suicide Prevention Lifeline: 361-792-2790 or TTY: 325-744-8109 TTY 5100494564) to talk to a trained counselor call 1-800-273-TALK (toll free, 24 hour hotline) go to [facility] [address], [place] [phone]) call 911 if you are experiencing a Mental Health or Behavioral Health Crisis or need someone to talk to.  [name], MSN, RN [place]  [facility], [facility] RN Care Manager Direct Dial: [phone] Fax: [phone]  Gastrointestinal Bleeding Gastrointestinal (GI) bleeding is bleeding anywhere along the digestive tract. The digestive tract carries food from your mouth until it leaves your body as waste, or poop. You may have blood in your poop or have black poop. If you vomit, there may be blood in it too. This condition can be mild, serious, or even life-threatening. If you have a lot of bleeding, you may need to stay in the hospital. What are the causes? This condition may be caused by: Irritation and swelling of the esophagus (esophagitis). The esophagus is part of the body that moves food from your mouth to your stomach. Swollen veins in the butt (hemorrhoids). Tears in the opening of the butt. These are often caused by passing hard poop. Pouches that form on the colon (diverticulosis). Irritation and swelling of pouches that have formed in your colon (diverticulitis). Growths (polyps) or cancer. Irritation of the stomach  lining (gastritis). Sores (ulcers) in the stomach. What increases the risk? You are more likely to develop this condition if: You have a certain type of infection in your stomach called Helicobacter pylori infection. You take certain medicines. You smoke. You drink alcohol. What are the signs or symptoms? Common symptoms of this condition include: Vomiting material that has bright red blood in it. It may look like coffee grounds. Changes in your poop. The poop: May have red blood in it. May be black, look like tar, and smell stronger than normal. May be red. Pain or cramping in the belly (abdomen). How is this treated? Treatment for this condition depends on the cause of the bleeding. For example: Your doctor may treat the bleeding during diagnosis. Common ways of diagnosing this condition is endoscopy or colonoscopy. Medicines can be used to: Help control irritation, swelling, or infection. Reduce acid in your stomach. Certain problems can be treated with: Creams. Medicines that are put in the butt (suppositories). Warm baths. Surgery is sometimes needed. If you lose a lot of blood, you may need a blood transfusion. If there is a lot of bleeding, you will need to stay in the hospital. If bleeding is mild, you may be allowed to go home. Follow these instructions at home:  Take over-the-counter and prescription medicines only as told by your doctor. Eat foods that have a lot of fiber in them. These foods include beans, whole grains, and fresh fruits and vegetables. You can also try eating 1-3 prunes each day. Drink enough fluid to keep your pee (urine) pale yellow. Keep all follow-up visits. Contact a doctor if: Your symptoms do not  get better with treatment. Get help right away if: Your bleeding does not stop. You feel dizzy or you pass out (faint). You feel weak. You have very bad cramps in your back or belly. You pass large clumps of blood (clots) in your poop. Your  symptoms are getting worse. You have chest pain or fast heartbeats. These symptoms may be an emergency. Get help right away. Call 911. Do not wait to see if the symptoms will go away. Do not drive yourself to the hospital. Summary Gastrointestinal (GI) bleeding is bleeding anywhere along the digestive tract. The digestive tract carries food from your mouth until it leaves your body as waste, or poop. This bleeding can be caused by many things. Treatment depends on the cause of the bleeding. Take medicines only as told by your doctor. Keep all follow-up visits. This information is not intended to replace advice given to you by your health care provider. Make sure you discuss any questions you have with your health care provider. Document Revised: 05/05/2021 Document Reviewed: 05/05/2021 Elsevier Patient Education  2024 [employer].

## 2024-06-15 ENCOUNTER — Telehealth: Payer: Self-pay | Admitting: Family Medicine

## 2024-06-15 NOTE — Telephone Encounter (Signed)
 Please check with patient/family about rectal bleeding/stool changes then schedule office visit if she consents for that.  Thanks.

## 2024-06-16 NOTE — Telephone Encounter (Signed)
 Noted. Thanks. I am sorry for her loss.

## 2024-06-16 NOTE — Telephone Encounter (Addendum)
 Called and spoke with patient, she states they have had a death in the family and does not have anyone to bring her to the office right now. She will need to speak with family and find out if/when anyone can bring her. She last saw any rectal bleeding and clumpy stools 2 days ago, denies any abdominal cramping and pain. Patient will contact office immediately if she experiences any more rectal bleeding and will be in touch when she finds out when someone can bring her.

## 2024-07-07 DIAGNOSIS — Z23 Encounter for immunization: Secondary | ICD-10-CM | POA: Diagnosis not present

## 2024-07-09 ENCOUNTER — Telehealth

## 2024-07-15 NOTE — Progress Notes (Signed)
 Remote PPM Transmission

## 2024-07-27 ENCOUNTER — Other Ambulatory Visit: Payer: Self-pay

## 2024-07-27 NOTE — Patient Outreach (Signed)
 Complex Care Management   Visit Note  07/27/2024  Name:  Eileene Kisling MRN: [id number] DOB: 1928-03-29  Situation: Referral received for Complex Care Management related to Interstitial Lung Disease I obtained verbal consent from Patient.  Visit completed with Patient  on the phone  Background:   Past Medical History:  Diagnosis Date   Atrial fibrillation (HCC)    Cataract    Dysrhythmia    afib   Essential hypertension     Assessment: Patient Reported Symptoms:  Cognitive Cognitive Status:  (continues to be very anxious - stutter/trembly voice,) Cognitive/Intellectual Conditions Management [RPT]: None reported or documented in medical history or problem list   Health Maintenance Behaviors: Annual physical exam, Hobbies, Social activities, Healthy diet Health Facilitated by: Healthy diet  Neurological Neurological Review of Symptoms: No symptoms reported Neurological Management Strategies: Routine screening  HEENT HEENT Symptoms Reported: No symptoms reported HEENT Management Strategies: Routine screening, Medication therapy    Cardiovascular Cardiovascular Symptoms Reported: No symptoms reported Cardiovascular Management Strategies: Routine screening, Medication therapy, Medical device  Respiratory Respiratory Symptoms Reported: Shortness of breath Other Respiratory Symptoms: occasional DOE relieved with rest Respiratory Management Strategies: Routine screening  Endocrine Endocrine Symptoms Reported: Not assessed Is patient diabetic?: No    Gastrointestinal Gastrointestinal Symptoms Reported: Bleeding Additional Gastrointestinal Details: reports bleeding noted on paper at times when wiping - bright red blood, small amount, none on stool or in bowl, always associated with BM, no discomfort of/ near rectum Gastrointestinal Comment: reports appetite stable    Genitourinary Genitourinary Symptoms Reported: No symptoms reported Genitourinary Comment: reports hydrating  adequtely  Integumentary Integumentary Symptoms Reported: No symptoms reported Skin Management Strategies: Routine screening  Musculoskeletal Musculoskelatal Symptoms Reviewed: Limited mobility Additional Musculoskeletal Details: Right hip bursitis, voltaren  gel and tylenol  prn, no cane/walker, no falls Musculoskeletal Management Strategies: Routine screening Falls in the past year?: No Number of falls in past year: 1 or less Was there an injury with Fall?: No Fall Risk Category Calculator: 0 Patient Fall Risk Level: Low Fall Risk Patient at Risk for Falls Due to: History of fall(s), Orthopedic patient, Impaired mobility Fall risk Follow up: Falls evaluation completed, Falls prevention discussed  Psychosocial Psychosocial Symptoms Reported: No symptoms reported Additional Psychological Details: daughter's ex-husband recently died, reports mood pretty good, feels lonely in house at times but feels safe Behavioral Management Strategies: Support system Major Change/Loss/Stressor/Fears (CP): Medical condition, self Techniques to Cope with Loss/Stress/Change: Diversional activities      07/27/2024    PHQ2-9 Depression Screening   Little interest or pleasure in doing things Not at all  Feeling down, depressed, or hopeless Not at all  PHQ-2 - Total Score 0  Trouble falling or staying asleep, or sleeping too much    Feeling tired or having little energy    Poor appetite or overeating     Feeling bad about yourself - or that you are a failure or have let yourself or your family down    Trouble concentrating on things, such as reading the newspaper or watching television    Moving or speaking so slowly that other people could have noticed.  Or the opposite - being so fidgety or restless that you have been moving around a lot more than usual    Thoughts that you would be better off dead, or hurting yourself in some way    PHQ2-9 Total Score    If you checked off any problems, how difficult  have these problems made it for you to do your work,  take care of things at home, or get along with other people    Depression Interventions/Treatment      There were no vitals filed for this visit.  Medications Reviewed Today     Reviewed by Devra Lands, RN (Registered Nurse) on 07/27/24 at 1314  Med List Status: <None>   Medication Order Taking? Sig Documenting Provider Last Dose Status Informant  acetaminophen  (TYLENOL ) 500 MG tablet [id number] Yes Take 1-2 tablets (500-1,000 mg total) by mouth every 8 (eight) hours as needed. Cleatus Arlyss RAMAN, MD  Active   diclofenac  Sodium (VOLTAREN ) 1 % GEL [id number] Yes Apply 2 g topically 4 (four) times daily as needed. Cleatus Arlyss RAMAN, MD  Active   Multiple Vitamins-Minerals (PRESERVISION AREDS 2+MULTI VIT PO) [id number] Yes Take 1 capsule by mouth in the morning and at bedtime. [provider]  Active Multiple Informants           Med Note MAYER ARLYSS RAMAN [place] Oct 19, 2021 12:51 PM)    Rivaroxaban  (XARELTO ) 15 MG TABS tablet [id number] Yes TAKE 1 TABLET (15 MG TOTAL) BY MOUTH DAILY WITH SUPPER Cleatus Arlyss RAMAN, MD  Active   TIADYLT  ER 120 MG 24 hr capsule [id number] Yes TAKE 1 CAPSULE BY MOUTH EVERY DAY Fernande Elspeth BROCKS, MD  Active             Recommendation:   PCP Follow-up Continue Current Plan of Care  Follow Up Plan:   Telephone follow-up in 1 month  Lands Devra, MSN, RN [facility]  [facility], [facility] RN Care Manager Direct Dial: [phone] Fax: [phone]

## 2024-07-27 NOTE — Patient Instructions (Signed)
 Visit Information  Thank you for taking time to visit with me today. Please don't hesitate to contact me if I can be of assistance to you before our next scheduled appointment.  Your next care management appointment is by telephone on 08/24/2024 at 1:00 pm  Telephone follow-up in 1 month  Please call the care guide team at [phone] if you need to cancel, schedule, or reschedule an appointment.   Please call the Suicide and Crisis Lifeline: 988 call the [place]  National Suicide Prevention Lifeline: 414-077-3365 or TTY: 847-820-6656 TTY 548 172 1061) to talk to a trained counselor call 1-800-273-TALK (toll free, 24 hour hotline) go to [facility] [address] [place] [phone]) call 911 if you are experiencing a Mental Health or Behavioral Health Crisis or need someone to talk to.  [name], MSN, RN [facility], [facility] RN Care Manager Direct Dial: [phone] Fax: [phone]

## 2024-08-11 ENCOUNTER — Ambulatory Visit: Payer: Medicare Other

## 2024-08-11 DIAGNOSIS — I442 Atrioventricular block, complete: Secondary | ICD-10-CM | POA: Diagnosis not present

## 2024-08-12 LAB — CUP PACEART REMOTE DEVICE CHECK
Battery Remaining Longevity: 126 mo
Battery Voltage: 3.01 V
Brady Statistic RV Percent Paced: 99.58 %
Date Time Interrogation Session: 20251028022320
Implantable Lead Connection Status: 753985
Implantable Lead Implant Date: 20221028
Implantable Lead Location: 753860
Implantable Lead Model: 1948
Implantable Pulse Generator Implant Date: 20221028
Lead Channel Impedance Value: 437 Ohm
Lead Channel Impedance Value: 570 Ohm
Lead Channel Pacing Threshold Amplitude: 0.625 V
Lead Channel Pacing Threshold Pulse Width: 0.4 ms
Lead Channel Sensing Intrinsic Amplitude: 12.5 mV
Lead Channel Sensing Intrinsic Amplitude: 12.5 mV
Lead Channel Setting Pacing Amplitude: 2 V
Lead Channel Setting Pacing Pulse Width: 0.4 ms
Lead Channel Setting Sensing Sensitivity: 0.9 mV
Zone Setting Status: 755011

## 2024-08-14 ENCOUNTER — Ambulatory Visit: Payer: Self-pay | Admitting: Cardiology

## 2024-08-18 NOTE — Progress Notes (Signed)
 Remote PPM Transmission

## 2024-08-24 ENCOUNTER — Other Ambulatory Visit: Payer: Self-pay

## 2024-08-24 NOTE — Patient Outreach (Signed)
 Complex Care Management   Visit Note  08/24/2024  Name:  Ashlee Warren MRN: [id number] DOB: 1928-08-27  Situation: Referral received for Complex Care Management related to Lung Disease I obtained verbal consent from Patient.  Visit completed with Patient  on the phone  Background:   Past Medical History:  Diagnosis Date   Atrial fibrillation (HCC)    Cataract    Dysrhythmia    afib   Essential hypertension     Assessment: Patient Reported Symptoms:  Cognitive Cognitive Status: No symptoms reported Cognitive/Intellectual Conditions Management [RPT]: None reported or documented in medical history or problem list Other: remains very anxious, family busy, states one daughter in and out of hospital, stammer/stutter   Health Maintenance Behaviors: Annual physical exam, Social activities, Healthy diet Health Facilitated by: Healthy diet  Neurological Neurological Review of Symptoms: No symptoms reported Neurological Management Strategies: Routine screening  HEENT HEENT Symptoms Reported: No symptoms reported HEENT Management Strategies: Routine screening    Cardiovascular Cardiovascular Symptoms Reported: No symptoms reported Does patient have uncontrolled Hypertension?: No Cardiovascular Management Strategies: Routine screening, Medication therapy, Medical device Cardiovascular Comment: Pacer  Respiratory Respiratory Symptoms Reported: No symptoms reported Respiratory Management Strategies: Routine screening  Endocrine Endocrine Symptoms Reported: Not assessed Is patient diabetic?: No    Gastrointestinal Additional Gastrointestinal Details: no further bleeding      Genitourinary Genitourinary Symptoms Reported: No symptoms reported    Integumentary Integumentary Symptoms Reported: No symptoms reported Skin Management Strategies: Routine screening  Musculoskeletal Musculoskelatal Symptoms Reviewed: No symptoms reported Additional Musculoskeletal Details: tylenol /voltaren   prn for hip pain, no falls, no cane/walker Musculoskeletal Management Strategies: Routine screening Falls in the past year?: No Number of falls in past year: 1 or less Was there an injury with Fall?: No Fall Risk Category Calculator: 0 Patient Fall Risk Level: Low Fall Risk Patient at Risk for Falls Due to: Orthopedic patient, Impaired mobility Fall risk Follow up: Falls evaluation completed, Falls prevention discussed  Psychosocial Psychosocial Symptoms Reported: No symptoms reported Additional Psychological Details: anxious, stammers, reports multiple people assist her - take her out, family busy and describes upset about this at times, states daughter tells her she needs a nerve pill but she says she is fine, keeps busy, social Behavioral Management Strategies: Support system, Activity Techniques to [employer] with Loss/Stress/Change: Diversional activities Quality of Family Relationships: helpful, involved, supportive Do you feel physically threatened by others?: No    08/24/2024    PHQ2-9 Depression Screening   Little interest or pleasure in doing things Not at all  Feeling down, depressed, or hopeless Not at all  PHQ-2 - Total Score 0  Trouble falling or staying asleep, or sleeping too much    Feeling tired or having little energy    Poor appetite or overeating     Feeling bad about yourself - or that you are a failure or have let yourself or your family down    Trouble concentrating on things, such as reading the newspaper or watching television    Moving or speaking so slowly that other people could have noticed.  Or the opposite - being so fidgety or restless that you have been moving around a lot more than usual    Thoughts that you would be better off dead, or hurting yourself in some way    PHQ2-9 Total Score    If you checked off any problems, how difficult have these problems made it for you to do your work, take care of things at home, or get  along with other people     Depression Interventions/Treatment      There were no vitals filed for this visit.  Medications Reviewed Today     Reviewed by Devra Lands, RN (Registered Nurse) on 08/24/24 at 1310  Med List Status: <None>   Medication Order Taking? Sig Documenting Provider Last Dose Status Informant  acetaminophen  (TYLENOL ) 500 MG tablet [id number] Yes Take 1-2 tablets (500-1,000 mg total) by mouth every 8 (eight) hours as needed. Cleatus Arlyss RAMAN, MD  Active   diclofenac  Sodium (VOLTAREN ) 1 % GEL [id number] Yes Apply 2 g topically 4 (four) times daily as needed. Cleatus Arlyss RAMAN, MD  Active   Multiple Vitamins-Minerals (PRESERVISION AREDS 2+MULTI VIT PO) [id number] Yes Take 1 capsule by mouth in the morning and at bedtime. [provider]  Active Multiple Informants           Med Note MAYER ARLYSS RAMAN [place] Oct 19, 2021 12:51 PM)    Rivaroxaban  (XARELTO ) 15 MG TABS tablet [id number] Yes TAKE 1 TABLET (15 MG TOTAL) BY MOUTH DAILY WITH SUPPER Cleatus Arlyss RAMAN, MD  Active   TIADYLT  ER 120 MG 24 hr capsule [id number] Yes TAKE 1 CAPSULE BY MOUTH EVERY DAY Fernande Elspeth BROCKS, MD  Active             Recommendation:   PCP Follow-up Continue Current Plan of Care  Follow Up Plan:   Telephone follow-up in 1 month  Lands Devra, MSN, RN [facility]  [facility], [facility] RN Care Manager Direct Dial: [phone] Fax: [phone]

## 2024-08-24 NOTE — Patient Instructions (Signed)
 Visit Information  Thank you for taking time to visit with me today. Please don't hesitate to contact me if I can be of assistance to you before our next scheduled appointment.  Your next care management appointment is by telephone on 09/17/2024 at 1:00 pm  Telephone follow-up in 1 month  Please call the care guide team at [phone] if you need to cancel, schedule, or reschedule an appointment.   Please call the Suicide and Crisis Lifeline: 988 call the [place]  National Suicide Prevention Lifeline: (954)611-2440 or TTY: 951-153-7929 TTY 934-582-4190) to talk to a trained counselor call 1-800-273-TALK (toll free, 24 hour hotline) go to [facility] [address] [place] [phone]) call 911 if you are experiencing a Mental Health or Behavioral Health Crisis or need someone to talk to.  [name], MSN, RN [facility], [facility] RN Care Manager Direct Dial: [phone] Fax: [phone]

## 2024-09-17 ENCOUNTER — Other Ambulatory Visit: Payer: Self-pay

## 2024-09-17 ENCOUNTER — Telehealth: Payer: Self-pay

## 2024-09-17 NOTE — Patient Instructions (Signed)
 Visit Information  Thank you for taking time to visit with me today. Please don't hesitate to contact me if I can be of assistance to you before our next scheduled appointment.  Your next care management appointment is by telephone on 10/17/23 at 9:00am  Telephone follow-up in 1 month  Please call the care guide team at [phone] if you need to cancel, schedule, or reschedule an appointment.   Please call the Suicide and Crisis Lifeline: 988 call the [place]  National Suicide Prevention Lifeline: 405-202-6705 or TTY: (504)180-7629 TTY 501-512-8874) to talk to a trained counselor call 1-800-273-TALK (toll free, 24 hour hotline) if you are experiencing a Mental Health or Behavioral Health Crisis or need someone to talk to.  Hillarie Harrigan RN RN Care Manager [facility] [phone]

## 2024-09-17 NOTE — Patient Outreach (Signed)
 Complex Care Management   Visit Note  09/17/2024  Name:  Ashlee Warren MRN: [id number] DOB: 05-Oct-1928  Situation: Referral received for Complex Care Management related to HTN, Interstitial lung disease I obtained verbal consent from Patient.  Visit completed with Patient  on the phone  Background:   Past Medical History:  Diagnosis Date   Atrial fibrillation (HCC)    Cataract    Dysrhythmia    afib   Essential hypertension     Assessment: Patient Reported Symptoms:  Cognitive Cognitive Status: No symptoms reported Other: stutters a bit   Health Maintenance Behaviors: None Healing Pattern: Average Health Facilitated by: Healthy diet  Neurological   Neurological Management Strategies: Adequate rest, Routine screening Neurological Self-Management Outcome: 4 (good)  HEENT HEENT Symptoms Reported: No symptoms reported HEENT Management Strategies: Adequate rest, Routine screening HEENT Self-Management Outcome: 4 (good)    Cardiovascular Cardiovascular Symptoms Reported: No symptoms reported Does patient have uncontrolled Hypertension?: No Cardiovascular Management Strategies: Routine screening Cardiovascular Self-Management Outcome: 4 (good) Cardiovascular Comment: pacer  Respiratory Respiratory Symptoms Reported: Shortness of breath Other Respiratory Symptoms: occassional Respiratory Management Strategies: Routine screening Respiratory Self-Management Outcome: 4 (good)  Endocrine Endocrine Symptoms Reported: No symptoms reported Is patient diabetic?: No Endocrine Self-Management Outcome: 4 (good)  Gastrointestinal Gastrointestinal Symptoms Reported: No symptoms reported Additional Gastrointestinal Details: bleeding has resolved since last visit Gastrointestinal Management Strategies: Adequate rest Gastrointestinal Self-Management Outcome: 4 (good)    Genitourinary Genitourinary Symptoms Reported: No symptoms reported Genitourinary Self-Management Outcome: 4 (good)   Integumentary Integumentary Symptoms Reported: Rash Additional Integumentary Details: uses lotion Skin Management Strategies: Routine screening Skin Self-Management Outcome: 4 (good)  Musculoskeletal Musculoskelatal Symptoms Reviewed: No symptoms reported Musculoskeletal Management Strategies: Routine screening Musculoskeletal Self-Management Outcome: 4 (good) Falls in the past year?: No Number of falls in past year: 1 or less Was there an injury with Fall?: No Fall Risk Category Calculator: 0 Patient Fall Risk Level: Low Fall Risk Patient at Risk for Falls Due to: No Fall Risks Fall risk Follow up: Falls evaluation completed, Education provided, Falls prevention discussed, Follow up appointment  Psychosocial Psychosocial Symptoms Reported: No symptoms reported Additional Psychological Details: reports no anxiety Behavioral Health Self-Management Outcome: 4 (good) Major Change/Loss/Stressor/Fears (CP): Denies Techniques to Cope with Loss/Stress/Change: Diversional activities Quality of Family Relationships: involved, helpful, supportive Do you feel physically threatened by others?: No    09/17/2024    PHQ2-9 Depression Screening   Little interest or pleasure in doing things Not at all  Feeling down, depressed, or hopeless Not at all  PHQ-2 - Total Score 0  Trouble falling or staying asleep, or sleeping too much    Feeling tired or having little energy    Poor appetite or overeating     Feeling bad about yourself - or that you are a failure or have let yourself or your family down    Trouble concentrating on things, such as reading the newspaper or watching television    Moving or speaking so slowly that other people could have noticed.  Or the opposite - being so fidgety or restless that you have been moving around a lot more than usual    Thoughts that you would be better off dead, or hurting yourself in some way    PHQ2-9 Total Score    If you checked off any problems, how  difficult have these problems made it for you to do your work, take care of things at home, or get along with other people    Depression  Interventions/Treatment      There were no vitals filed for this visit. Pain Scale: 0-10 Pain Score: 0-No pain Multiple Pain Sites: No  Medications Reviewed Today     Reviewed by Nivia Batten , RN (Registered Nurse) on 09/17/24 at 1307  Med List Status: <None>   Medication Order Taking? Sig Documenting Provider Last Dose Status Informant  acetaminophen  (TYLENOL ) 500 MG tablet [id number] Yes Take 1-2 tablets (500-1,000 mg total) by mouth every 8 (eight) hours as needed. Cleatus Arlyss RAMAN, MD  Active   diclofenac  Sodium (VOLTAREN ) 1 % GEL [id number] Yes Apply 2 g topically 4 (four) times daily as needed. Cleatus Arlyss RAMAN, MD  Active   Multiple Vitamins-Minerals (PRESERVISION AREDS 2+MULTI VIT PO) [id number] Yes Take 1 capsule by mouth in the morning and at bedtime. [provider]  Active Multiple Informants           Med Note MAYER ARLYSS RAMAN [place] Oct 19, 2021 12:51 PM)    Rivaroxaban  (XARELTO ) 15 MG TABS tablet [id number] Yes TAKE 1 TABLET (15 MG TOTAL) BY MOUTH DAILY WITH SUPPER Cleatus Arlyss RAMAN, MD  Active   TIADYLT  ER 120 MG 24 hr capsule [id number] Yes TAKE 1 CAPSULE BY MOUTH EVERY DAY Fernande Elspeth BROCKS, MD  Active             Recommendation:   Continue Current Plan of Care  Follow Up Plan:   Telephone follow up appointment date/time:  10/17/23 at 9:00am  Chrisanne Loose RN RN Care Manager [facility] [phone]

## 2024-09-25 DIAGNOSIS — H02831 Dermatochalasis of right upper eyelid: Secondary | ICD-10-CM | POA: Diagnosis not present

## 2024-09-25 DIAGNOSIS — H02834 Dermatochalasis of left upper eyelid: Secondary | ICD-10-CM | POA: Diagnosis not present

## 2024-09-25 DIAGNOSIS — H04123 Dry eye syndrome of bilateral lacrimal glands: Secondary | ICD-10-CM | POA: Diagnosis not present

## 2024-09-25 DIAGNOSIS — D3131 Benign neoplasm of right choroid: Secondary | ICD-10-CM | POA: Diagnosis not present

## 2024-09-25 DIAGNOSIS — H353131 Nonexudative age-related macular degeneration, bilateral, early dry stage: Secondary | ICD-10-CM | POA: Diagnosis not present

## 2024-09-25 DIAGNOSIS — Z961 Presence of intraocular lens: Secondary | ICD-10-CM | POA: Diagnosis not present

## 2024-10-16 ENCOUNTER — Other Ambulatory Visit: Payer: Self-pay

## 2024-10-16 DIAGNOSIS — F411 Generalized anxiety disorder: Secondary | ICD-10-CM

## 2024-10-16 NOTE — Patient Outreach (Signed)
 Complex Care Management   Visit Note  10/16/2024  Name:  Ashlee Warren MRN: [id number] DOB: September 19, 1928  Situation: Referral received for Complex Care Management related to Lung Disease and Anxiety I obtained verbal consent from Patient.  Visit completed with Caregiver Patient Separate calls  on the phone  Background:   Past Medical History:  Diagnosis Date   Atrial fibrillation (HCC)    Cataract    Dysrhythmia    afib   Essential hypertension     Assessment: Patient Reported Symptoms:  Cognitive Cognitive Status: Difficulties with attention and concentration, Other: (repeating, off topic, very anxious/worried) Cognitive/Intellectual Conditions Management [RPT]: None reported or documented in medical history or problem list   Health Maintenance Behaviors: None  Neurological Neurological Review of Symptoms: Not assessed    HEENT HEENT Symptoms Reported: Not assessed      Cardiovascular Cardiovascular Symptoms Reported: No symptoms reported    Respiratory Respiratory Symptoms Reported: Shortness of breath Other Respiratory Symptoms: noted increased breathing rate with increased anxiety during call, states some SOB with activity but unchanged Respiratory Management Strategies: Routine screening  Endocrine Endocrine Symptoms Reported: Not assessed    Gastrointestinal Gastrointestinal Symptoms Reported: Not assessed      Genitourinary Genitourinary Symptoms Reported: Not assessed    Integumentary Integumentary Symptoms Reported: Not assessed    Musculoskeletal Additional Musculoskeletal Details: no cane/walker - hold on to others as needed, denies falls Musculoskeletal Management Strategies: Routine screening Falls in the past year?: No Number of falls in past year: 1 or less Was there an injury with Fall?: No Fall Risk Category Calculator: 0 Patient Fall Risk Level: Low Fall Risk Fall risk Follow up: Education provided, Falls evaluation completed  Psychosocial  Psychosocial Symptoms Reported: Anxiety - if selected complete GAD, Difficulty concentrating, Anger, Irritability Additional Psychological Details: daughter reports issues with patient confusing slef, writes notes on different papers and struggles to keep track - declined to use planner daughter provided, states patient has been more argumentative, mean, patient also states daughter told her she was being mean, daughter with recent loss of partner, son and DIL going on vacation for several [place] and HHA currently out due to surgery - patient repeating self throughout visit, difficulty moving on to next topic, visit focus on her anxiety related to needing help though verbalized she is getting what she needs from family, agreed struggling with lack of control, not being able to do things herself and depending on onthers and will need to do this the rest of her life. after extensive discussion, family agreed to LCSW to discuss how to manage feelings of loss of control and anxiety- scheduled Behavioral Management Strategies: Support system Behavioral Health Self-Management Outcome: 2 (bad) Major Change/Loss/Stressor/Fears (CP): Death of a loved one Behaviors When Feeling Stressed/Fearful: loss of control over life - noted repeating, struggling to process info, requesting it be repeated multiple times      10/16/2024    PHQ2-9 Depression Screening   Little interest or pleasure in doing things    Feeling down, depressed, or hopeless    PHQ-2 - Total Score    Trouble falling or staying asleep, or sleeping too much    Feeling tired or having little energy    Poor appetite or overeating     Feeling bad about yourself - or that you are a failure or have let yourself or your family down    Trouble concentrating on things, such as reading the newspaper or watching television    Moving or speaking so  slowly that other people could have noticed.  Or the opposite - being so fidgety or restless that you have been  moving around a lot more than usual    Thoughts that you would be better off dead, or hurting yourself in some way    PHQ2-9 Total Score    If you checked off any problems, how difficult have these problems made it for you to do your work, take care of things at home, or get along with other people    Depression Interventions/Treatment      There were no vitals filed for this visit.    Medications Reviewed Today     Reviewed by Devra Lands, RN (Registered Nurse) on 10/16/24 at [phone]  Med List Status: <None>   Medication Order Taking? Sig Documenting Provider Last Dose Status Informant  acetaminophen  (TYLENOL ) 500 MG tablet [id number] Yes Take 1-2 tablets (500-1,000 mg total) by mouth every 8 (eight) hours as needed. Cleatus Arlyss RAMAN, MD  Active   diclofenac  Sodium (VOLTAREN ) 1 % GEL [id number] Yes Apply 2 g topically 4 (four) times daily as needed. Cleatus Arlyss RAMAN, MD  Active   Multiple Vitamins-Minerals (PRESERVISION AREDS 2+MULTI VIT PO) [id number] Yes Take 1 capsule by mouth in the morning and at bedtime. [provider]  Active Multiple Informants           Med Note MAYER ARLYSS RAMAN [place] Oct 19, 2021 12:51 PM)    Rivaroxaban  (XARELTO ) 15 MG TABS tablet [id number] Yes TAKE 1 TABLET (15 MG TOTAL) BY MOUTH DAILY WITH SUPPER Cleatus Arlyss RAMAN, MD  Active   TIADYLT  ER 120 MG 24 hr capsule [id number] Yes TAKE 1 CAPSULE BY MOUTH EVERY DAY Fernande Elspeth BROCKS, MD  Active             Recommendation:   PCP Follow-up Continue Current Plan of Care Speak with Social Worker for Anxiety 10/30/2024  Follow Up Plan:   Telephone follow-up in 1 month  Lands Devra, MSN, RN [facility]  [facility], [facility] RN Care Manager Direct Dial: [phone] Fax: [phone]

## 2024-10-16 NOTE — Patient Instructions (Signed)
 Visit Information  Thank you for taking time to visit with me today. Please don't hesitate to contact me if I can be of assistance to you before our next scheduled appointment.  Your next care management appointment is by telephone on 11/13/2024 at 9:00 am  Telephone follow-up in 1 month  Please call the care guide team at [phone] if you need to cancel, schedule, or reschedule an appointment.   Please call the Suicide and Crisis Lifeline: 988 call the [place]  National Suicide Prevention Lifeline: 878-307-6521 or TTY: 309 353 0745 TTY 360 765 2216) to talk to a trained counselor call 1-800-273-TALK (toll free, 24 hour hotline) go to [facility] [address], [place] [phone]) call 911 if you are experiencing a Mental Health or Behavioral HeMemory Compensation Strategies  Use WARM strategy.  W= write it down  A= associate it  R= repeat it  M= make a mental note  2.   You can keep a Glass Blower/designer.  Use a 3-ring notebook with sections for the following: calendar, important names and phone numbers,  medications, doctors' names/phone numbers, lists/reminders, and a section to journal what you did  each day.   3.    Use a calendar to write appointments down.  4.    Write yourself a schedule for the day.  This can be placed on the calendar or in a separate section of the Memory Notebook.  Keeping a  regular schedule can help memory.  5.    Use medication organizer with sections for each day or morning/evening pills.  You may need help loading it  6.    Keep a basket, or pegboard by the door.  Place items that you need to take out with you in the basket or on the pegboard.  You may also want to  include a message board for reminders.  7.    Use sticky notes.  Place sticky notes with reminders in a place where the task is performed.  For example:  turn off the  stove placed by the stove, lock the door placed on the door at eye  level,  take your medications on  the bathroom mirror or by the place where you normally take your medications.  8.    Use alarms/timers.  Use while cooking to remind yourself to check on food or as a reminder to take your medicine, or as a  reminder to make a call, or as a reminder to perform another task, etc. alth Crisis or need someone to talk to.  [name], MSN, RN [place]  [facility], [facility] RN Care Manager Direct Dial: [phone] Fax: [phone]  Managing Anxiety, Adult After being diagnosed with anxiety, you may be relieved to know why you have felt or behaved a certain way. You may also feel overwhelmed about the treatment ahead and what it will mean for your life. With care and support, you can manage your anxiety. How to manage lifestyle changes Understanding the difference between stress and anxiety Although stress can play a role in anxiety, it is not the same as anxiety. Stress is your body's reaction to life changes and events, both good and bad. Stress is often caused by something external, such as a deadline, test, or competition. It normally goes away after the event has ended and will last just a few hours. But, stress can be ongoing and can lead to more than just stress. Anxiety is caused by something internal, such as imagining a  terrible outcome or worrying that something will go wrong that will greatly upset you. Anxiety often does not go away even after the event is over, and it can become a long-term (chronic) worry. Lowering stress and anxiety Talk with your health care provider or a counselor to learn more about lowering anxiety and stress. They may suggest tension-reduction techniques, such as: Music. Spend time creating or listening to music that you enjoy and that inspires you. Mindfulness-based meditation. Practice being aware of your normal breaths while not trying to control your breathing. It can be done while sitting  or walking. Centering prayer. Focus on a word, phrase, or sacred image that means something to you and brings you peace. Deep breathing. Expand your stomach and inhale slowly through your nose. Hold your breath for 3-5 seconds. Then breathe out slowly, letting your stomach muscles relax. Self-talk. Learn to notice and spot thought patterns that lead to anxiety reactions. Change those patterns to thoughts that feel peaceful. Muscle relaxation. Take time to tense muscles and then relax them. Choose a tension-reduction technique that fits your lifestyle and personality. These techniques take time and practice. Set aside 5-15 minutes a day to do them. Specialized therapists can offer counseling and training in these techniques. The training to help with anxiety may be covered by some insurance plans. Other things you can do to manage stress and anxiety include: Keeping a stress diary. This can help you learn what triggers your reaction and then learn ways to manage your response. Thinking about how you react to certain situations. You may not be able to control everything, but you can control your response. Making time for activities that help you relax and not feeling guilty about spending your time in this way. Doing visual imagery. This involves imagining or creating mental pictures to help you relax. Practicing yoga. Through yoga poses, you can lower tension and relax.  Medicines Medicines for anxiety include: Antidepressant medicines. These are usually prescribed for long-term daily control. Anti-anxiety medicines. These may be added in severe cases, especially when panic attacks occur. When used together, medicines, psychotherapy, and tension-reduction techniques may be the most effective treatment. Relationships Relationships can play a big part in helping you recover. Spend more time connecting with trusted friends and family members. Think about going to couples counseling if you have a  partner, taking family education classes, or going to family therapy. Therapy can help you and others better understand your anxiety. How to recognize changes in your anxiety Everyone responds differently to treatment for anxiety. Recovery from anxiety happens when symptoms lessen and stop interfering with your daily life at home or work. This may mean that you will start to: Have better concentration and focus. Worry will interfere less in your daily thinking. Sleep better. Be less irritable. Have more energy. Have improved memory. Try to recognize when your condition is getting worse. Contact your provider if your symptoms interfere with home or work and you feel like your condition is not improving. Follow these instructions at home: Activity Exercise. Adults should: Exercise for at least 150 minutes each week. The exercise should increase your heart rate and make you sweat (moderate-intensity exercise). Do strengthening exercises at least twice a week. Get the right amount and quality of sleep. Most adults need 7-9 hours of sleep each night. Lifestyle  Eat a healthy diet that includes plenty of vegetables, fruits, whole grains, low-fat dairy products, and lean protein. Do not eat a lot of foods that are high in  fats, added sugars, or salt (sodium). Make choices that simplify your life. Do not use any products that contain nicotine or tobacco. These products include cigarettes, chewing tobacco, and vaping devices, such as e-cigarettes. If you need help quitting, ask your provider. Avoid caffeine, alcohol, and certain over-the-counter cold medicines. These may make you feel worse. Ask your pharmacist which medicines to avoid. General instructions Take over-the-counter and prescription medicines only as told by your provider. Keep all follow-up visits. This is to make sure you are managing your anxiety well or if you need more support. Where to find support You can get help and support  from: Self-help groups. Online and [employer]. A trusted spiritual leader. Couples counseling. Family education classes. Family therapy. Where to find more information You may find that joining a support group helps you deal with your anxiety. The following sources can help you find counselors or support groups near you: Mental Health America: mentalhealthamerica.net Anxiety and Depression Association of America (ADAA): adaa.org The [employer] on Mental Illness (NAMI): nami.org Contact a health care provider if: You have a hard time staying focused or finishing tasks. You spend many hours a day feeling worried about everyday life. You are very tired because you cannot stop worrying. You start to have headaches or often feel tense. You have chronic nausea or diarrhea. Get help right away if: Your heart feels like it is racing. You have shortness of breath. You have thoughts of hurting yourself or others. Get help right away if you feel like you may hurt yourself or others, or have thoughts about taking your own life. Go to your nearest emergency room or: Call 911. Call the National Suicide Prevention Lifeline at 507-255-6806 or 988. This is open 24 hours a day. Text the Crisis Text Line at 361-854-5993. This information is not intended to replace advice given to you by your health care provider. Make sure you discuss any questions you have with your health care provider. Document Revised: 07/10/2022 Document Reviewed: 01/22/2021 Elsevier Patient Education  2024 [employer].

## 2024-10-30 ENCOUNTER — Other Ambulatory Visit: Payer: Self-pay | Admitting: *Deleted

## 2024-10-31 NOTE — Patient Outreach (Signed)
 Complex Care Management   Visit Note  11/01/2024  Name:  Ashlee Warren MRN: [id number] DOB: 02/19/1928  Situation: Referral received for Complex Care Management related to Mental/Behavioral Health diagnosis Anxiety I obtained verbal consent from Patient.  Visit completed with Patient  on the phone on 10/30/24.  Background:   Past Medical History:  Diagnosis Date   Atrial fibrillation [facility])    Cataract    Dysrhythmia    afib   Essential hypertension     Assessment: Patient is a 89 year old female, currently resides in her own home. Confirms that daughter and and son are active with her care. Patient also confirms attending church and dinners with friends regularly. Patient states that she is doing well, denied need  for ongoing mental health counseling stating that she relies on spiritual practices to manage her anxiety.  Patient Reported Symptoms:  Cognitive Cognitive Status: Difficulties with attention and concentration Cognitive/Intellectual Conditions Management [RPT]: None reported or documented in medical history or problem list   Health Maintenance Behaviors: None Healing Pattern: Average Health Facilitated by: Healthy diet, Rest  Neurological Neurological Review of Symptoms: No symptoms reported    HEENT HEENT Symptoms Reported: No symptoms reported (wears hearing aids) HEENT Management Strategies: Routine screening HEENT Self-Management Outcome: 4 (good) HEENT Comment: Has eye doctor appointment on 11/10/24    Cardiovascular Cardiovascular Symptoms Reported: No symptoms reported    Respiratory Respiratory Symptoms Reported: Shortness of breath Other Respiratory Symptoms: shortnes of breath at times-I am not gasping for air, it is just not comfortable has a pacemaker Respiratory Management Strategies: Routine screening  Endocrine Endocrine Symptoms Reported: No symptoms reported Is patient diabetic?: No    Gastrointestinal Gastrointestinal Symptoms Reported: No  symptoms reported      Genitourinary Genitourinary Symptoms Reported: No symptoms reported    Integumentary Integumentary Symptoms Reported: No symptoms reported    Musculoskeletal Musculoskelatal Symptoms Reviewed: No symptoms reported        Psychosocial Psychosocial Symptoms Reported: Anxiety - if selected complete GAD            10/30/2024    3:21 PM 12/06/2023   11:47 AM 11/22/2022    4:15 PM  GAD 7 : Generalized Anxiety Score  Nervous, Anxious, on Edge 0 0 0  Control/stop worrying 0 2 0  Worry too much - different things 0  0  Trouble relaxing 0  0  Restless 0  0  Easily annoyed or irritable 0  0  Afraid - awful might happen 0  0  Total GAD 7 Score 0  0  Anxiety Difficulty Not difficult at all  Not difficult at all     11/01/2024    PHQ2-9 Depression Screening   Little interest or pleasure in doing things    Feeling down, depressed, or hopeless    PHQ-2 - Total Score    Trouble falling or staying asleep, or sleeping too much    Feeling tired or having little energy    Poor appetite or overeating     Feeling bad about yourself - or that you are a failure or have let yourself or your family down    Trouble concentrating on things, such as reading the newspaper or watching television    Moving or speaking so slowly that other people could have noticed.  Or the opposite - being so fidgety or restless that you have been moving around a lot more than usual    Thoughts that you would be better off dead, or  hurting yourself in some way    PHQ2-9 Total Score    If you checked off any problems, how difficult have these problems made it for you to do your work, take care of things at home, or get along with other people    Depression Interventions/Treatment    generalized anxiety disorder    There were no vitals filed for this visit. Pain Score: 0-No pain  Medications Reviewed Today     Reviewed by Ermalinda Lenn HERO, LCSW (Social Worker) on 10/30/24 at 1444  Med List  Status: <None>   Medication Order Taking? Sig Documenting Provider Last Dose Status Informant  acetaminophen  (TYLENOL ) 500 MG tablet [id number] Yes Take 1-2 tablets (500-1,000 mg total) by mouth every 8 (eight) hours as needed. Cleatus Arlyss RAMAN, MD  Active   diclofenac  Sodium (VOLTAREN ) 1 % GEL [id number] Yes Apply 2 g topically 4 (four) times daily as needed. Cleatus Arlyss RAMAN, MD  Active   Multiple Vitamins-Minerals (PRESERVISION AREDS 2+MULTI VIT PO) [id number] Yes Take 1 capsule by mouth in the morning and at bedtime. [provider]  Active Multiple Informants           Med Note MAYER ARLYSS RAMAN [place] Oct 19, 2021 12:51 PM)    Rivaroxaban  (XARELTO ) 15 MG TABS tablet [id number] Yes TAKE 1 TABLET (15 MG TOTAL) BY MOUTH DAILY WITH SUPPER Cleatus Arlyss RAMAN, MD  Active   TIADYLT  ER 120 MG 24 hr capsule [id number] Yes TAKE 1 CAPSULE BY MOUTH EVERY DAY Fernande Elspeth BROCKS, MD  Active             Recommendation:   PCP Follow-up Specialty provider follow-up as scheduled  Follow Up Plan:   Telephone follow up appointment date/time:  [facility] 11/13/24 at [facility], LCSW [place]  [facility], [facility] Licensed Clinical Social Worker  Direct Dial: [phone]

## 2024-11-01 NOTE — Patient Instructions (Signed)
 Visit Information  Thank you for taking time to visit with me today. Please don't hesitate to contact me if I can be of assistance to you before our next scheduled appointment.  Your next care management appointment is by telephone on 11/13/24 at 9am with [facility]   Please call the care guide team at [phone] if you need to cancel, schedule, or reschedule an appointment.   Please call the Suicide and Crisis Lifeline: 988 call the [place]  National Suicide Prevention Lifeline: 352-828-4234 or TTY: (901)580-6535 TTY 5161596987) to talk to a trained counselor call 1-800-273-TALK (toll free, 24 hour hotline) call 911 if you are experiencing a Mental Health or Behavioral Health Crisis or need someone to talk to.  Sareena Odeh, LCSW [place]  [facility], [facility] Licensed Clinical Social Worker  Direct Dial: [phone]

## 2024-11-10 ENCOUNTER — Ambulatory Visit: Payer: Medicare Other

## 2024-11-10 DIAGNOSIS — I442 Atrioventricular block, complete: Secondary | ICD-10-CM

## 2024-11-11 LAB — CUP PACEART REMOTE DEVICE CHECK
Battery Remaining Longevity: 123 mo
Battery Voltage: 3.01 V
Brady Statistic RV Percent Paced: 99.68 %
Date Time Interrogation Session: 20260127171536
Implantable Lead Connection Status: 753985
Implantable Lead Implant Date: 20221028
Implantable Lead Location: 753860
Implantable Lead Model: 1948
Implantable Pulse Generator Implant Date: 20221028
Lead Channel Impedance Value: 418 Ohm
Lead Channel Impedance Value: 551 Ohm
Lead Channel Pacing Threshold Amplitude: 0.625 V
Lead Channel Pacing Threshold Pulse Width: 0.4 ms
Lead Channel Sensing Intrinsic Amplitude: 11.875 mV
Lead Channel Sensing Intrinsic Amplitude: 11.875 mV
Lead Channel Setting Pacing Amplitude: 2 V
Lead Channel Setting Pacing Pulse Width: 0.4 ms
Lead Channel Setting Sensing Sensitivity: 0.9 mV
Zone Setting Status: 755011

## 2024-11-12 ENCOUNTER — Ambulatory Visit: Payer: Self-pay | Admitting: Cardiology

## 2024-11-13 ENCOUNTER — Telehealth

## 2024-11-19 NOTE — Progress Notes (Signed)
 Remote PPM Transmission

## 2024-11-26 ENCOUNTER — Telehealth

## 2025-01-15 ENCOUNTER — Ambulatory Visit

## 2025-01-21 ENCOUNTER — Encounter: Admitting: Family Medicine

## 2025-01-21 ENCOUNTER — Ambulatory Visit

## 2025-02-09 ENCOUNTER — Ambulatory Visit: Payer: Medicare Other

## 2025-02-23 ENCOUNTER — Ambulatory Visit: Admitting: Internal Medicine

## 2025-05-11 ENCOUNTER — Ambulatory Visit: Payer: Medicare Other

## 2025-08-10 ENCOUNTER — Ambulatory Visit: Payer: Medicare Other
